# Patient Record
Sex: Male | Born: 1950 | Race: White | Hispanic: No | Marital: Married | Smoking: Former smoker
Health system: Southern US, Community
[De-identification: ages and names within clinical notes are randomized; demographics above are authoritative.]

## PROBLEM LIST (undated history)

## (undated) DIAGNOSIS — I10 Essential (primary) hypertension: Secondary | ICD-10-CM

## (undated) DIAGNOSIS — I4891 Unspecified atrial fibrillation: Secondary | ICD-10-CM

## (undated) DIAGNOSIS — I495 Sick sinus syndrome: Secondary | ICD-10-CM

## (undated) DIAGNOSIS — Z8709 Personal history of other diseases of the respiratory system: Secondary | ICD-10-CM

## (undated) DIAGNOSIS — N189 Chronic kidney disease, unspecified: Secondary | ICD-10-CM

## (undated) DIAGNOSIS — C4490 Unspecified malignant neoplasm of skin, unspecified: Secondary | ICD-10-CM

## (undated) DIAGNOSIS — Z9289 Personal history of other medical treatment: Secondary | ICD-10-CM

## (undated) DIAGNOSIS — N281 Cyst of kidney, acquired: Secondary | ICD-10-CM

## (undated) DIAGNOSIS — J45909 Unspecified asthma, uncomplicated: Secondary | ICD-10-CM

## (undated) DIAGNOSIS — H3530 Unspecified macular degeneration: Secondary | ICD-10-CM

## (undated) DIAGNOSIS — M5136 Other intervertebral disc degeneration, lumbar region: Secondary | ICD-10-CM

## (undated) DIAGNOSIS — E119 Type 2 diabetes mellitus without complications: Secondary | ICD-10-CM

## (undated) DIAGNOSIS — M109 Gout, unspecified: Secondary | ICD-10-CM

## (undated) DIAGNOSIS — Z950 Presence of cardiac pacemaker: Secondary | ICD-10-CM

## (undated) DIAGNOSIS — K219 Gastro-esophageal reflux disease without esophagitis: Secondary | ICD-10-CM

## (undated) DIAGNOSIS — I499 Cardiac arrhythmia, unspecified: Secondary | ICD-10-CM

## (undated) DIAGNOSIS — E7800 Pure hypercholesterolemia, unspecified: Secondary | ICD-10-CM

## (undated) DIAGNOSIS — M51369 Other intervertebral disc degeneration, lumbar region without mention of lumbar back pain or lower extremity pain: Secondary | ICD-10-CM

## (undated) DIAGNOSIS — R911 Solitary pulmonary nodule: Secondary | ICD-10-CM

## (undated) DIAGNOSIS — M5126 Other intervertebral disc displacement, lumbar region: Secondary | ICD-10-CM

## (undated) DIAGNOSIS — Z8719 Personal history of other diseases of the digestive system: Secondary | ICD-10-CM

## (undated) DIAGNOSIS — I739 Peripheral vascular disease, unspecified: Secondary | ICD-10-CM

## (undated) HISTORY — PX: CATARACT EXTRACTION, BILATERAL: SHX1313

## (undated) HISTORY — DX: Unspecified atrial fibrillation: I48.91

## (undated) HISTORY — PX: WRIST SURGERY: SHX841

## (undated) HISTORY — DX: Essential (primary) hypertension: I10

## (undated) HISTORY — PX: MELANOMA EXCISION: SHX5266

## (undated) HISTORY — DX: Solitary pulmonary nodule: R91.1

## (undated) HISTORY — DX: Type 2 diabetes mellitus without complications: E11.9

## (undated) HISTORY — PX: LACERATION REPAIR: SHX5168

## (undated) HISTORY — DX: Sick sinus syndrome: I49.5

---

## 2005-05-05 ENCOUNTER — Ambulatory Visit: Payer: Self-pay | Admitting: Cardiology

## 2005-06-03 ENCOUNTER — Ambulatory Visit: Payer: Self-pay | Admitting: Cardiology

## 2005-11-11 ENCOUNTER — Ambulatory Visit: Payer: Self-pay | Admitting: Cardiology

## 2012-01-11 HISTORY — PX: ESOPHAGEAL DILATION: SHX303

## 2012-02-02 ENCOUNTER — Other Ambulatory Visit: Payer: Self-pay | Admitting: Physician Assistant

## 2012-02-03 ENCOUNTER — Encounter: Payer: Self-pay | Admitting: Cardiology

## 2012-02-03 DIAGNOSIS — R55 Syncope and collapse: Secondary | ICD-10-CM

## 2012-02-07 ENCOUNTER — Other Ambulatory Visit: Payer: Self-pay | Admitting: *Deleted

## 2012-02-07 ENCOUNTER — Telehealth: Payer: Self-pay | Admitting: *Deleted

## 2012-02-07 DIAGNOSIS — R55 Syncope and collapse: Secondary | ICD-10-CM

## 2012-02-07 NOTE — Telephone Encounter (Signed)
Address,phone numbers and insurance information verified with patient.

## 2012-02-07 NOTE — Telephone Encounter (Signed)
Left message for patient to call office to verify insurance/address information for 21 day monitor.

## 2012-02-09 DIAGNOSIS — R55 Syncope and collapse: Secondary | ICD-10-CM

## 2012-02-29 ENCOUNTER — Encounter: Payer: Self-pay | Admitting: Cardiology

## 2012-03-02 ENCOUNTER — Telehealth: Payer: Self-pay | Admitting: *Deleted

## 2012-03-02 ENCOUNTER — Ambulatory Visit (INDEPENDENT_AMBULATORY_CARE_PROVIDER_SITE_OTHER): Payer: BC Managed Care – PPO | Admitting: Physician Assistant

## 2012-03-02 ENCOUNTER — Encounter: Payer: Self-pay | Admitting: Physician Assistant

## 2012-03-02 VITALS — BP 144/82 | HR 86 | Ht 73.0 in | Wt 204.0 lb

## 2012-03-02 DIAGNOSIS — I4891 Unspecified atrial fibrillation: Secondary | ICD-10-CM

## 2012-03-02 DIAGNOSIS — R911 Solitary pulmonary nodule: Secondary | ICD-10-CM

## 2012-03-02 DIAGNOSIS — I10 Essential (primary) hypertension: Secondary | ICD-10-CM

## 2012-03-02 DIAGNOSIS — E119 Type 2 diabetes mellitus without complications: Secondary | ICD-10-CM

## 2012-03-02 NOTE — Progress Notes (Signed)
HPI: Patient presents for post hospital followup from [facility], following recent evaluation by myself/Dr. Andee Lineman for symptomatic bradycardia and near syncope. He presented with AF CVR, with prior history of PAF, and no known CAD. He a negative GXT echo 2003, and had never undergone cardiac catheterization. He was on chronic Coumadin, followed by Dr. Olena Leatherwood. He presented with reported heart rate of 42 BPM, in Dr. Bartholomew Crews office, but with heart rate 67 on admission. We recommended discontinuation of Lopressor and diltiazem, with outpatient 21 day event monitor and. A 2-D echo was done prior to discharge, revealing normal LVF (EF 55-60%), with no focal WMAs, mild/moderate LVD, and mild MR.  Current review of continuous CardioNet monitor rhythm strips reveals persistent AF with rates as high as 170 bpm, but no bradycardia or significant pauses.  Clinically, he reports less frequent episodes of near-syncope, but still has frequent palpitations. However, he has not had to use diltiazem 30 mg as needed, per Dr. Margarita Mail recent instructions. He continues to experience easy fatigability and diminished energy, but with no associated CP. As noted, he has no known CAD. He denies any frank syncopal episodes, but still has occasional lightheadedness.  EKG today indicates atrial fibrillation at approximately 90 bpm.  Allergies  Allergen Reactions  . Pseudoephedrine   . Shellfish Allergy     Current Outpatient Prescriptions  Medication Sig Dispense Refill  . allopurinol (ZYLOPRIM) 300 MG tablet Take 300 mg by mouth daily.      Marland Kitchen amLODipine-olmesartan (AZOR) 5-20 MG per tablet Take 1 tablet by mouth daily.      Marland Kitchen atorvastatin (LIPITOR) 10 MG tablet Take 10 mg by mouth daily.      . benazepril-hydrochlorthiazide (LOTENSIN HCT) 20-12.5 MG per tablet Take 1 tablet by mouth daily.      . Cinnamon 500 MG TABS Take 2 tablets by mouth daily.      Marland Kitchen diltiazem (CARDIZEM) 30 MG tablet Take 1 tablet by mouth as needed.        Marland Kitchen glipiZIDE (GLUCOTROL XL) 2.5 MG 24 hr tablet Take 2.5 mg by mouth daily.      . metFORMIN (GLUCOPHAGE) 500 MG tablet Take 500 mg by mouth daily with breakfast.      . Omega-3 Fatty Acids (FISH OIL) 1000 MG CAPS Take 2 capsules by mouth daily.      . pantoprazole (PROTONIX) 20 MG tablet Take 20 mg by mouth daily.      Marland Kitchen warfarin (COUMADIN) 5 MG tablet Take 5 mg by mouth daily. As Directed.        Past Medical History  Diagnosis Date  . Bradycardia   . Atrial fibrillation   . Type 2 diabetes mellitus   . HTN (hypertension)   . Gout   . Pulmonary nodule     Past Surgical History  Procedure Date  . Left arm surgery     History   Social History  . Marital Status: Married    Spouse Name: N/A    Number of Children: N/A  . Years of Education: N/A   Occupational History  . Not on file.   Social History Main Topics  . Smoking status: Former Smoker -- 1.0 packs/day for 3 years    Types: Cigarettes    Quit date: 09/13/1991  . Smokeless tobacco: Former Neurosurgeon    Types: Chew    Quit date: 09/12/1996  . Alcohol Use: No  . Drug Use: Not on file  . Sexually Active: Not on file   Other  Topics Concern  . Not on file   Social History Narrative  . No narrative on file   Social History Narrative  . No narrative on file    Problem Relation Age of Onset  . Lung cancer Father   . Ulcers Mother     ROS: no nausea, vomiting; no fever, chills; no melena, hematochezia; no claudication  PHYSICAL EXAM: BP 144/82  Pulse 86  Ht 6\' 1"  (1.854 m)  Wt 204 lb (92.534 kg)  BMI 26.91 kg/m2 GENERAL: 61 year-old male, sitting upright; NAD HEENT: NCAT, PERRLA, EOMI; sclera clear; no xanthelasma NECK: palpable bilateral carotid pulses, no bruits; no JVD; no TM LUNGS: CTA bilaterally CARDIAC: Irregularly irregular (S1, S2); no significant murmurs; no rubs or gallops ABDOMEN: soft, non-tender; intact BS EXTREMETIES: intact distal pulses; no significant peripheral edema SKIN: warm/dry; no  obvious rash/lesions MUSCULOSKELETAL: no joint deformity NEURO: no focal deficit; NL affect   EKG: reviewed and available in Electronic Records   ASSESSMENT & PLAN:  Atrial fibrillation 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 Of note, however, we do not know the duration of his atrial fibrillation, but that he did present with history of PAF. Clearly, he has remained in A. fib since his recent hospitalization on May 23. Since we discontinued both beta blocker and calcium channel blocker, he has demonstrated persistently elevated heart rates up to 170 bpm. He currently has normal LVF, but is at risk for tachycardia mediated cardiomyopathy. Therefore, it would be preferable to cardiovert him back in to NSR. Following consultation with Dr. Andee Lineman, and with the patient, the plan is to refer him to our EP team for early evaluation, regarding feasibility and timing possible cardioversion as well as permanent pacemaker implantation, followed by rate controlling agents. As noted, patient is on chronic Coumadin, followed by Dr. Olena Leatherwood. In the interim, patient is to continue with our plan to use diltiazem 30 mg only on a prn basis, for symptomatic tachycardia palpitations.   HTN (hypertension) Followed by primary M.D.  Type 2 diabetes mellitus Followed by primary M.D.  Pulmonary nodule Patient has been advised to followup with Dr. Olena Leatherwood regarding further workup.    Gene Kyana Aicher, PAC

## 2012-03-02 NOTE — Assessment & Plan Note (Signed)
Followed by primary M.D. 

## 2012-03-02 NOTE — Patient Instructions (Signed)
   Referral to EP for evaluation for pacemaker Follow up in  1 month

## 2012-03-02 NOTE — Assessment & Plan Note (Signed)
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 Of note, however, we do not know the duration of his atrial fibrillation, but that he did present with history of PAF. Clearly, he has remained in A. fib since his recent hospitalization on May 23. Since we discontinued both beta blocker and calcium channel blocker, he has demonstrated persistently elevated heart rates up to 170 bpm. He currently has normal LVF, but is at risk for tachycardia mediated cardiomyopathy. Therefore, it would be preferable to cardiovert him back in to NSR. Following consultation with Dr. Andee Lineman, and with the patient, the plan is to refer him to our EP team for early evaluation, regarding feasibility and timing possible cardioversion as well as permanent pacemaker implantation, followed by rate controlling agents. As noted, patient is on chronic Coumadin, followed by Dr. Olena Leatherwood. In the interim, patient is to continue with our plan to use diltiazem 30 mg only on a prn basis, for symptomatic tachycardia palpitations.

## 2012-03-02 NOTE — Assessment & Plan Note (Signed)
Patient has been advised to followup with Dr. Olena Leatherwood regarding further workup.

## 2012-03-02 NOTE — Telephone Encounter (Signed)
Referral to EP for evaluation for pacemaker Per check out from [place] office today. Mr. Duggar saw Gene Serpe PA-C. Gene and Dr. Andee Lineman are requesting That Mr. Cundari be seen next week.  I spoke with Marlowe Kays and she advised check with you.

## 2012-03-07 NOTE — Telephone Encounter (Signed)
Per Dr Paul Dunn add on to his schedule in [place] on 03/29/12

## 2012-03-14 NOTE — Telephone Encounter (Signed)
[facility]ach[facility] T More Detail >>      Megan Salon        Sent: Wed March 14, 2012 12:23 PM    To: Lesle Chris, LPN        SYED ZUKAS    MRN: [id number] DOB: 06-Apr-1951     Pt Work: [phone] Pt Home: [phone]           Message     Yes patient has been notified of his upcoming appointment    With Dr. Johney Frame.

## 2012-03-14 NOTE — Telephone Encounter (Signed)
Has this patient been notified. °

## 2012-03-29 ENCOUNTER — Encounter: Payer: Self-pay | Admitting: *Deleted

## 2012-03-29 ENCOUNTER — Other Ambulatory Visit: Payer: Self-pay | Admitting: *Deleted

## 2012-03-29 ENCOUNTER — Encounter: Payer: Self-pay | Admitting: Internal Medicine

## 2012-03-29 ENCOUNTER — Encounter (HOSPITAL_COMMUNITY): Payer: Self-pay | Admitting: Pharmacy Technician

## 2012-03-29 ENCOUNTER — Ambulatory Visit (INDEPENDENT_AMBULATORY_CARE_PROVIDER_SITE_OTHER): Payer: BC Managed Care – PPO | Admitting: Internal Medicine

## 2012-03-29 VITALS — BP 128/83 | HR 78 | Ht 73.0 in | Wt 201.1 lb

## 2012-03-29 DIAGNOSIS — I10 Essential (primary) hypertension: Secondary | ICD-10-CM

## 2012-03-29 DIAGNOSIS — I495 Sick sinus syndrome: Secondary | ICD-10-CM

## 2012-03-29 DIAGNOSIS — I4891 Unspecified atrial fibrillation: Secondary | ICD-10-CM

## 2012-03-29 NOTE — Patient Instructions (Addendum)
Your physician has recommended that you have a pacemaker inserted. A pacemaker is a small device that is placed under the skin of your chest or abdomen to help control abnormal heart rhythms. This device uses electrical pulses to prompt the heart to beat at a normal rate. Pacemakers are used to treat heart rhythms that are too slow. Wire (leads) are attached to the pacemaker that goes into the chambers of you heart. This is done in the hospital and usually requires and overnight stay. Please see the instruction sheet given to you today for more information. You will receive a call from [place] to inform you about the specifics for this procedure. Your physician recommends that you continue on your current medications as directed. Please refer to the Current Medication list given to you today.

## 2012-04-02 ENCOUNTER — Encounter: Payer: Self-pay | Admitting: Internal Medicine

## 2012-04-02 DIAGNOSIS — I495 Sick sinus syndrome: Secondary | ICD-10-CM | POA: Insufficient documentation

## 2012-04-02 NOTE — Progress Notes (Signed)
Primary Care Physician: Toma Deiters, MD Referring Physician:  Dr Valeria Batman is a 61 y.o. male with a h/o persistent atrial fibrillation who presents today for EP consultation.  He has a longstanding history of atrial fibrillation for which he has been chronically anticoagulation with coumadin.  He reports initially presenting for routine evaluation and found to have afib.  Because he was asymptomatic, he was placed on metoprolol and diltiazem for rate control.  He did well with rate control for a long time, without any concerns.  He reports that he has been able to remain very active.  Over the past few months, he has developed symptoms of fatigue and dizziness.  He reports having presyncope.  He checked his heart rate and found it to be 40s.  He therefore presented to Dr Bartholomew Crews office and was confirmed to have afib with heart rates of 42 bpm.  He was therefore admitted to [facility] for further management.  His metoprolol and diltiazem were discontinued by Dr Andee Lineman.  He was discharged with an outpatient 21 day event monitor. He had a 2-D echo was done prior to discharge, revealing normal LVF (EF 55-60%), with no focal WMAs, mild/moderate LVD, and mild MR.  Since discharge, he reports frequent tachypalpitations and worsening fatigue.  His event monitor reveals afib with ventricular rates as high as 170 bpm, but no bradycardia or significant pauses off of AV nodal agents.  His dizziness and presyncope have also resolved off of AV nodal agents.  He has been instructed to use diltiazem 30 mg as needed and is referred to me for further assessment of tachycardia/bradycardia syndrome.   Today, he denies symptoms of chest pain, shortness of breath, orthopnea, PND, lower extremity edema, further dizziness, presyncope, syncope, or neurologic sequela. The patient is tolerating medications without difficulties and is otherwise without complaint today.   Past Medical History    Diagnosis Date  . Tachycardia-bradycardia syndrome   . Atrial fibrillation     long standing persistent  . Type 2 diabetes mellitus   . HTN (hypertension)   . Gout   . Pulmonary nodule    Past Surgical History  Procedure Date  . Left arm surgery     Current Outpatient Prescriptions  Medication Sig Dispense Refill  . allopurinol (ZYLOPRIM) 300 MG tablet Take 300 mg by mouth daily.      Marland Kitchen amLODipine-olmesartan (AZOR) 5-20 MG per tablet Take 1 tablet by mouth daily.      Marland Kitchen atorvastatin (LIPITOR) 10 MG tablet Take 10 mg by mouth daily.      . benazepril-hydrochlorthiazide (LOTENSIN HCT) 20-12.5 MG per tablet Take 1 tablet by mouth daily.      . Cinnamon 500 MG TABS Take 2 tablets by mouth daily.      Marland Kitchen diltiazem (CARDIZEM) 30 MG tablet Take 1 tablet by mouth daily.       Marland Kitchen glipiZIDE (GLUCOTROL XL) 2.5 MG 24 hr tablet Take 2.5 mg by mouth daily.      . metFORMIN (GLUCOPHAGE) 500 MG tablet Take 500 mg by mouth daily with breakfast.      . Omega-3 Fatty Acids (FISH OIL) 1000 MG CAPS Take 2 capsules by mouth daily.      . pantoprazole (PROTONIX) 20 MG tablet Take 20 mg by mouth daily.      Marland Kitchen warfarin (COUMADIN) 5 MG tablet Take 5 mg by mouth daily. As Directed.        Allergies  Allergen Reactions  .  Pseudoephedrine Other (See Comments)    Unknown    . Shellfish Allergy Nausea And Vomiting    History   Social History  . Marital Status: Married    Spouse Name: N/A    Number of Children: N/A  . Years of Education: N/A   Occupational History  . Not on file.   Social History Main Topics  . Smoking status: Former Smoker -- 1.0 packs/day for 3 years    Types: Cigarettes    Quit date: 09/13/1991  . Smokeless tobacco: Former Neurosurgeon    Types: Chew    Quit date: 09/12/1996  . Alcohol Use: No  . Drug Use: No  . Sexually Active: Not on file   Other Topics Concern  . Not on file   Social History Narrative   Lives with wife in [place].  He is the [employer] of [employer]    Family History  Problem Relation Age of Onset  . Lung cancer Father   . Ulcers Mother     ROS- All systems are reviewed and negative except as per the HPI above  Physical Exam: Filed Vitals:   03/29/12 1117  BP: 128/83  Pulse: 78  Height: 6\' 1"  (1.854 m)  Weight: 201 lb 1.9 oz (91.227 kg)  SpO2: 99%    GEN- The patient is well appearing, alert and oriented x 3 today.   Head- normocephalic, atraumatic Eyes-  Sclera clear, conjunctiva pink Ears- hearing intact Oropharynx- clear Neck- supple, Lungs- Clear to ausculation bilaterally, normal work of breathing Heart- irregular rate and rhythm, no murmurs, rubs or gallops, PMI not laterally displaced GI- soft, NT, ND, + BS Extremities- no clubbing, cyanosis, or edema MS- no significant deformity or atrophy Skin- no rash or lesion Psych- euthymic mood, full affect Neuro- strength and sensation are intact  EKG today reveals afib, V rate 78 bpm, QRS 90 mse, Qtc 401 Event monitor is reviewed  Assessment and Plan:

## 2012-04-02 NOTE — Assessment & Plan Note (Signed)
As above.

## 2012-04-02 NOTE — Assessment & Plan Note (Signed)
The patient has persistent atrial fibrillation.  He has been asymptomatic with afib previously, but has recently begun having difficulty with tachycardia/ bradycardia syndrome.  He is appropriately anticoagulated with coumadin (CHADS2 score is at least 2).  Unfortunately with recent symptomatic bradycardia his AV nodal agents have had to be discontinued.  Now off of these medicines, he has high heart rates documented with palpitations, fatigue,and decreased exercise tolerance. Therapeutic strategies for afib including rate and rhythm control were discussed in detail with the patient today.  As he has done well for so long with rate control, I suspect that this may be a reasonable strategy long term.  We must however achieve appropriate rate control.  Currently, our ability to rate control this patient is limited by symptomatic bradycardia.  I would therefore recommend pacemaker implantation at this time.    Risks, benefits, alternatives to pacemaker implantation were discussed in detail with the patient today. The patient understands that the risks include but are not limited to bleeding, infection, pneumothorax, perforation, tamponade, vascular damage, renal failure, MI, stroke, death,  and lead dislodgement and wishes to proceed. We will therefore schedule the procedure at the next available time.   I will plan cardioversion at the time of pacemaker implantation.  We will see how long he maintains sinus rhythm and also assess his symptoms in sinus.  If he feels much better with sinus, then we may be aggressive in our pursuit of sinus rhythm long term.  If he returns to afib but with less symptoms with adequate rate control, then we may opt to use his prior AV nodal regimen long term.

## 2012-04-02 NOTE — Assessment & Plan Note (Signed)
Stable No change required today  

## 2012-04-03 ENCOUNTER — Telehealth: Payer: Self-pay

## 2012-04-03 NOTE — Telephone Encounter (Signed)
Checking percert for Cath set for 04-12-2012

## 2012-04-03 NOTE — Telephone Encounter (Signed)
No precert required for [employer] 

## 2012-04-05 ENCOUNTER — Other Ambulatory Visit: Payer: Self-pay | Admitting: *Deleted

## 2012-04-05 DIAGNOSIS — R0602 Shortness of breath: Secondary | ICD-10-CM

## 2012-04-05 DIAGNOSIS — Z01810 Encounter for preprocedural cardiovascular examination: Secondary | ICD-10-CM

## 2012-04-05 DIAGNOSIS — I10 Essential (primary) hypertension: Secondary | ICD-10-CM

## 2012-04-05 DIAGNOSIS — I4891 Unspecified atrial fibrillation: Secondary | ICD-10-CM

## 2012-04-05 LAB — PROTIME-INR

## 2012-04-11 MED ORDER — SODIUM CHLORIDE 0.9 % IJ SOLN: 3.0000 mL | Freq: Two times a day (BID) | INTRAMUSCULAR | Status: DC

## 2012-04-11 MED ORDER — CEFAZOLIN SODIUM-DEXTROSE 2-3 GM-% IV SOLR
2.0000 g | INTRAVENOUS | Status: DC
  Filled 2012-04-11 (×2): qty 50

## 2012-04-11 MED ORDER — SODIUM CHLORIDE 0.9 % IR SOLN
80.0000 mg | Status: DC
  Filled 2012-04-11 (×2): qty 2

## 2012-04-11 MED ORDER — CHLORHEXIDINE GLUCONATE 4 % EX LIQD
60.0000 mL | Freq: Once | CUTANEOUS | Status: DC
  Filled 2012-04-11: qty 60

## 2012-04-11 MED ORDER — SODIUM CHLORIDE 0.9 % IV SOLN: 250.0000 mL | INTRAVENOUS | Status: DC

## 2012-04-11 MED ORDER — SODIUM CHLORIDE 0.45 % IV SOLN
INTRAVENOUS | Status: DC
  Administered 2012-04-12: 50 mL/h via INTRAVENOUS

## 2012-04-11 MED ORDER — SODIUM CHLORIDE 0.9 % IJ SOLN: 3.0000 mL | INTRAMUSCULAR | Status: DC | PRN

## 2012-04-12 ENCOUNTER — Encounter (HOSPITAL_COMMUNITY): Payer: Self-pay | Admitting: General Practice

## 2012-04-12 ENCOUNTER — Encounter (HOSPITAL_COMMUNITY)
Admission: RE | Disposition: A | Discharge status: Home / Self Care | Payer: Self-pay | Source: Ambulatory Visit | Attending: Internal Medicine

## 2012-04-12 ENCOUNTER — Ambulatory Visit (HOSPITAL_COMMUNITY)
Admission: RE | Admit: 2012-04-12 | Discharge: 2012-04-13 | Disposition: A | Discharge status: Home / Self Care | Payer: BC Managed Care – PPO | Source: Ambulatory Visit | Attending: Internal Medicine | Admitting: Internal Medicine

## 2012-04-12 ENCOUNTER — Ambulatory Visit: Payer: BC Managed Care – PPO | Admitting: Cardiology

## 2012-04-12 DIAGNOSIS — I495 Sick sinus syndrome: Secondary | ICD-10-CM | POA: Insufficient documentation

## 2012-04-12 DIAGNOSIS — I4891 Unspecified atrial fibrillation: Secondary | ICD-10-CM | POA: Insufficient documentation

## 2012-04-12 DIAGNOSIS — M109 Gout, unspecified: Secondary | ICD-10-CM | POA: Insufficient documentation

## 2012-04-12 DIAGNOSIS — I10 Essential (primary) hypertension: Secondary | ICD-10-CM | POA: Insufficient documentation

## 2012-04-12 DIAGNOSIS — Z79899 Other long term (current) drug therapy: Secondary | ICD-10-CM | POA: Insufficient documentation

## 2012-04-12 DIAGNOSIS — E119 Type 2 diabetes mellitus without complications: Secondary | ICD-10-CM | POA: Insufficient documentation

## 2012-04-12 DIAGNOSIS — Z7901 Long term (current) use of anticoagulants: Secondary | ICD-10-CM | POA: Insufficient documentation

## 2012-04-12 DIAGNOSIS — Z7902 Long term (current) use of antithrombotics/antiplatelets: Secondary | ICD-10-CM | POA: Insufficient documentation

## 2012-04-12 HISTORY — DX: Pure hypercholesterolemia, unspecified: E78.00

## 2012-04-12 HISTORY — PX: PACEMAKER INSERTION: SHX728

## 2012-04-12 HISTORY — DX: Unspecified asthma, uncomplicated: J45.909

## 2012-04-12 HISTORY — DX: Gastro-esophageal reflux disease without esophagitis: K21.9

## 2012-04-12 HISTORY — DX: Cyst of kidney, acquired: N28.1

## 2012-04-12 HISTORY — DX: Personal history of other medical treatment: Z92.89

## 2012-04-12 HISTORY — PX: PERMANENT PACEMAKER INSERTION: SHX5480

## 2012-04-12 HISTORY — DX: Personal history of other diseases of the respiratory system: Z87.09

## 2012-04-12 HISTORY — DX: Peripheral vascular disease, unspecified: I73.9

## 2012-04-12 HISTORY — DX: Gout, unspecified: M10.9

## 2012-04-12 HISTORY — DX: Personal history of other diseases of the digestive system: Z87.19

## 2012-04-12 LAB — GLUCOSE, CAPILLARY: Glucose-Capillary: 117 mg/dL — ABNORMAL HIGH (ref 70–99)

## 2012-04-12 SURGERY — PERMANENT PACEMAKER INSERTION
Anesthesia: LOCAL

## 2012-04-12 MED ORDER — FENTANYL CITRATE 0.05 MG/ML IJ SOLN
INTRAMUSCULAR | Status: AC
  Filled 2012-04-12: qty 2

## 2012-04-12 MED ORDER — AMLODIPINE BESYLATE 5 MG PO TABS
5.0000 mg | ORAL_TABLET | Freq: Every day | ORAL | Status: DC
  Administered 2012-04-13: 09:00:00 5 mg via ORAL
  Filled 2012-04-12: qty 1

## 2012-04-12 MED ORDER — GLIPIZIDE ER 2.5 MG PO TB24
2.5000 mg | ORAL_TABLET | Freq: Every day | ORAL | Status: DC
  Administered 2012-04-13: 2.5 mg via ORAL
  Filled 2012-04-12 (×2): qty 1

## 2012-04-12 MED ORDER — CEFAZOLIN SODIUM 1-5 GM-% IV SOLN
1.0000 g | Freq: Four times a day (QID) | INTRAVENOUS | Status: AC
  Administered 2012-04-12 – 2012-04-13 (×3): 1 g via INTRAVENOUS
  Filled 2012-04-12 (×3): qty 50

## 2012-04-12 MED ORDER — AMLODIPINE-OLMESARTAN 5-20 MG PO TABS: 1.0000 | ORAL_TABLET | Freq: Every day | ORAL | Status: DC

## 2012-04-12 MED ORDER — ATORVASTATIN CALCIUM 10 MG PO TABS
10.0000 mg | ORAL_TABLET | Freq: Every day | ORAL | Status: DC
  Filled 2012-04-12 (×2): qty 1

## 2012-04-12 MED ORDER — SODIUM CHLORIDE 0.9 % IV SOLN: 250.0000 mL | INTRAVENOUS | Status: DC | PRN

## 2012-04-12 MED ORDER — SODIUM CHLORIDE 0.9 % IJ SOLN: 3.0000 mL | Freq: Two times a day (BID) | INTRAMUSCULAR | Status: DC

## 2012-04-12 MED ORDER — PANTOPRAZOLE SODIUM 20 MG PO TBEC
20.0000 mg | DELAYED_RELEASE_TABLET | Freq: Every day | ORAL | Status: DC
  Administered 2012-04-13: 09:00:00 20 mg via ORAL
  Filled 2012-04-12: qty 1

## 2012-04-12 MED ORDER — ONDANSETRON HCL 4 MG/2ML IJ SOLN: 4.0000 mg | Freq: Four times a day (QID) | INTRAMUSCULAR | Status: DC | PRN

## 2012-04-12 MED ORDER — ACETAMINOPHEN 325 MG PO TABS: 325.0000 mg | ORAL_TABLET | ORAL | Status: DC | PRN

## 2012-04-12 MED ORDER — SODIUM CHLORIDE 0.9 % IJ SOLN: 3.0000 mL | INTRAMUSCULAR | Status: DC | PRN

## 2012-04-12 MED ORDER — LIDOCAINE HCL (PF) 1 % IJ SOLN
INTRAMUSCULAR | Status: AC
  Filled 2012-04-12: qty 60

## 2012-04-12 MED ORDER — MIDAZOLAM HCL 5 MG/5ML IJ SOLN
INTRAMUSCULAR | Status: AC
  Filled 2012-04-12: qty 5

## 2012-04-12 MED ORDER — METFORMIN HCL 500 MG PO TABS
500.0000 mg | ORAL_TABLET | Freq: Every day | ORAL | Status: DC
  Administered 2012-04-13: 09:00:00 500 mg via ORAL
  Filled 2012-04-12 (×2): qty 1

## 2012-04-12 MED ORDER — MUPIROCIN 2 % EX OINT
TOPICAL_OINTMENT | Freq: Once | CUTANEOUS | Status: AC
  Administered 2012-04-12: 1 via NASAL
  Filled 2012-04-12 (×2): qty 22

## 2012-04-12 MED ORDER — IRBESARTAN 150 MG PO TABS
150.0000 mg | ORAL_TABLET | Freq: Every day | ORAL | Status: DC
  Administered 2012-04-13: 150 mg via ORAL
  Filled 2012-04-12: qty 1

## 2012-04-12 MED ORDER — WARFARIN - PHYSICIAN DOSING INPATIENT: Freq: Every day | Status: DC

## 2012-04-12 MED ORDER — PNEUMOCOCCAL VAC POLYVALENT 25 MCG/0.5ML IJ INJ
0.5000 mL | INJECTION | INTRAMUSCULAR | Status: DC
  Filled 2012-04-12: qty 0.5

## 2012-04-12 MED ORDER — ALLOPURINOL 300 MG PO TABS
300.0000 mg | ORAL_TABLET | Freq: Every day | ORAL | Status: DC
  Administered 2012-04-13: 300 mg via ORAL
  Filled 2012-04-12: qty 1

## 2012-04-12 MED ORDER — SODIUM CHLORIDE 0.9 % IR SOLN
Freq: Once | Status: DC
  Filled 2012-04-12: qty 2

## 2012-04-12 MED ORDER — WARFARIN SODIUM 5 MG PO TABS
5.0000 mg | ORAL_TABLET | Freq: Every day | ORAL | Status: DC
  Administered 2012-04-12: 21:00:00 5 mg via ORAL
  Filled 2012-04-12 (×2): qty 1

## 2012-04-12 MED ORDER — HYDROCODONE-ACETAMINOPHEN 5-325 MG PO TABS
1.0000 | ORAL_TABLET | ORAL | Status: DC | PRN
  Administered 2012-04-12 – 2012-04-13 (×4): 2 via ORAL
  Filled 2012-04-12 (×4): qty 2

## 2012-04-12 NOTE — Op Note (Signed)
SURGEON:  Hillis Range, MD     PREPROCEDURE DIAGNOSIS:  Tachycardia bradycardia syndrome,  Persistent atrial fibrillation    POSTPROCEDURE DIAGNOSIS:  Tachycardia bradycardia syndrome,  Persistent atrial fibrillation     PROCEDURES:   1. Left upper extremity venography.   2. Pacemaker implantation.   3. Cardioversion (elective electrical)    INTRODUCTION: Paul Dunn is a 61 y.o. male  with a history of tachycardia bradycardia syndrome and persistent atrial fibrillationwho presents today for pacemaker implantation.  The patient reports intermittent episodes of dizziness and fatigue over the past few weeks.  Once his AV nodal agents were held, his heart rates with afib significantly increased and he developed tachypalpitations with ongoing fatigue.    The patient therefore presents today for pacemaker implantation.     DESCRIPTION OF PROCEDURE:  Informed written consent was obtained, and   the patient was brought to the electrophysiology lab in a fasting state.  The patient received IV versed and fentanyl as sedation for the procedure today.  The patients left chest was prepped and draped in the usual sterile fashion by the EP lab staff. The skin overlying the left deltopectoral region was infiltrated with lidocaine for local analgesia.  A 4-cm incision was made over the left deltopectoral region.  A left subcutaneous pacemaker pocket was fashioned using a combination of sharp and blunt dissection. Electrocautery was required to assure hemostasis.    Left Upper Extremity Venography: A venogram of the left upper extremity was performed, which revealed a small left cephalic vein, which emptied into a large left subclavian vein.  The left axillary vein was large in size.    RA/RV Lead Placement: The left axillary vein was therefore cannulated.  Through the left axillary vein, a [facility] model [phone] (serial number  [id number]) right atrial lead and a [facility] model 1948- [birth year] (serial  number  [id number]) right ventricular lead were advanced with fluoroscopic visualization into the right atrial appendage and right ventricular apex positions respectively.  The patient presented to the electrophysiology lab in atrial fibrillation.  He was cardioverted to sinus rhythm with cardioversion electrodes in the anterior/posterior thoracic configuration.  A single synchronized 360J shock successfully cardioverted the patient. Once in sinus rhythm, atrial lead P- waves measured 2 mV with impedance of 534 ohms and a threshold of 1.7 V at 0.4 msec.  Right ventricular lead R-waves measured 17 mV with an impedance of 806 ohms and a threshold of 0.4 V at 0.5 msec.  Both leads were secured to the pectoralis fascia using #2-0 silk over the suture sleeves.   Device Placement:  The leads were then connected to a [employer] DR RF model [id number] (serial number  [id number] ) pacemaker.  The pocket was irrigated with copious gentamicin solution.  The pacemaker was then placed into the pocket.  The pocket was then closed in 2 layers with 2.0 Vicryl suture for the subcutaneous and subcuticular layers.  Steri- Strips and a sterile dressing were then applied.  There were no early apparent complications.     CONCLUSIONS:   1. Successful implantation of a Industrial/product designer DR RF dual-chamber pacemaker for tachycardia bradycardia syndrome and persistent atrial fibrillation  2. Successful cardioversion to sinus rhythm  3. No early apparent complications.           Hillis Range, MD 04/12/2012 1:49 PM

## 2012-04-12 NOTE — Interval H&P Note (Signed)
History and Physical Interval Note:  04/12/2012 10:49 AM  Paul Dunn  has presented today for surgery, with the diagnosis of a-fib  The various methods of treatment have been discussed with the patient and family. After consideration of risks, benefits and other options for treatment, the patient has consented to  Procedure(s) (LRB): PERMANENT PACEMAKER INSERTION (N/A) as a surgical intervention .  The patient's history has been reviewed, patient examined, no change in status, stable for surgery.  I have reviewed the patient's chart and labs.  Questions were answered to the patient's satisfaction.     Hillis Range

## 2012-04-12 NOTE — H&P (View-Only) (Signed)
 Primary Care Physician: HASANAJ,XAJE A, MD Referring Physician:  Dr DeGent   Paul Dunn is a 61 y.o. male with a h/o persistent atrial fibrillation who presents today for EP consultation.  He has a longstanding history of atrial fibrillation for which he has been chronically anticoagulation with coumadin.  He reports initially presenting for routine evaluation and found to have afib.  Because he was asymptomatic, he was placed on metoprolol and diltiazem for rate control.  He did well with rate control for a long time, without any concerns.  He reports that he has been able to remain very active.  Over the past few months, he has developed symptoms of fatigue and dizziness.  He reports having presyncope.  He checked his heart rate and found it to be 40s.  He therefore presented to Dr Hasanaj's office and was confirmed to have afib with heart rates of 42 bpm.  He was therefore admitted to [facility] for further management.  His metoprolol and diltiazem were discontinued by Dr DeGent.  He was discharged with an outpatient 21 day event monitor. He had a 2-D echo was done prior to discharge, revealing normal LVF (EF 55-60%), with no focal WMAs, mild/moderate LVD, and mild MR.  Since discharge, he reports frequent tachypalpitations and worsening fatigue.  His event monitor reveals afib with ventricular rates as high as 170 bpm, but no bradycardia or significant pauses off of AV nodal agents.  His dizziness and presyncope have also resolved off of AV nodal agents.  He has been instructed to use diltiazem 30 mg as needed and is referred to me for further assessment of tachycardia/bradycardia syndrome.   Today, he denies symptoms of chest pain, shortness of breath, orthopnea, PND, lower extremity edema, further dizziness, presyncope, syncope, or neurologic sequela. The patient is tolerating medications without difficulties and is otherwise without complaint today.   Past Medical History    Diagnosis Date  . Tachycardia-bradycardia syndrome   . Atrial fibrillation     long standing persistent  . Type 2 diabetes mellitus   . HTN (hypertension)   . Gout   . Pulmonary nodule    Past Surgical History  Procedure Date  . Left arm surgery     Current Outpatient Prescriptions  Medication Sig Dispense Refill  . allopurinol (ZYLOPRIM) 300 MG tablet Take 300 mg by mouth daily.      . amLODipine-olmesartan (AZOR) 5-20 MG per tablet Take 1 tablet by mouth daily.      . atorvastatin (LIPITOR) 10 MG tablet Take 10 mg by mouth daily.      . benazepril-hydrochlorthiazide (LOTENSIN HCT) 20-12.5 MG per tablet Take 1 tablet by mouth daily.      . Cinnamon 500 MG TABS Take 2 tablets by mouth daily.      . diltiazem (CARDIZEM) 30 MG tablet Take 1 tablet by mouth daily.       . glipiZIDE (GLUCOTROL XL) 2.5 MG 24 hr tablet Take 2.5 mg by mouth daily.      . metFORMIN (GLUCOPHAGE) 500 MG tablet Take 500 mg by mouth daily with breakfast.      . Omega-3 Fatty Acids (FISH OIL) 1000 MG CAPS Take 2 capsules by mouth daily.      . pantoprazole (PROTONIX) 20 MG tablet Take 20 mg by mouth daily.      . warfarin (COUMADIN) 5 MG tablet Take 5 mg by mouth daily. As Directed.        Allergies  Allergen Reactions  .   Pseudoephedrine Other (See Comments)    Unknown    . Shellfish Allergy Nausea And Vomiting    History   Social History  . Marital Status: Married    Spouse Name: N/A    Number of Children: N/A  . Years of Education: N/A   Occupational History  . Not on file.   Social History Main Topics  . Smoking status: Former Smoker -- 1.0 packs/day for 3 years    Types: Cigarettes    Quit date: 09/13/1991  . Smokeless tobacco: Former User    Types: Chew    Quit date: 09/12/1996  . Alcohol Use: No  . Drug Use: No  . Sexually Active: Not on file   Other Topics Concern  . Not on file   Social History Narrative   Lives with wife in [place].  He is the Pastor of [employer]    Family History  Problem Relation Age of Onset  . Lung cancer Father   . Ulcers Mother     ROS- All systems are reviewed and negative except as per the HPI above  Physical Exam: Filed Vitals:   03/29/12 1117  BP: 128/83  Pulse: 78  Height: 6' 1" (1.854 m)  Weight: 201 lb 1.9 oz (91.227 kg)  SpO2: 99%    GEN- The patient is well appearing, alert and oriented x 3 today.   Head- normocephalic, atraumatic Eyes-  Sclera clear, conjunctiva pink Ears- hearing intact Oropharynx- clear Neck- supple, Lungs- Clear to ausculation bilaterally, normal work of breathing Heart- irregular rate and rhythm, no murmurs, rubs or gallops, PMI not laterally displaced GI- soft, NT, ND, + BS Extremities- no clubbing, cyanosis, or edema MS- no significant deformity or atrophy Skin- no rash or lesion Psych- euthymic mood, full affect Neuro- strength and sensation are intact  EKG today reveals afib, V rate 78 bpm, QRS 90 mse, Qtc 401 Event monitor is reviewed  Assessment and Plan:  

## 2012-04-13 ENCOUNTER — Ambulatory Visit (HOSPITAL_COMMUNITY): Payer: BC Managed Care – PPO

## 2012-04-13 ENCOUNTER — Encounter: Payer: Self-pay | Admitting: *Deleted

## 2012-04-13 DIAGNOSIS — Z950 Presence of cardiac pacemaker: Secondary | ICD-10-CM | POA: Insufficient documentation

## 2012-04-13 LAB — GLUCOSE, CAPILLARY: Glucose-Capillary: 108 mg/dL — ABNORMAL HIGH (ref 70–99)

## 2012-04-13 MED ORDER — HYDROCODONE-ACETAMINOPHEN 5-325 MG PO TABS: 1.0000 | ORAL_TABLET | Freq: Four times a day (QID) | ORAL | Status: AC | PRN

## 2012-04-13 MED ORDER — YOU HAVE A PACEMAKER BOOK
Freq: Once | Status: DC
  Filled 2012-04-13: qty 1

## 2012-04-13 NOTE — Plan of Care (Signed)
Problem: Consults Goal: Permanent Pacemaker Patient Education (See Patient Education module for education specifics.) Outcome: Progressing PT and wife watched Life with a pacemaker

## 2012-04-13 NOTE — Progress Notes (Signed)
   ELECTROPHYSIOLOGY ROUNDING NOTE    Patient Name: Paul Dunn Date of Encounter: 04-13-2012    SUBJECTIVE:Patient feels well.  No chest pain or shortness of breath.  Status post DCCV and dual chamber pacemaker implant 04-12-2012  TELEMETRY: Reviewed telemetry pt in atrial pacing with intrinsic ventricular conduction Filed Vitals:   04/13/12 0025 04/13/12 0415 04/13/12 0500 04/13/12 0600  BP:    119/72  Pulse:  62 60 78  Temp:    97.3 F (36.3 C)  TempSrc:    Oral  Resp:  11 9   Height:      Weight: 199 lb 1.2 oz (90.3 kg)     SpO2:  96% 97%     Intake/Output Summary (Last 24 hours) at 04/13/12 4098 Last data filed at 04/12/12 2057  Gross per 24 hour  Intake    410 ml  Output      0 ml  Net    410 ml    LABS: INR: 2.09  Radiology/Studies:  Final result pending, leads in stable position.  PHYSICAL EXAM Left chest with hematoma or ecchymosis  DEVICE INTERROGATION: Device interrogation pending.  Wound care, arm mobility, restrictions reviewed with patient.  Routine follow up scheduled.  Will schedule wound check with Rick Duff, PA to check on blood pressure.  Coumadin follow up in [place] office per patient request following DCCV.  Assessment/Plan per Dr Ladona Ridgel  EP Attending  Patient seen and examined. Agree with above. Plan for discharge with usual followup.   Lewayne Bunting, M.D.

## 2012-04-13 NOTE — Discharge Summary (Signed)
ELECTROPHYSIOLOGY PROCEDURE DISCHARGE SUMMARY    Patient ID: Paul Dunn,  MRN: [id number], DOB/AGE: 1951-01-01 61 y.o.  Admit date: 04/12/2012 Discharge date: 04/13/2012  Primary Care Physician: [facility]) Primary Cardiologist: Lewayne Bunting, MD Electrophysiologist: Hillis Range, MD  Primary Discharge Diagnosis:  Tachy brady syndrome status post cardioversion and pacemaker placement this admssion  Secondary Discharge Diagnosis:  1.  Diabetes 2.  Hypertension 3.  Gout 4.  Chronic anticoagulation with Warfarin  Procedures This Admission:  1.  Implantation of a dual chamber pacemaker on 04-12-2012 by Dr Johney Frame.  The patient received a STJ model number 2210 pacemaker with model number 2088 right atrial lead and model number 1948 right ventricular lead.  There were no early apparent complications. 2.  Direct current cardioversion on 04-12-2012 by Dr Johney Frame terminating atrial fibrillation resulting in atrial pacing. 3.  Chest x-ray on 04-13-2012 demonstrated no pneumothorax status post device implantation.   Brief HPI: Paul Dunn is a 61 y.o. male with a h/o persistent atrial fibrillation who presents today for EP consultation. He has a longstanding history of atrial fibrillation for which he has been chronically anticoagulation with coumadin. He reports initially presenting for routine evaluation and found to have afib. Because he was asymptomatic, he was placed on metoprolol and diltiazem for rate control. He did well with rate control for a long time, without any concerns. He reports that he has been able to remain very active. Over the past few months, he has developed symptoms of fatigue and dizziness. He reports having presyncope. He checked his heart rate and found it to be 40s. He therefore presented to Dr Bartholomew Crews office and was confirmed to have afib with heart rates of 42 bpm. He was therefore admitted to [facility] for further management. His metoprolol and diltiazem  were discontinued by Dr Andee Lineman. He was discharged with an outpatient 21 day event monitor. He had a 2-D echo was done prior to discharge, revealing normal LVF (EF 55-60%), with no focal WMAs, mild/moderate LVD, and mild MR. Since discharge, he reports frequent tachypalpitations and worsening fatigue. His event monitor reveals afib with ventricular rates as high as 170 bpm, but no bradycardia or significant pauses off of AV nodal agents. His dizziness and presyncope have also resolved off of AV nodal agents. He has been instructed to use diltiazem 30 mg as needed and is referred to me for further assessment of tachycardia/bradycardia syndrome. Because of symptomatic tachy brady syndrome, pacemaker implantation was recommended.  Risks, benefits, and alternatives were reviewed with the patient who wished to proceed.   Hospital Course:  The patient was admitted and underwent implantation of a dual chamber pacemaker and cardioversion with details as outlined above.   He was monitored on telemetry overnight which demonstrated atrial pacing with intrinsic ventricular conduction.  Left chest was without hematoma or ecchymosis.  The device was interrogated and found to be functioning normally.  CXR was obtained and demonstrated no pneumothorax status post device implantation.  Wound care, arm mobility, and restrictions were reviewed with the patient.  Dr Ladona Ridgel examined the patient and considered them stable for discharge to home. Because of moderate incisional pain, the patient was discharged with a prescription for Norco.   Discharge Vitals: Blood pressure 124/91, pulse 66, temperature 97 F (36.1 C), temperature source Oral, resp. rate 12, height 6\' 1"  (1.854 m), weight 199 lb 1.2 oz (90.3 kg), SpO2 97.00%.   Discharge Medications:  Medication List  As of 04/13/2012  9:55 AM  TAKE these medications         allopurinol 300 MG tablet   Commonly known as: ZYLOPRIM   Take 300 mg by mouth daily.       atorvastatin 10 MG tablet   Commonly known as: LIPITOR   Take 10 mg by mouth daily.      AZOR 5-20 MG per tablet   Generic drug: amLODipine-olmesartan   Take 1 tablet by mouth daily.      benazepril-hydrochlorthiazide 20-12.5 MG per tablet   Commonly known as: LOTENSIN HCT   Take 1 tablet by mouth daily.      Cinnamon 500 MG Tabs   Take 2 tablets by mouth daily.      diltiazem 30 MG tablet   Commonly known as: CARDIZEM   Take 1 tablet by mouth daily.      Fish Oil 1000 MG Caps   Take 2 capsules by mouth daily.      glipiZIDE 2.5 MG 24 hr tablet   Commonly known as: GLUCOTROL XL   Take 2.5 mg by mouth daily.      HYDROcodone-acetaminophen 5-325 MG per tablet   Commonly known as: NORCO/VICODIN   Take 1 tablet by mouth every 6 (six) hours as needed.      metFORMIN 500 MG tablet   Commonly known as: GLUCOPHAGE   Take 500 mg by mouth daily with breakfast.      pantoprazole 20 MG tablet   Commonly known as: PROTONIX   Take 20 mg by mouth daily.      warfarin 5 MG tablet   Commonly known as: COUMADIN   Take 5 mg by mouth daily. As Directed.            Disposition:  Discharge Orders    Future Appointments: Provider: Department: Dept Phone: Center:   04/20/2012 10:00 AM [facility] [phone] [facility]   04/23/2012 2:45 PM Minda Meo, PA-C [facility] [place] [phone] [facility]   07/16/2012 1:45 PM Hillis Range, MD [facility] [phone] [facility]     Future Orders Please Complete By Expires   Diet - low sodium heart healthy      Increase activity slowly      Discharge instructions      Comments:   Please see pacemaker discharge instructions     Follow-up Information    Follow up with [facility] on 04/20/2012. (At 10:00 AM for Coumadin follow-up)    Contact information:   [facility] office [address]  [address] [place] [address] [phone]       Follow up with  Rick Duff, PA-C on 04/23/2012. (At 2:45 PM for wound check)    Contact information:   [facility] [address] [place] [address] [phone]       Follow up with Hillis Range, MD on 07/16/2012. (At 1:45 PM)    Contact information:    [facility] [address] [place] [address] [phone]          Duration of Discharge Encounter: Greater than 30 minutes including physician time.  Signed, Gypsy Balsam, RN, BSN 04/13/2012, 9:55 AM   I have seen, examined the patient, and reviewed the above assessment and plan.  Changes to above are made where necessary.    Co Sign: Hillis Range, MD 04/18/2012 1:38 PM

## 2012-04-13 NOTE — Plan of Care (Signed)
Problem: Discharge Progression Outcomes Goal: No evidence of pacemaker malfunction Outcome: Completed/Met Date Met:  04/13/12 Interrogated by [facility] with no noted malfunction, a pacing during night Goal: Site without bleeding, hematoma, S/S infection Outcome: Completed/Met Date Met:  04/13/12 Old drainage marked unchanged from yesterday, dressing removed by PA and steri strips with no noted bleeding or drainage Goal: Discharge plan in place and appropriate Outcome: Completed/Met Date Met:  04/13/12 Discharge today as planned Goal: Tolerating diet Outcome: Completed/Met Date Met:  04/13/12 Tolerating diet with no nausea Goal: Activity appropriate for discharge plan Outcome: Completed/Met Date Met:  04/13/12 Ambulating in hall with no s/s intolerance

## 2012-04-13 NOTE — Progress Notes (Signed)
Utilization Review Completed.  

## 2012-04-13 NOTE — Plan of Care (Signed)
Problem: Consults Goal: Permanent Pacemaker Patient Education (See Patient Education module for education specifics.)  Outcome: Completed/Met Date Met:  04/13/12 Video viewed by patient and wife, questions answered, good verbalized understanding. [employer] and [facility] hospital booklets on pacemakers sent home with patient after viewed.   Problem: Phase II Progression Outcomes Goal: No evidence of pacemaker malfunction Outcome: Completed/Met Date Met:  04/13/12 Interrogated this morning and chest xray done, no pacemaker malfunction noted and leads in place.

## 2012-04-20 ENCOUNTER — Ambulatory Visit (INDEPENDENT_AMBULATORY_CARE_PROVIDER_SITE_OTHER): Payer: BC Managed Care – PPO | Admitting: *Deleted

## 2012-04-20 DIAGNOSIS — Z7901 Long term (current) use of anticoagulants: Secondary | ICD-10-CM

## 2012-04-20 DIAGNOSIS — I4891 Unspecified atrial fibrillation: Secondary | ICD-10-CM

## 2012-04-23 ENCOUNTER — Ambulatory Visit: Payer: BC Managed Care – PPO | Admitting: Internal Medicine

## 2012-04-23 ENCOUNTER — Encounter: Payer: Self-pay | Admitting: Internal Medicine

## 2012-04-23 ENCOUNTER — Encounter: Payer: Self-pay | Admitting: Cardiology

## 2012-04-23 ENCOUNTER — Ambulatory Visit (INDEPENDENT_AMBULATORY_CARE_PROVIDER_SITE_OTHER): Payer: BC Managed Care – PPO | Admitting: Cardiology

## 2012-04-23 ENCOUNTER — Telehealth: Payer: Self-pay | Admitting: Internal Medicine

## 2012-04-23 VITALS — BP 107/71 | HR 81 | Ht 73.0 in | Wt 200.0 lb

## 2012-04-23 DIAGNOSIS — I495 Sick sinus syndrome: Secondary | ICD-10-CM

## 2012-04-23 DIAGNOSIS — Z950 Presence of cardiac pacemaker: Secondary | ICD-10-CM

## 2012-04-23 LAB — PACEMAKER DEVICE OBSERVATION
AL IMPEDENCE PM: 550 Ohm
ATRIAL PACING PM: 6.5
RV LEAD IMPEDENCE PM: 750 Ohm
RV LEAD THRESHOLD: 0.5 V
VENTRICULAR PACING PM: 18

## 2012-04-23 NOTE — Telephone Encounter (Signed)
lmom for patient that his device has only paced about 6% of the time and that we have sent Dr Johney Frame a message if regards as to what to do with his medications with his afib  I let him know Dr Johney Frame not back until back until next week

## 2012-04-23 NOTE — Telephone Encounter (Signed)
New Problem:    Patient called in wanting to know if his pacemaker has gone off since it was implanted, and how many times.  Please call back.

## 2012-04-23 NOTE — Progress Notes (Signed)
Device check only. Wound check. See PaceArt report.

## 2012-04-27 ENCOUNTER — Telehealth: Payer: Self-pay | Admitting: *Deleted

## 2012-04-27 ENCOUNTER — Ambulatory Visit (INDEPENDENT_AMBULATORY_CARE_PROVIDER_SITE_OTHER): Payer: BC Managed Care – PPO | Admitting: *Deleted

## 2012-04-27 ENCOUNTER — Encounter: Payer: Self-pay | Admitting: *Deleted

## 2012-04-27 VITALS — BP 116/76 | HR 71 | Ht 73.0 in | Wt 201.4 lb

## 2012-04-27 DIAGNOSIS — I4891 Unspecified atrial fibrillation: Secondary | ICD-10-CM

## 2012-04-27 DIAGNOSIS — I495 Sick sinus syndrome: Secondary | ICD-10-CM

## 2012-04-27 DIAGNOSIS — Z7901 Long term (current) use of anticoagulants: Secondary | ICD-10-CM

## 2012-04-27 LAB — POCT INR: INR: 2.4

## 2012-04-27 MED ORDER — DILTIAZEM HCL ER COATED BEADS 120 MG PO CP24: 120.0000 mg | ORAL_CAPSULE | Freq: Every day | ORAL | Status: DC

## 2012-04-27 NOTE — Telephone Encounter (Signed)
Nona Dell, MD 04/27/2012 11:45 AM Pended  Patient seen for nurse visit today. I reviewed the recent records and telephone communications. He is a patient of Dr. Andee Lineman and Dr. Johney Frame with a history of atrial fibrillation and tachycardia-bradycardia syndrome status post recent [employer] Jude pacemaker placement earlier in August. Recent symptoms reviewed. It would appear that he has had recurrent atrial fibrillation with RVR based on reported elevated heart rates. He should no longer be having issues with bradycardia status post pacemaker placement, and it would seem that starting him back on a long-acting calcium channel blocker may be a reasonable next step. Would suggest initiating Cardizem CD 120 mg daily, and keep the scheduled visit with Dr. Johney Frame this coming Wednesday for further device interrogation. If he continues to have progressive symptoms in the interim, or dizziness with frank syncope, he should be seen more urgently

## 2012-04-27 NOTE — Progress Notes (Signed)
Patient presents to office today for INR check and informed nurse that on yesterday while doing yard work, he c/o elevated HR ranging 140,123 and 165. Patient said he felt tired and denies having CP or SOB. Patient stated that on Sunday he had an episode where he couldn't see in which he sat down for an hour and resolved on its own. Patient has take all medications as prescribed and not having any side effects.

## 2012-04-27 NOTE — Progress Notes (Signed)
Patient will be coming to see Allred on Wednesday @11 :00am.

## 2012-04-27 NOTE — Telephone Encounter (Signed)
Patient informed. 

## 2012-04-27 NOTE — Telephone Encounter (Signed)
Spoke with [place] [facility]/ Vicky, BP 116/76 p 71, denies cp/SOB. EKG reviewed by Dr Nahser/ DOD. Discussed episode from last week- problem of pushing mower and" losing site" / resolved after hour of rest and no further episodes. Request Dr Diona Browner see or PCP.

## 2012-04-27 NOTE — Progress Notes (Signed)
Left message for patient to call office. Patient will be notified of the below information in new phone note. See note for details.

## 2012-04-27 NOTE — Telephone Encounter (Signed)
DOD phone call, pt in clinic for INR/ c/o afib/ tachy symptoms. Asking if pt can be seen today, asked them to get bp/p/ekg/ and symptoms, will call back with results for further instruction. Agreed to plan.

## 2012-04-27 NOTE — Progress Notes (Signed)
Patient seen for nurse visit today. I reviewed the recent records and telephone communications. He is a patient of Dr. Andee Lineman and Dr. Johney Frame with a history of atrial fibrillation and tachycardia-bradycardia syndrome status post recent [employer]. Jude pacemaker placement earlier in August. Recent symptoms reviewed. It would appear that he has had recurrent atrial fibrillation with RVR based on reported elevated heart rates. He should no longer be having issues with bradycardia status post pacemaker placement, and it would seem that starting him back on a long-acting calcium channel blocker may be a reasonable next step. Would suggest initiating Cardizem CD 120 mg daily, and keep the scheduled visit with Dr. Johney Frame this coming Wednesday for further device interrogation. If he continues to have progressive symptoms in the interim, or dizziness with frank syncope, he should be seen more urgently.

## 2012-04-27 NOTE — Telephone Encounter (Signed)
Left message for patient to call office.  

## 2012-05-02 ENCOUNTER — Ambulatory Visit (INDEPENDENT_AMBULATORY_CARE_PROVIDER_SITE_OTHER): Payer: BC Managed Care – PPO | Admitting: Internal Medicine

## 2012-05-02 ENCOUNTER — Encounter: Payer: Self-pay | Admitting: Internal Medicine

## 2012-05-02 VITALS — BP 113/73 | HR 80 | Ht 73.0 in | Wt 201.0 lb

## 2012-05-02 DIAGNOSIS — I10 Essential (primary) hypertension: Secondary | ICD-10-CM

## 2012-05-02 DIAGNOSIS — I495 Sick sinus syndrome: Secondary | ICD-10-CM

## 2012-05-02 DIAGNOSIS — Z950 Presence of cardiac pacemaker: Secondary | ICD-10-CM

## 2012-05-02 DIAGNOSIS — I4891 Unspecified atrial fibrillation: Secondary | ICD-10-CM

## 2012-05-02 LAB — PACEMAKER DEVICE OBSERVATION
AL AMPLITUDE: 2.2 mv
BAMS-0001: 150 {beats}/min
BRDY-0002RV: 60 {beats}/min
DEVICE MODEL PM: 7377947
RV LEAD AMPLITUDE: 12 mv

## 2012-05-02 MED ORDER — DILTIAZEM HCL ER COATED BEADS 240 MG PO CP24: 240.0000 mg | ORAL_CAPSULE | Freq: Every day | ORAL | Status: DC

## 2012-05-02 NOTE — Patient Instructions (Addendum)
Your physician recommends that you schedule a follow-up appointment in: November 2013 with Allred. Your physician has recommended you make the following change in your medication: Increase diltiazem to 240 mg daily. You may take (2) of your 120 mg daily. Your new prescription has been sent to your pharmacy. All other medications will remain the same.

## 2012-05-05 ENCOUNTER — Encounter: Payer: Self-pay | Admitting: Internal Medicine

## 2012-05-05 DIAGNOSIS — I4891 Unspecified atrial fibrillation: Secondary | ICD-10-CM | POA: Insufficient documentation

## 2012-05-05 NOTE — Progress Notes (Signed)
PCP:  Toma Deiters, MD Primary Cardiologist:  Dr Andee Lineman  The patient presents today for routine electrophysiology followup.  Since his recent pacemaker implant, the patient reports doing very well.  He was cardioverted at time of implant but maintained sinus rhythm for only several days.  He reports that he felt "great" in sinus rhythm.  He developed palpitations upon returning to afib.  He restarted diltiazem 120mg  daily with some improvement in symptoms.  He has mild fatigue.  He also has soreness over the pacemaker pocket which is improving. Today, he denies symptoms of shortness of breath, orthopnea, PND, lower extremity edema, dizziness, presyncope, syncope, or neurologic sequela.  The patient feels that he is tolerating medications without difficulties and is otherwise without complaint today.   Past Medical History  Diagnosis Date  . Tachycardia-bradycardia syndrome     s/p PPM implant by Dr Johney Frame 04/12/12  . Atrial fibrillation     long standing persistent  . HTN (hypertension)   . Pulmonary nodule   . High cholesterol   . Peripheral vascular disease   . Asthma     "when I was a child"  . History of bronchitis     "used to get it twice/year; last time was ~ 2 yr ago" (04/12/2012)  . Type 2 diabetes mellitus   . History of blood transfusion ~ 1984  . H/O hiatal hernia   . GERD (gastroesophageal reflux disease)   . Kidney cysts     "never treated it"  . Gouty arthritis    Past Surgical History  Procedure Date  . Pacemaker insertion 04/12/2012    SJM Accent DR RF pacemaker implanted by Dr Johney Frame for tachy/brady syndrome  . Laceration repair ~ 1984    left forearm; "cut it w/a power saw"  . Melanoma excision ~ 2011    left posterior shoulder  . Esophageal dilation 01/2012    Current Outpatient Prescriptions  Medication Sig Dispense Refill  . allopurinol (ZYLOPRIM) 300 MG tablet Take 300 mg by mouth daily.      Marland Kitchen amLODipine-olmesartan (AZOR) 5-20 MG per tablet Take 1  tablet by mouth daily.      Marland Kitchen atorvastatin (LIPITOR) 10 MG tablet Take 10 mg by mouth daily.      . benazepril-hydrochlorthiazide (LOTENSIN HCT) 20-12.5 MG per tablet Take 1 tablet by mouth daily.      . Cinnamon 500 MG TABS Take 2 tablets by mouth daily.      Marland Kitchen diltiazem (CARDIZEM CD) 240 MG 24 hr capsule Take 1 capsule (240 mg total) by mouth daily.  30 capsule  6  . glipiZIDE (GLUCOTROL XL) 2.5 MG 24 hr tablet Take 2.5 mg by mouth daily.      . metFORMIN (GLUCOPHAGE) 500 MG tablet Take 500 mg by mouth daily with breakfast.      . Omega-3 Fatty Acids (FISH OIL) 1000 MG CAPS Take 2 capsules by mouth daily.      . pantoprazole (PROTONIX) 20 MG tablet Take 20 mg by mouth daily.      Marland Kitchen warfarin (COUMADIN) 5 MG tablet Take 5 mg by mouth daily. As Directed.        Allergies  Allergen Reactions  . Shellfish Allergy Nausea And Vomiting  . Pseudoephedrine Other (See Comments)    Unknown      History   Social History  . Marital Status: Married    Spouse Name: N/A    Number of Children: N/A  . Years of Education:  N/A   Occupational History  . Not on file.   Social History Main Topics  . Smoking status: Former Smoker -- 1.0 packs/day for 2 years    Types: Cigarettes    Quit date: 09/13/1991  . Smokeless tobacco: Former Neurosurgeon    Types: Chew    Quit date: 09/12/1996  . Alcohol Use: No  . Drug Use: No  . Sexually Active: Yes   Other Topics Concern  . Not on file   Social History Narrative   Lives with wife in [place].  He is the [place] [employer]     Physical Exam: Ceasar Mons Vitals:   05/02/12 1141  BP: 113/73  Pulse: 80  Height: 6\' 1"  (1.854 m)  Weight: 201 lb (91.173 kg)  SpO2: 98%    GEN- The patient is well appearing, alert and oriented x 3 today.   Head- normocephalic, atraumatic Eyes-  Sclera clear, conjunctiva pink Ears- hearing intact Oropharynx- clear Neck- supple, no JVP Lymph- no cervical lymphadenopathy Lungs- Clear to  ausculation bilaterally, normal work of breathing Chest- pacemaker pocket is well healed Heart- Regular rate and rhythm, no murmurs, rubs or gallops, PMI not laterally displaced GI- soft, NT, ND, + BS Extremities- no clubbing, cyanosis, or edema MS- no significant deformity or atrophy Skin- no rash or lesion Psych- euthymic mood, full affect Neuro- strength and sensation are intact  Pacemaker interrogation- reviewed in detail today,  See PACEART report  Assessment and Plan:

## 2012-05-05 NOTE — Assessment & Plan Note (Signed)
Normal pacemaker function See Pace Art report No changes today  

## 2012-05-05 NOTE — Assessment & Plan Note (Signed)
Stable If he decides to consider tikosyn, we would have to stop his hctz.

## 2012-05-05 NOTE — Assessment & Plan Note (Signed)
The patient has symptomatic longstanding persistent atrial fibrillation.  His symptoms typically are better with adequate rate control.  Recently, AV nodal agents were stopped due to advanced symptomatic bradycardia.  Now that he has a pacemaker, we can more adequately treat his afib. Therapeutic strategies for afib including rate control and rhythm control were discussed in detail with the patient today. At this point, I think that initiation of an antiarrhythmic medicine is very reasonable.  Given his longstanding persistent afib, I think that the most reasonable options would be tikosyn or amiodarone.  I would favor tikosyn.  He is considering but not ready to pursue hospitalization for tikosyn.  In the interim, he will continue coumadin.  I will increase cardizem today to 240mg  daily.  He will contact me if he decides to start tikosyn in the interim.  I will see him back in 1-2 months for further evaluation.

## 2012-07-16 ENCOUNTER — Encounter: Payer: Self-pay | Admitting: Internal Medicine

## 2012-07-16 ENCOUNTER — Ambulatory Visit (INDEPENDENT_AMBULATORY_CARE_PROVIDER_SITE_OTHER): Payer: BC Managed Care – PPO | Admitting: Internal Medicine

## 2012-07-16 ENCOUNTER — Encounter: Payer: BC Managed Care – PPO | Admitting: Internal Medicine

## 2012-07-16 VITALS — BP 129/82 | HR 76 | Ht 73.0 in | Wt 201.4 lb

## 2012-07-16 DIAGNOSIS — I4891 Unspecified atrial fibrillation: Secondary | ICD-10-CM

## 2012-07-16 DIAGNOSIS — I495 Sick sinus syndrome: Secondary | ICD-10-CM

## 2012-07-16 LAB — PACEMAKER DEVICE OBSERVATION
BRDY-0002RV: 60 {beats}/min
BRDY-0004RV: 130 {beats}/min
DEVICE MODEL PM: 7377947
RV LEAD AMPLITUDE: 12 mv
RV LEAD THRESHOLD: 0.375 V

## 2012-07-16 NOTE — Progress Notes (Signed)
PCP: Toma Deiters, MD  Paul Dunn is a 61 y.o. male who presents today for routine electrophysiology followup.  Since last being seen in our clinic, the patient reports doing very well.  He is very pleased with the results of his PPM implantation.  His exercise tolerance and fatigue are much improved.  He has recently been very active hunting without any symptoms.  Today, he denies symptoms of chest pain, shortness of breath,  lower extremity edema, dizziness, presyncope, or syncope.  The patient is otherwise without complaint today.   Past Medical History  Diagnosis Date  . Tachycardia-bradycardia syndrome     s/p PPM implant by Dr Johney Frame 04/12/12  . Atrial fibrillation     long standing persistent  . HTN (hypertension)   . Pulmonary nodule   . High cholesterol   . Peripheral vascular disease   . Asthma     "when I was a child"  . History of bronchitis     "used to get it twice/year; last time was ~ 2 yr ago" (04/12/2012)  . Type 2 diabetes mellitus   . History of blood transfusion ~ 1984  . H/O hiatal hernia   . GERD (gastroesophageal reflux disease)   . Kidney cysts     "never treated it"  . Gouty arthritis    Past Surgical History  Procedure Date  . Pacemaker insertion 04/12/2012    SJM Accent DR RF pacemaker implanted by Dr Johney Frame for tachy/brady syndrome  . Laceration repair ~ 1984    left forearm; "cut it w/a power saw"  . Melanoma excision ~ 2011    left posterior shoulder  . Esophageal dilation 01/2012    Current Outpatient Prescriptions  Medication Sig Dispense Refill  . allopurinol (ZYLOPRIM) 300 MG tablet Take 300 mg by mouth daily.      Marland Kitchen atorvastatin (LIPITOR) 10 MG tablet Take 10 mg by mouth daily.      . benazepril-hydrochlorthiazide (LOTENSIN HCT) 20-12.5 MG per tablet Take 1 tablet by mouth daily.      . Cinnamon 500 MG TABS Take 2 tablets by mouth daily.      Marland Kitchen diltiazem (CARDIZEM CD) 240 MG 24 hr capsule Take 1 capsule (240 mg total) by mouth daily.  30  capsule  6  . glipiZIDE (GLUCOTROL XL) 2.5 MG 24 hr tablet Take 2.5 mg by mouth daily.      . metFORMIN (GLUCOPHAGE) 500 MG tablet Take 500 mg by mouth daily with breakfast.      . Omega-3 Fatty Acids (FISH OIL) 1000 MG CAPS Take 2 capsules by mouth daily.      . pantoprazole (PROTONIX) 20 MG tablet Take 20 mg by mouth daily.      Marland Kitchen warfarin (COUMADIN) 5 MG tablet Take 5 mg by mouth daily. As Directed.        Physical Exam: Filed Vitals:   07/16/12 1346  BP: 129/82  Pulse: 76  Height: 6\' 1"  (1.854 m)  Weight: 201 lb 6.4 oz (91.354 kg)    GEN- The patient is well appearing, alert and oriented x 3 today.   Head- normocephalic, atraumatic Eyes-  Sclera clear, conjunctiva pink Ears- hearing intact Oropharynx- clear Lungs- Clear to ausculation bilaterally, normal work of breathing Chest- pacemaker pocket is well healed Heart- Regular rate and rhythm (paced) GI- soft, NT, ND, + BS Extremities- no clubbing, cyanosis, or edema  Pacemaker interrogation- reviewed in detail today,  See PACEART report  Assessment and Plan:  1. Bradycardia  Normal pacemaker function Programmed VVIR today to promote battery longevity See Arita Miss Art report  2. afib He presently is not interested in initiation of tikosyn or any other AAD.  He wishes to continue rate control Continue coumadin for anticoagulation

## 2012-07-16 NOTE — Patient Instructions (Signed)
Continue all current medications. Dr. Johney Frame - August 2014

## 2012-08-14 ENCOUNTER — Encounter: Payer: Self-pay | Admitting: Internal Medicine

## 2012-10-15 ENCOUNTER — Telehealth: Payer: Self-pay | Admitting: Internal Medicine

## 2012-10-15 NOTE — Telephone Encounter (Signed)
Follow Up     Returning a phone call regarding your pacemaker. Would like to speak to [place].

## 2012-10-16 MED ORDER — DILTIAZEM HCL ER COATED BEADS 360 MG PO CP24: 360.0000 mg | ORAL_CAPSULE | Freq: Every day | ORAL | Status: DC

## 2012-10-16 NOTE — Telephone Encounter (Signed)
Increase diltiazem to 360mg  one po daily.  Patient aware and RX to be faxed to [facility] in [place].

## 2013-04-16 ENCOUNTER — Other Ambulatory Visit: Payer: Self-pay | Admitting: *Deleted

## 2013-04-16 MED ORDER — DILTIAZEM HCL ER COATED BEADS 360 MG PO CP24: 360.0000 mg | ORAL_CAPSULE | Freq: Every day | ORAL | Status: DC

## 2013-04-25 ENCOUNTER — Encounter: Payer: Self-pay | Admitting: Internal Medicine

## 2013-04-25 ENCOUNTER — Ambulatory Visit (INDEPENDENT_AMBULATORY_CARE_PROVIDER_SITE_OTHER): Payer: BC Managed Care – PPO | Admitting: Internal Medicine

## 2013-04-25 VITALS — BP 125/77 | HR 61 | Ht 73.0 in | Wt 204.0 lb

## 2013-04-25 DIAGNOSIS — Z950 Presence of cardiac pacemaker: Secondary | ICD-10-CM

## 2013-04-25 DIAGNOSIS — I4891 Unspecified atrial fibrillation: Secondary | ICD-10-CM

## 2013-04-25 DIAGNOSIS — I495 Sick sinus syndrome: Secondary | ICD-10-CM

## 2013-04-25 LAB — PACEMAKER DEVICE OBSERVATION
BATTERY VOLTAGE: 2.9629 V
DEVICE MODEL PM: 7377947
RV LEAD AMPLITUDE: 12 mv
RV LEAD THRESHOLD: 0.5 V
VENTRICULAR PACING PM: 58

## 2013-04-25 NOTE — Patient Instructions (Addendum)
Continue all current medications. Allred - 1 year  

## 2013-04-25 NOTE — Progress Notes (Signed)
PCP: Toma Deiters, MD  Paul Dunn is a 62 y.o. male who presents today for routine electrophysiology followup.  Since last being seen in our clinic, the patient reports doing very well.   His exercise tolerance is stable.  Today, he denies symptoms of chest pain, shortness of breath,  lower extremity edema, dizziness, presyncope, or syncope.  The patient is otherwise without complaint today.   Past Medical History  Diagnosis Date  . Tachycardia-bradycardia syndrome     s/p PPM implant by Dr Johney Frame 04/12/12  . Atrial fibrillation     long standing persistent  . HTN (hypertension)   . Pulmonary nodule   . High cholesterol   . Peripheral vascular disease   . Asthma     "when I was a child"  . History of bronchitis     "used to get it twice/year; last time was ~ 2 yr ago" (04/12/2012)  . Type 2 diabetes mellitus   . History of blood transfusion ~ 1984  . H/O hiatal hernia   . GERD (gastroesophageal reflux disease)   . Kidney cysts     "never treated it"  . Gouty arthritis    Past Surgical History  Procedure Laterality Date  . Pacemaker insertion  04/12/2012    SJM Accent DR RF pacemaker implanted by Dr Johney Frame for tachy/brady syndrome  . Laceration repair  ~ 1984    left forearm; "cut it w/a power saw"  . Melanoma excision  ~ 2011    left posterior shoulder  . Esophageal dilation  01/2012    Current Outpatient Prescriptions  Medication Sig Dispense Refill  . allopurinol (ZYLOPRIM) 300 MG tablet Take 300 mg by mouth daily.      Marland Kitchen atorvastatin (LIPITOR) 10 MG tablet Take 10 mg by mouth daily.      . benazepril-hydrochlorthiazide (LOTENSIN HCT) 20-12.5 MG per tablet Take 1 tablet by mouth daily.      . Cinnamon 500 MG TABS Take 2 tablets by mouth daily.      Marland Kitchen diltiazem (CARDIZEM CD) 360 MG 24 hr capsule Take 1 capsule (360 mg total) by mouth daily.  30 capsule  0  . glipiZIDE (GLUCOTROL XL) 2.5 MG 24 hr tablet Take 2.5 mg by mouth daily.      . metFORMIN (GLUCOPHAGE) 500 MG  tablet Take 500 mg by mouth daily with breakfast.      . Omega-3 Fatty Acids (FISH OIL) 1000 MG CAPS Take 2 capsules by mouth daily.      . pantoprazole (PROTONIX) 20 MG tablet Take 20 mg by mouth daily.      Marland Kitchen warfarin (COUMADIN) 5 MG tablet Take 5 mg by mouth daily. As Directed.       No current facility-administered medications for this visit.    Physical Exam: Filed Vitals:   04/25/13 1444  BP: 125/77  Pulse: 61  Height: 6\' 1"  (1.854 m)  Weight: 204 lb (92.534 kg)    GEN- The patient is well appearing, alert and oriented x 3 today.   Head- normocephalic, atraumatic Eyes-  Sclera clear, conjunctiva pink Ears- hearing intact Oropharynx- clear Lungs- Clear to ausculation bilaterally, normal work of breathing Chest- pacemaker pocket is well healed Heart- Regular rate and rhythm (paced) GI- soft, NT, ND, + BS Extremities- no clubbing, cyanosis, or edema  Pacemaker interrogation- reviewed in detail today,  See PACEART report  Assessment and Plan:  1. Bradycardia Normal pacemaker function Programmed VVIR today to promote battery longevity See Arita Miss Art  report  2. afib He presently is not interested in initiation of tikosyn or any other AAD.  He wishes to continue rate control Continue coumadin for anticoagulation  Return in 1 year [employer]

## 2013-05-16 ENCOUNTER — Other Ambulatory Visit: Payer: Self-pay

## 2013-05-16 MED ORDER — DILTIAZEM HCL ER COATED BEADS 360 MG PO CP24: 360.0000 mg | ORAL_CAPSULE | Freq: Every day | ORAL | Status: DC

## 2013-06-11 ENCOUNTER — Other Ambulatory Visit: Payer: Self-pay

## 2013-06-11 MED ORDER — DILTIAZEM HCL ER COATED BEADS 360 MG PO CP24: 360.0000 mg | ORAL_CAPSULE | Freq: Every day | ORAL | Status: DC

## 2013-07-29 ENCOUNTER — Telehealth: Payer: Self-pay | Admitting: Internal Medicine

## 2013-07-29 ENCOUNTER — Ambulatory Visit (INDEPENDENT_AMBULATORY_CARE_PROVIDER_SITE_OTHER): Payer: BC Managed Care – PPO | Admitting: *Deleted

## 2013-07-29 DIAGNOSIS — I495 Sick sinus syndrome: Secondary | ICD-10-CM

## 2013-07-29 DIAGNOSIS — Z950 Presence of cardiac pacemaker: Secondary | ICD-10-CM

## 2013-07-29 LAB — MDC_IDC_ENUM_SESS_TYPE_REMOTE
Battery Voltage: 2.96 V
Date Time Interrogation Session: 20141117075029
Lead Channel Impedance Value: 700 Ohm
Lead Channel Pacing Threshold Amplitude: 0.5 V
Lead Channel Pacing Threshold Pulse Width: 0.4 ms
Lead Channel Sensing Intrinsic Amplitude: 12 mV
Lead Channel Setting Sensing Sensitivity: 2 mV

## 2013-07-29 NOTE — Telephone Encounter (Signed)
Patient does not know how to do the merlin check at home and had a call to do so

## 2013-07-30 NOTE — Telephone Encounter (Signed)
LMOVM for return call/kwm  

## 2013-07-30 NOTE — Telephone Encounter (Signed)
Transmission received. Spoke with pt in regards to this.

## 2013-08-05 ENCOUNTER — Encounter: Payer: Self-pay | Admitting: *Deleted

## 2013-08-06 ENCOUNTER — Encounter: Payer: Self-pay | Admitting: Internal Medicine

## 2013-10-30 ENCOUNTER — Encounter: Payer: Self-pay | Admitting: Internal Medicine

## 2013-10-30 ENCOUNTER — Ambulatory Visit (INDEPENDENT_AMBULATORY_CARE_PROVIDER_SITE_OTHER): Payer: BC Managed Care – PPO | Admitting: *Deleted

## 2013-10-30 DIAGNOSIS — I495 Sick sinus syndrome: Secondary | ICD-10-CM

## 2013-10-30 DIAGNOSIS — I4891 Unspecified atrial fibrillation: Secondary | ICD-10-CM

## 2013-10-30 LAB — MDC_IDC_ENUM_SESS_TYPE_REMOTE
Brady Statistic RV Percent Paced: 63 %
Implantable Pulse Generator Model: 2210
Implantable Pulse Generator Serial Number: 7377947
Lead Channel Setting Sensing Sensitivity: 2 mV
MDC IDC MSMT BATTERY REMAINING LONGEVITY: 103 mo
MDC IDC MSMT BATTERY VOLTAGE: 2.96 V
MDC IDC MSMT LEADCHNL RV IMPEDANCE VALUE: 710 Ohm
MDC IDC MSMT LEADCHNL RV SENSING INTR AMPL: 12 mV
MDC IDC SET LEADCHNL RV PACING AMPLITUDE: 2.5 V
MDC IDC SET LEADCHNL RV PACING PULSEWIDTH: 0.4 ms

## 2013-11-11 ENCOUNTER — Encounter: Payer: Self-pay | Admitting: *Deleted

## 2014-01-06 ENCOUNTER — Other Ambulatory Visit: Payer: Self-pay | Admitting: Internal Medicine

## 2014-02-04 ENCOUNTER — Ambulatory Visit (INDEPENDENT_AMBULATORY_CARE_PROVIDER_SITE_OTHER): Payer: BC Managed Care – PPO | Admitting: *Deleted

## 2014-02-04 ENCOUNTER — Encounter: Payer: Self-pay | Admitting: Internal Medicine

## 2014-02-04 DIAGNOSIS — I495 Sick sinus syndrome: Secondary | ICD-10-CM

## 2014-02-04 DIAGNOSIS — I4891 Unspecified atrial fibrillation: Secondary | ICD-10-CM

## 2014-02-04 NOTE — Progress Notes (Signed)
Remote pacemaker transmission.   

## 2014-02-12 LAB — MDC_IDC_ENUM_SESS_TYPE_REMOTE
Battery Remaining Longevity: 112 mo
Date Time Interrogation Session: 20150526121341
Lead Channel Impedance Value: 710 Ohm
Lead Channel Setting Pacing Amplitude: 2.5 V
MDC IDC MSMT BATTERY REMAINING PERCENTAGE: 82 %
MDC IDC MSMT BATTERY VOLTAGE: 2.96 V
MDC IDC MSMT LEADCHNL RV SENSING INTR AMPL: 11.8 mV
MDC IDC PG SERIAL: 7377947
MDC IDC SET LEADCHNL RV PACING PULSEWIDTH: 0.4 ms
MDC IDC SET LEADCHNL RV SENSING SENSITIVITY: 2 mV
MDC IDC STAT BRADY RV PERCENT PACED: 63 %

## 2014-02-21 ENCOUNTER — Encounter: Payer: Self-pay | Admitting: Cardiology

## 2014-05-30 ENCOUNTER — Encounter: Payer: Self-pay | Admitting: Internal Medicine

## 2014-05-30 ENCOUNTER — Ambulatory Visit (INDEPENDENT_AMBULATORY_CARE_PROVIDER_SITE_OTHER): Payer: BC Managed Care – PPO | Admitting: Internal Medicine

## 2014-05-30 VITALS — BP 139/83 | HR 64 | Ht 73.0 in | Wt 198.0 lb

## 2014-05-30 DIAGNOSIS — I119 Hypertensive heart disease without heart failure: Secondary | ICD-10-CM

## 2014-05-30 DIAGNOSIS — R0602 Shortness of breath: Secondary | ICD-10-CM

## 2014-05-30 DIAGNOSIS — I495 Sick sinus syndrome: Secondary | ICD-10-CM

## 2014-05-30 DIAGNOSIS — I4891 Unspecified atrial fibrillation: Secondary | ICD-10-CM

## 2014-05-30 DIAGNOSIS — Z950 Presence of cardiac pacemaker: Secondary | ICD-10-CM

## 2014-05-30 DIAGNOSIS — I480 Paroxysmal atrial fibrillation: Secondary | ICD-10-CM

## 2014-05-30 LAB — MDC_IDC_ENUM_SESS_TYPE_INCLINIC
Battery Remaining Longevity: 128.4 mo
Brady Statistic RV Percent Paced: 65 %
Date Time Interrogation Session: 20150918083743
Lead Channel Impedance Value: 725 Ohm
Lead Channel Pacing Threshold Amplitude: 0.5 V
Lead Channel Pacing Threshold Pulse Width: 0.4 ms
Lead Channel Sensing Intrinsic Amplitude: 12 mV
MDC IDC MSMT BATTERY VOLTAGE: 2.96 V
MDC IDC PG SERIAL: 7377947
MDC IDC SET LEADCHNL RV PACING AMPLITUDE: 2.5 V
MDC IDC SET LEADCHNL RV PACING PULSEWIDTH: 0.4 ms
MDC IDC SET LEADCHNL RV SENSING SENSITIVITY: 2 mV
MDC IDC STAT BRADY RA PERCENT PACED: 0 %

## 2014-05-30 NOTE — Progress Notes (Signed)
PCP: Neale Burly, MD  Paul Dunn is a 63 y.o. male who presents today for routine electrophysiology followup.  Since last being seen in our clinic, the patient reports doing very well.   He has some SOB with moderate activity but over all feels that his exercise tolerance is stable.  He is hunting without difficulty. Today, he denies symptoms of chest pain, shortness of breath,  lower extremity edema, dizziness, presyncope, or syncope.  The patient is otherwise without complaint today.   Past Medical History  Diagnosis Date  . Tachycardia-bradycardia syndrome     s/p PPM implant by Dr [name] 04/12/12  . Atrial fibrillation     long standing persistent  . HTN (hypertension)   . Pulmonary nodule   . High cholesterol   . Peripheral vascular disease   . Asthma     "when I was a child"  . History of bronchitis     "used to get it twice/year; last time was ~ 2 yr ago" (04/12/2012)  . Type 2 diabetes mellitus   . History of blood transfusion ~ 1984  . H/O hiatal hernia   . GERD (gastroesophageal reflux disease)   . Kidney cysts     "never treated it"  . Gouty arthritis    Past Surgical History  Procedure Laterality Date  . Pacemaker insertion  04/12/2012    SJM Accent DR RF pacemaker implanted by Dr [name] for tachy/brady syndrome  . Laceration repair  ~ 1984    left forearm; "cut it w/a power saw"  . Melanoma excision  ~ 2011    left posterior shoulder  . Esophageal dilation  01/2012    Current Outpatient Prescriptions  Medication Sig Dispense Refill  . allopurinol (ZYLOPRIM) 300 MG tablet Take 300 mg by mouth daily.      . benazepril-hydrochlorthiazide (LOTENSIN HCT) 20-12.5 MG per tablet Take 1 tablet by mouth daily.      . Cinnamon 500 MG TABS Take 2 tablets by mouth daily.      . colesevelam (WELCHOL) 625 MG tablet Take 1,875 mg by mouth 2 (two) times daily with a meal.      . glipiZIDE (GLUCOTROL XL) 2.5 MG 24 hr tablet Take 2.5 mg by mouth daily.      . metFORMIN  (GLUCOPHAGE) 500 MG tablet Take 500 mg by mouth daily with breakfast.      . Omega-3 Fatty Acids (FISH OIL) 1000 MG CAPS Take 1 capsule by mouth daily.       . pantoprazole (PROTONIX) 40 MG tablet Take 40 mg by mouth daily.      Marland Kitchen TAZTIA XT 360 MG 24 hr capsule TAKE ONE (1) CAPSULE EACH DAY  30 capsule  6  . warfarin (COUMADIN) 5 MG tablet Take 5 mg by mouth daily. As Directed.      . Zinc 50 MG TABS Take 1 tablet by mouth daily.       No current facility-administered medications for this visit.    Physical Exam: Filed Vitals:   05/30/14 0818  BP: 139/83  Pulse: 64  Height: 6\' 1"  (1.854 m)  Weight: 198 lb (89.812 kg)    GEN- The patient is well appearing, alert and oriented x 3 today.   Head- normocephalic, atraumatic Eyes-  Sclera clear, conjunctiva pink Ears- hearing intact Oropharynx- clear Lungs- Clear to ausculation bilaterally, normal work of breathing Chest- pacemaker pocket is well healed Heart- Regular rate and rhythm (paced) GI- soft, NT, ND, + BS Extremities-  no clubbing, cyanosis, or edema  Pacemaker interrogation- reviewed in detail today,  See PACEART report  Assessment and Plan:  1. Symptomatic Bradycardia Normal pacemaker function See Pace Art report  2. Longstanding persistent afib He presently is not interested in initiation of tikosyn or any other AAD.  He wishes to continue rate control Continue coumadin for anticoagulation Obtain an echo to evaluate for structural changes related to his afib  3. Hypertensive cardiovascular disease Stable No change required today   Return in 1 year [employer]

## 2014-05-30 NOTE — Patient Instructions (Signed)
Your physician recommends that you schedule a follow-up appointment in: 1 year with Dr. [name]. You will receive a reminder letter in the mail in about 10 months reminding you to call and schedule your appointment. If you don't receive this letter, please contact our office. Merlin/device check 09/01/14. Your physician recommends that you continue on your current medications as directed. Please refer to the Current Medication list given to you today. Your physician has requested that you have an echocardiogram. Echocardiography is a painless test that uses sound waves to create images of your heart. It provides your doctor with information about the size and shape of your heart and how well your heart's chambers and valves are working. This procedure takes approximately one hour. There are no restrictions for this procedure.

## 2014-06-12 ENCOUNTER — Other Ambulatory Visit: Payer: BC Managed Care – PPO

## 2014-06-18 ENCOUNTER — Other Ambulatory Visit: Payer: BC Managed Care – PPO

## 2014-06-26 ENCOUNTER — Other Ambulatory Visit (INDEPENDENT_AMBULATORY_CARE_PROVIDER_SITE_OTHER): Payer: BC Managed Care – PPO

## 2014-06-26 ENCOUNTER — Other Ambulatory Visit: Payer: Self-pay

## 2014-06-26 DIAGNOSIS — R0602 Shortness of breath: Secondary | ICD-10-CM

## 2014-06-26 DIAGNOSIS — I480 Paroxysmal atrial fibrillation: Secondary | ICD-10-CM

## 2014-06-26 DIAGNOSIS — I495 Sick sinus syndrome: Secondary | ICD-10-CM

## 2014-07-09 ENCOUNTER — Telehealth: Payer: Self-pay | Admitting: *Deleted

## 2014-07-09 NOTE — Telephone Encounter (Signed)
Patient informed. 

## 2014-08-21 ENCOUNTER — Encounter (HOSPITAL_COMMUNITY): Payer: Self-pay | Admitting: Internal Medicine

## 2014-09-01 ENCOUNTER — Encounter: Payer: Self-pay | Admitting: Internal Medicine

## 2014-09-01 ENCOUNTER — Ambulatory Visit (INDEPENDENT_AMBULATORY_CARE_PROVIDER_SITE_OTHER): Payer: BC Managed Care – PPO | Admitting: *Deleted

## 2014-09-01 DIAGNOSIS — I495 Sick sinus syndrome: Secondary | ICD-10-CM

## 2014-09-01 LAB — MDC_IDC_ENUM_SESS_TYPE_REMOTE
Battery Remaining Percentage: 82 %
Battery Voltage: 2.96 V
Date Time Interrogation Session: 20151221081740
Implantable Pulse Generator Model: 2210
Lead Channel Impedance Value: 730 Ohm
Lead Channel Pacing Threshold Amplitude: 0.5 V
Lead Channel Pacing Threshold Pulse Width: 0.4 ms
Lead Channel Sensing Intrinsic Amplitude: 12 mV
Lead Channel Setting Sensing Sensitivity: 2 mV
MDC IDC MSMT BATTERY REMAINING LONGEVITY: 112 mo
MDC IDC PG SERIAL: 7377947
MDC IDC SET LEADCHNL RV PACING AMPLITUDE: 2.5 V
MDC IDC SET LEADCHNL RV PACING PULSEWIDTH: 0.4 ms
MDC IDC STAT BRADY RV PERCENT PACED: 72 %

## 2014-09-01 NOTE — Progress Notes (Signed)
Remote pacemaker transmission.   

## 2014-09-09 ENCOUNTER — Encounter: Payer: Self-pay | Admitting: Cardiology

## 2014-10-06 ENCOUNTER — Other Ambulatory Visit: Payer: Self-pay | Admitting: Internal Medicine

## 2014-10-08 ENCOUNTER — Telehealth: Payer: Self-pay | Admitting: *Deleted

## 2014-10-08 MED ORDER — DILTIAZEM HCL ER BEADS 180 MG PO CP24: 360.0000 mg | ORAL_CAPSULE | Freq: Every day | ORAL | Status: DC

## 2014-10-08 NOTE — Telephone Encounter (Signed)
Patient is requesting 180 mg capsules instead of 360 mg capsules because its cheaper by $20. New prescription sent to pharmacy for diltiazem 180 mg taking 2 daily.

## 2014-10-14 ENCOUNTER — Telehealth: Payer: Self-pay | Admitting: *Deleted

## 2014-10-14 NOTE — Telephone Encounter (Signed)
Patient's insurance has cardizem cd listed as a tier 3 drug and is wanting to switch patient to dilacor instead. Please advise if this is okay and if so, please specify dose and directions for dilacor.

## 2014-10-24 MED ORDER — DILTIAZEM HCL ER 180 MG PO CP24: 360.0000 mg | ORAL_CAPSULE | Freq: Every day | ORAL | Status: DC

## 2014-10-24 NOTE — Telephone Encounter (Signed)
Spoke with Dr. Harl Bowie and he confirmed that it would be okay to change from cardizem cd 360 mg to dilacor 360 mg. Patient informed via message machine that dilacor sent to pharmacy.

## 2014-11-27 ENCOUNTER — Telehealth: Payer: Self-pay | Admitting: Internal Medicine

## 2014-11-27 NOTE — Telephone Encounter (Signed)
Coming by Friday March 18 or Monday March 21st to bring new insurance

## 2014-12-01 ENCOUNTER — Telehealth: Payer: Self-pay | Admitting: Internal Medicine

## 2014-12-01 ENCOUNTER — Telehealth: Payer: Self-pay | Admitting: Cardiology

## 2014-12-01 ENCOUNTER — Ambulatory Visit (INDEPENDENT_AMBULATORY_CARE_PROVIDER_SITE_OTHER): Payer: 59 | Admitting: *Deleted

## 2014-12-01 DIAGNOSIS — I495 Sick sinus syndrome: Secondary | ICD-10-CM

## 2014-12-01 LAB — MDC_IDC_ENUM_SESS_TYPE_REMOTE
Battery Remaining Longevity: 113 mo
Battery Remaining Percentage: 82 %
Brady Statistic RV Percent Paced: 69 %
Date Time Interrogation Session: 20160321064744
Lead Channel Impedance Value: 800 Ohm
Lead Channel Pacing Threshold Pulse Width: 0.4 ms
MDC IDC MSMT BATTERY VOLTAGE: 2.96 V
MDC IDC MSMT LEADCHNL RV PACING THRESHOLD AMPLITUDE: 0.5 V
MDC IDC MSMT LEADCHNL RV SENSING INTR AMPL: 12 mV
MDC IDC PG SERIAL: 7377947
MDC IDC SET LEADCHNL RV PACING AMPLITUDE: 2.5 V
MDC IDC SET LEADCHNL RV PACING PULSEWIDTH: 0.4 ms
MDC IDC SET LEADCHNL RV SENSING SENSITIVITY: 2 mV

## 2014-12-01 NOTE — Telephone Encounter (Signed)
Attempted to call and confirm remote transmission. 1st number no answer and unable to leave a message. 2nd number wrong number.

## 2014-12-01 NOTE — Progress Notes (Signed)
Remote pacemaker transmission.   

## 2014-12-01 NOTE — Telephone Encounter (Signed)
transmission received

## 2014-12-01 NOTE — Telephone Encounter (Signed)
Follow Up        Pt returning Barbara's phone call. Please call back and advise.

## 2014-12-09 ENCOUNTER — Ambulatory Visit: Payer: 59 | Admitting: Cardiovascular Disease

## 2014-12-18 ENCOUNTER — Encounter: Payer: Self-pay | Admitting: Cardiology

## 2014-12-22 ENCOUNTER — Encounter: Payer: Self-pay | Admitting: Internal Medicine

## 2015-03-04 ENCOUNTER — Telehealth: Payer: Self-pay | Admitting: Cardiology

## 2015-03-04 ENCOUNTER — Ambulatory Visit (INDEPENDENT_AMBULATORY_CARE_PROVIDER_SITE_OTHER): Payer: 59 | Admitting: *Deleted

## 2015-03-04 ENCOUNTER — Telehealth: Payer: Self-pay | Admitting: Internal Medicine

## 2015-03-04 DIAGNOSIS — I495 Sick sinus syndrome: Secondary | ICD-10-CM

## 2015-03-04 NOTE — Telephone Encounter (Signed)
°

## 2015-03-04 NOTE — Progress Notes (Signed)
Remote pacemaker transmission.   

## 2015-03-04 NOTE — Telephone Encounter (Signed)
Informed pt that transmission was received.  

## 2015-03-04 NOTE — Telephone Encounter (Signed)
LMOVM reminding pt to send remote transmission.   

## 2015-03-08 LAB — CUP PACEART REMOTE DEVICE CHECK
Battery Remaining Longevity: 133 mo
Battery Remaining Percentage: 95.5 %
Battery Voltage: 2.96 V
Date Time Interrogation Session: 20160622071825
Lead Channel Impedance Value: 690 Ohm
Lead Channel Pacing Threshold Amplitude: 0.5 V
Lead Channel Setting Pacing Amplitude: 2.5 V
MDC IDC MSMT LEADCHNL RV PACING THRESHOLD PULSEWIDTH: 0.4 ms
MDC IDC MSMT LEADCHNL RV SENSING INTR AMPL: 12 mV
MDC IDC PG SERIAL: 7377947
MDC IDC SET LEADCHNL RV PACING PULSEWIDTH: 0.4 ms
MDC IDC SET LEADCHNL RV SENSING SENSITIVITY: 2 mV
MDC IDC STAT BRADY RV PERCENT PACED: 70 %

## 2015-03-11 ENCOUNTER — Encounter: Payer: Self-pay | Admitting: Cardiology

## 2015-03-23 ENCOUNTER — Encounter: Payer: Self-pay | Admitting: Internal Medicine

## 2015-05-15 ENCOUNTER — Encounter: Payer: Self-pay | Admitting: Internal Medicine

## 2015-05-15 ENCOUNTER — Ambulatory Visit (INDEPENDENT_AMBULATORY_CARE_PROVIDER_SITE_OTHER): Payer: 59 | Admitting: Internal Medicine

## 2015-05-15 VITALS — BP 138/80 | HR 71 | Ht 73.0 in | Wt 203.8 lb

## 2015-05-15 DIAGNOSIS — I495 Sick sinus syndrome: Secondary | ICD-10-CM | POA: Diagnosis not present

## 2015-05-15 DIAGNOSIS — I119 Hypertensive heart disease without heart failure: Secondary | ICD-10-CM

## 2015-05-15 DIAGNOSIS — I482 Chronic atrial fibrillation, unspecified: Secondary | ICD-10-CM

## 2015-05-15 DIAGNOSIS — Z950 Presence of cardiac pacemaker: Secondary | ICD-10-CM

## 2015-05-15 NOTE — Progress Notes (Signed)
PCP: Neale Burly, MD  Paul Dunn is a 64 y.o. male who presents today for routine electrophysiology followup.  Since last being seen in our clinic, the patient reports doing very well.   He has some SOB with moderate activity but over all feels that his exercise tolerance is stable.  He is still hunting without difficulty. Today, he denies symptoms of chest pain, shortness of breath,  lower extremity edema, dizziness, presyncope, or syncope.  The patient is otherwise without complaint today.   Past Medical History  Diagnosis Date  . Tachycardia-bradycardia syndrome     s/p PPM implant by Dr [name] 04/12/12  . Atrial fibrillation     long standing persistent  . HTN (hypertension)   . Pulmonary nodule   . High cholesterol   . Peripheral vascular disease   . Asthma     "when I was a child"  . History of bronchitis     "used to get it twice/year; last time was ~ 2 yr ago" (04/12/2012)  . Type 2 diabetes mellitus   . History of blood transfusion ~ 1984  . H/O hiatal hernia   . GERD (gastroesophageal reflux disease)   . Kidney cysts     "never treated it"  . Gouty arthritis    Past Surgical History  Procedure Laterality Date  . Pacemaker insertion  04/12/2012    SJM Accent DR RF pacemaker implanted by Dr [name] for tachy/brady syndrome  . Laceration repair  ~ 1984    left forearm; "cut it w/a power saw"  . Melanoma excision  ~ 2011    left posterior shoulder  . Esophageal dilation  01/2012  . Permanent pacemaker insertion N/A 04/12/2012    Procedure: PERMANENT PACEMAKER INSERTION;  Surgeon: Thompson Grayer, MD;  Location: [facility] CATH LAB;  Service: Cardiovascular;  Laterality: N/A;    Current Outpatient Prescriptions  Medication Sig Dispense Refill  . allopurinol (ZYLOPRIM) 300 MG tablet Take 300 mg by mouth daily.    . benazepril-hydrochlorthiazide (LOTENSIN HCT) 20-12.5 MG per tablet Take 1 tablet by mouth daily.    . Cinnamon 500 MG TABS Take 2 tablets by mouth daily.    .  colesevelam (WELCHOL) 625 MG tablet Take 1,875 mg by mouth 2 (two) times daily with a meal.    . diltiazem (DILACOR XR) 180 MG 24 hr capsule Take 2 capsules (360 mg total) by mouth daily. 180 capsule 3  . glipiZIDE (GLUCOTROL XL) 2.5 MG 24 hr tablet Take 2.5 mg by mouth daily.    . metFORMIN (GLUCOPHAGE) 500 MG tablet Take 500 mg by mouth daily with breakfast.    . Omega-3 Fatty Acids (FISH OIL) 1000 MG CAPS Take 1 capsule by mouth daily. 1200 mg    . pantoprazole (PROTONIX) 40 MG tablet Take 40 mg by mouth daily.    Marland Kitchen warfarin (COUMADIN) 5 MG tablet Take 5 mg by mouth daily. As Directed.    . Zinc 50 MG TABS Take 1 tablet by mouth daily.     No current facility-administered medications for this visit.    Physical Exam: Filed Vitals:   05/15/15 1144  BP: 138/80  Pulse: 71  Height: 6\' 1"  (1.854 m)  Weight: 92.443 kg (203 lb 12.8 oz)  SpO2: 98%    GEN- The patient is well appearing, alert and oriented x 3 today.   Head- normocephalic, atraumatic Eyes-  Sclera clear, conjunctiva pink Ears- hearing intact Oropharynx- clear Lungs- Clear to ausculation bilaterally, normal work of  breathing Chest- pacemaker pocket is well healed Heart- Regular rate and rhythm (paced) GI- soft, NT, ND, + BS Extremities- no clubbing, cyanosis, or edema  Pacemaker interrogation- reviewed in detail today,  See PACEART report  Assessment and Plan:  1. Symptomatic Bradycardia Normal pacemaker function See Pace Art report  2. Longstanding persistent afib He presently is not interested in initiation of tikosyn or any other AAD.  He wishes to continue rate control Continue coumadin for anticoagulation  Consider repeat echo upon return  3. Hypertensive cardiovascular disease Stable No change required today   Return in 1 year [employer]

## 2015-05-15 NOTE — Patient Instructions (Signed)
Your physician recommends that you continue on your current medications as directed. Please refer to the Current Medication list given to you today. Device check 08/17/15. Your physician recommends that you schedule a follow-up appointment in:1 year with Dr. Allred. You will receive a reminder letter in the mail in about 10 months reminding you to call and schedule your appointment. If you don't receive this letter, please contact our office. 

## 2015-05-19 LAB — CUP PACEART INCLINIC DEVICE CHECK
Battery Remaining Longevity: 127.2 mo
Brady Statistic RA Percent Paced: 0 %
Brady Statistic RV Percent Paced: 71 %
Date Time Interrogation Session: 20160902160227
Lead Channel Impedance Value: 687.5 Ohm
Lead Channel Pacing Threshold Amplitude: 0.5 V
Lead Channel Pacing Threshold Amplitude: 0.5 V
Lead Channel Pacing Threshold Pulse Width: 0.4 ms
Lead Channel Sensing Intrinsic Amplitude: 12 mV
Lead Channel Sensing Intrinsic Amplitude: 3.6 mV
Lead Channel Setting Pacing Pulse Width: 0.4 ms
Lead Channel Setting Sensing Sensitivity: 2 mV
MDC IDC MSMT BATTERY VOLTAGE: 2.96 V
MDC IDC MSMT LEADCHNL RV PACING THRESHOLD PULSEWIDTH: 0.4 ms
MDC IDC SET LEADCHNL RV PACING AMPLITUDE: 2.5 V
Pulse Gen Serial Number: 7377947

## 2015-06-25 ENCOUNTER — Ambulatory Visit (INDEPENDENT_AMBULATORY_CARE_PROVIDER_SITE_OTHER): Payer: 59 | Admitting: *Deleted

## 2015-06-25 DIAGNOSIS — Z23 Encounter for immunization: Secondary | ICD-10-CM

## 2015-08-17 ENCOUNTER — Ambulatory Visit (INDEPENDENT_AMBULATORY_CARE_PROVIDER_SITE_OTHER): Payer: 59 | Admitting: *Deleted

## 2015-08-17 DIAGNOSIS — I495 Sick sinus syndrome: Secondary | ICD-10-CM | POA: Diagnosis not present

## 2015-08-17 NOTE — Progress Notes (Signed)
Remote pacemaker transmission.   

## 2015-08-26 LAB — CUP PACEART REMOTE DEVICE CHECK
Battery Remaining Longevity: 121 mo
Battery Remaining Percentage: 91 %
Battery Voltage: 2.95 V
Date Time Interrogation Session: 20161205081722
Implantable Lead Implant Date: 20130801
Implantable Lead Implant Date: 20130801
Implantable Lead Location: 753859
Implantable Lead Location: 753860
Implantable Lead Model: 1948
Lead Channel Impedance Value: 640 Ohm
Lead Channel Pacing Threshold Amplitude: 0.5 V
Lead Channel Pacing Threshold Pulse Width: 0.4 ms
Lead Channel Setting Pacing Pulse Width: 0.4 ms
MDC IDC MSMT LEADCHNL RV SENSING INTR AMPL: 12 mV
MDC IDC SET LEADCHNL RV PACING AMPLITUDE: 2.5 V
MDC IDC SET LEADCHNL RV SENSING SENSITIVITY: 2 mV
MDC IDC STAT BRADY RV PERCENT PACED: 78 %
Pulse Gen Model: 2210
Pulse Gen Serial Number: 7377947

## 2015-08-28 ENCOUNTER — Encounter: Payer: Self-pay | Admitting: Cardiology

## 2015-09-28 ENCOUNTER — Other Ambulatory Visit: Payer: Self-pay | Admitting: Cardiology

## 2015-10-26 ENCOUNTER — Other Ambulatory Visit: Payer: Self-pay | Admitting: Cardiology

## 2015-11-16 ENCOUNTER — Ambulatory Visit (INDEPENDENT_AMBULATORY_CARE_PROVIDER_SITE_OTHER): Payer: BLUE CROSS/BLUE SHIELD | Admitting: *Deleted

## 2015-11-16 DIAGNOSIS — I495 Sick sinus syndrome: Secondary | ICD-10-CM

## 2015-11-19 NOTE — Progress Notes (Signed)
Remote pacemaker transmission.   

## 2015-11-21 LAB — CUP PACEART REMOTE DEVICE CHECK
Battery Remaining Longevity: 121 mo
Battery Remaining Percentage: 91 %
Battery Voltage: 2.95 V
Date Time Interrogation Session: 20170306085409
Implantable Lead Implant Date: 20130801
Implantable Lead Location: 753859
Implantable Lead Location: 753860
Implantable Lead Model: 1948
Lead Channel Pacing Threshold Amplitude: 0.5 V
Lead Channel Setting Sensing Sensitivity: 2 mV
MDC IDC LEAD IMPLANT DT: 20130801
MDC IDC MSMT LEADCHNL RV IMPEDANCE VALUE: 640 Ohm
MDC IDC MSMT LEADCHNL RV PACING THRESHOLD PULSEWIDTH: 0.4 ms
MDC IDC MSMT LEADCHNL RV SENSING INTR AMPL: 12 mV
MDC IDC SET LEADCHNL RV PACING AMPLITUDE: 2.5 V
MDC IDC SET LEADCHNL RV PACING PULSEWIDTH: 0.4 ms
MDC IDC STAT BRADY RV PERCENT PACED: 80 %
Pulse Gen Model: 2210
Pulse Gen Serial Number: 7377947

## 2015-11-21 NOTE — Progress Notes (Signed)
Normal remote reviewed.  Next Merlin 02/15/16 

## 2015-11-25 ENCOUNTER — Encounter: Payer: Self-pay | Admitting: Cardiology

## 2016-02-15 ENCOUNTER — Ambulatory Visit (INDEPENDENT_AMBULATORY_CARE_PROVIDER_SITE_OTHER): Payer: BLUE CROSS/BLUE SHIELD | Admitting: *Deleted

## 2016-02-15 DIAGNOSIS — I495 Sick sinus syndrome: Secondary | ICD-10-CM | POA: Diagnosis not present

## 2016-02-15 NOTE — Progress Notes (Signed)
Remote pacemaker transmission.   

## 2016-02-26 LAB — CUP PACEART REMOTE DEVICE CHECK
Battery Remaining Percentage: 91 %
Battery Voltage: 2.95 V
Brady Statistic RV Percent Paced: 81 %
Implantable Lead Implant Date: 20130801
Implantable Lead Implant Date: 20130801
Implantable Lead Location: 753860
Lead Channel Impedance Value: 690 Ohm
Lead Channel Pacing Threshold Pulse Width: 0.4 ms
Lead Channel Setting Pacing Amplitude: 2.5 V
Lead Channel Setting Pacing Pulse Width: 0.4 ms
MDC IDC LEAD LOCATION: 753859
MDC IDC LEAD MODEL: 1948
MDC IDC MSMT BATTERY REMAINING LONGEVITY: 122 mo
MDC IDC MSMT LEADCHNL RV PACING THRESHOLD AMPLITUDE: 0.5 V
MDC IDC MSMT LEADCHNL RV SENSING INTR AMPL: 12 mV
MDC IDC SESS DTM: 20170605072659
MDC IDC SET LEADCHNL RV SENSING SENSITIVITY: 2 mV
Pulse Gen Model: 2210
Pulse Gen Serial Number: 7377947

## 2016-03-02 ENCOUNTER — Encounter: Payer: Self-pay | Admitting: Cardiology

## 2016-05-11 ENCOUNTER — Encounter: Payer: Self-pay | Admitting: Internal Medicine

## 2016-05-11 ENCOUNTER — Ambulatory Visit (INDEPENDENT_AMBULATORY_CARE_PROVIDER_SITE_OTHER): Payer: BLUE CROSS/BLUE SHIELD | Admitting: Internal Medicine

## 2016-05-11 ENCOUNTER — Encounter: Payer: Self-pay | Admitting: *Deleted

## 2016-05-11 VITALS — BP 118/84 | HR 80 | Ht 73.0 in | Wt 203.0 lb

## 2016-05-11 DIAGNOSIS — R0602 Shortness of breath: Secondary | ICD-10-CM

## 2016-05-11 DIAGNOSIS — R0789 Other chest pain: Secondary | ICD-10-CM

## 2016-05-11 DIAGNOSIS — I119 Hypertensive heart disease without heart failure: Secondary | ICD-10-CM

## 2016-05-11 DIAGNOSIS — Z950 Presence of cardiac pacemaker: Secondary | ICD-10-CM | POA: Diagnosis not present

## 2016-05-11 DIAGNOSIS — I482 Chronic atrial fibrillation, unspecified: Secondary | ICD-10-CM

## 2016-05-11 DIAGNOSIS — I495 Sick sinus syndrome: Secondary | ICD-10-CM

## 2016-05-11 DIAGNOSIS — R079 Chest pain, unspecified: Secondary | ICD-10-CM

## 2016-05-11 LAB — CUP PACEART INCLINIC DEVICE CHECK
Battery Voltage: 2.95 V
Brady Statistic RA Percent Paced: 0 %
Implantable Lead Implant Date: 20130801
Implantable Lead Location: 753860
Implantable Lead Model: 1948
Lead Channel Pacing Threshold Pulse Width: 0.4 ms
Lead Channel Sensing Intrinsic Amplitude: 12 mV
Lead Channel Setting Pacing Amplitude: 2.5 V
Lead Channel Setting Pacing Pulse Width: 0.4 ms
MDC IDC LEAD IMPLANT DT: 20130801
MDC IDC LEAD LOCATION: 753859
MDC IDC MSMT BATTERY REMAINING LONGEVITY: 116.4
MDC IDC MSMT LEADCHNL RV IMPEDANCE VALUE: 662.5 Ohm
MDC IDC MSMT LEADCHNL RV PACING THRESHOLD AMPLITUDE: 0.5 V
MDC IDC MSMT LEADCHNL RV PACING THRESHOLD AMPLITUDE: 0.5 V
MDC IDC MSMT LEADCHNL RV PACING THRESHOLD PULSEWIDTH: 0.4 ms
MDC IDC PG SERIAL: 7377947
MDC IDC SESS DTM: 20170830084740
MDC IDC SET LEADCHNL RV SENSING SENSITIVITY: 2 mV
MDC IDC STAT BRADY RV PERCENT PACED: 84 %

## 2016-05-11 NOTE — Patient Instructions (Signed)
Medication Instructions:   Continue all current medications.  Labwork:  none  Testing/Procedures:  Your physician has requested that you have an echocardiogram. Echocardiography is a painless test that uses sound waves to create images of your heart. It provides your doctor with information about the size and shape of your heart and how well your heart's chambers and valves are working. This procedure takes approximately one hour. There are no restrictions for this procedure.  Your physician has requested that you have a lexiscan myoview. For further information please visit [email or web address]. Please follow instruction sheet, as given.  Office will contact with results via phone or letter.    Follow-Up:  Your physician wants you to follow up in:  1 year.  You will receive a reminder letter in the mail one-two months in advance.  If you don't receive a letter, please call our office to schedule the follow up appointment   Any Other Special Instructions Will Be Listed Below (If Applicable).  Remote monitoring is used to monitor your Pacemaker of ICD from home. This monitoring reduces the number of office visits required to check your device to one time per year. It allows [place] to keep an eye on the functioning of your device to ensure it is working properly. You are scheduled for a device check from home on 08/10/2016. You may send your transmission at any time that day. If you have a wireless device, the transmission will be sent automatically. After your physician reviews your transmission, you will receive a postcard with your next transmission date.    If you need a refill on your cardiac medications before your next appointment, please call your pharmacy.

## 2016-05-11 NOTE — Progress Notes (Signed)
PCP: Neale Burly, MD  Paul Dunn is a 65 y.o. male who presents today for routine electrophysiology followup.  Since last being seen in our clinic, the patient reports doing very well.   He has some SOB with moderate activity but over all feels that his exercise tolerance is stable.  He is hunting less.  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  He describes this as cramping across his chest.  He also has occasional edema, worse with increased dietary sodium.  Today, he denies symptoms of  dizziness, presyncope, or syncope.  The patient is otherwise without complaint today.   Past Medical History:  Diagnosis Date  . Asthma    "when I was a child"  . Atrial fibrillation ([place])    long standing persistent  . GERD (gastroesophageal reflux disease)   . Gouty arthritis   . H/O hiatal hernia   . High cholesterol   . History of blood transfusion ~ 1984  . History of bronchitis    "used to get it twice/year; last time was ~ 2 yr ago" (04/12/2012)  . HTN (hypertension)   . Kidney cysts    "never treated it"  . Peripheral vascular disease ([place])   . Pulmonary nodule   . 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 Type 2 diabetes mellitus ([place])    Past Surgical History:  Procedure Laterality Date  . ESOPHAGEAL DILATION  01/2012  . LACERATION REPAIR  ~ 1984   left forearm; "cut it w/a power saw"  . MELANOMA EXCISION  ~ 2011   left posterior shoulder  . PACEMAKER INSERTION  04/12/2012   SJM Accent DR RF pacemaker implanted by Dr [name] for tachy/brady syndrome  . PERMANENT PACEMAKER INSERTION N/A 04/12/2012   Procedure: PERMANENT PACEMAKER INSERTION;  Surgeon: Thompson Grayer, MD;  Location: [facility] CATH LAB;  Service: Cardiovascular;  Laterality: N/A;    Current Outpatient Prescriptions  Medication Sig Dispense Refill  . allopurinol (ZYLOPRIM) 300 MG tablet Take 300 mg by mouth daily.    . Cinnamon 500  MG TABS Take 2 tablets by mouth daily.    . colesevelam (WELCHOL) 625 MG tablet Take 1,875 mg by mouth 2 (two) times daily with a meal.    . diltiazem (DILACOR XR) 180 MG 24 hr capsule TAKE TWO CAPSULES EACH DAY 180 capsule 1  . glipiZIDE (GLUCOTROL XL) 2.5 MG 24 hr tablet Take 2.5 mg by mouth daily.    Marland Kitchen lisinopril-hydrochlorothiazide (PRINZIDE,ZESTORETIC) 20-12.5 MG tablet Take 1 tablet by mouth daily.    . metFORMIN (GLUCOPHAGE) 500 MG tablet Take 500 mg by mouth daily with breakfast.    . Omega-3 Fatty Acids (FISH OIL) 1000 MG CAPS Take 1 capsule by mouth daily. 1200 mg    . pantoprazole (PROTONIX) 40 MG tablet Take 40 mg by mouth daily.    Marland Kitchen warfarin (COUMADIN) 5 MG tablet Take 5 mg by mouth daily. As Directed.    . Zinc 50 MG TABS Take 1 tablet by mouth daily.     No current facility-administered medications for this visit.     Physical Exam: Vitals:   05/11/16 0814  BP: 118/84  Pulse: 80  SpO2: 96%  Weight: 203 lb (92.1 kg)  Height: 6\' 1"  (1.854 m)    GEN- The patient is well appearing, alert and oriented x 3 today.   Head- normocephalic, atraumatic Eyes-  Sclera  clear, conjunctiva pink Ears- hearing intact Oropharynx- clear Lungs- Clear to ausculation bilaterally, normal work of breathing Chest- pacemaker pocket is well healed Heart- Regular rate and rhythm (paced) GI- soft, NT, ND, + BS Extremities- no clubbing, cyanosis, or edema  Pacemaker interrogation- reviewed in detail today,  See PACEART report  Assessment and Plan:  1. Symptomatic Bradycardia Normal pacemaker function See Pace Art report  2. Longstanding persistent afib He presently is not interested in initiation of tikosyn or any other AAD.  He wishes to continue rate control Continue coumadin for anticoagulation  Echo to evaluate for structural changes  3. Hypertensive cardiovascular disease Stable No change required today 2 gram sodium restriction  4. Chest pain Both typical and atypical  features lexiscan myoview is ordered  Return in 1 year unless further problems arise Merlin  At the end of our visit today, Rev Champeau prayed with/ for me.  This is appreciated.  Thompson Grayer MD, [facility] 05/11/2016 9:22 AM

## 2016-05-12 ENCOUNTER — Telehealth: Payer: Self-pay | Admitting: Internal Medicine

## 2016-05-12 NOTE — Telephone Encounter (Signed)
Pt wanted to update medication list - no longer taking Welchol - says Dr. Sherrie Sport started Cialis 5 mg daily for prostate and thinks it making eyes/feet "puffy". Suggested pt call Dr. Sherrie Sport since he started Cialis for further advise. Pt voiced understanding

## 2016-05-12 NOTE — Telephone Encounter (Signed)
Paul Dunn called stating that he needs to go over his medication list.

## 2016-05-13 ENCOUNTER — Encounter: Payer: Self-pay | Admitting: Internal Medicine

## 2016-06-01 ENCOUNTER — Other Ambulatory Visit: Payer: Self-pay

## 2016-06-01 ENCOUNTER — Ambulatory Visit (INDEPENDENT_AMBULATORY_CARE_PROVIDER_SITE_OTHER): Payer: BLUE CROSS/BLUE SHIELD

## 2016-06-01 ENCOUNTER — Telehealth: Payer: Self-pay | Admitting: Internal Medicine

## 2016-06-01 DIAGNOSIS — R079 Chest pain, unspecified: Secondary | ICD-10-CM | POA: Diagnosis not present

## 2016-06-01 DIAGNOSIS — I482 Chronic atrial fibrillation, unspecified: Secondary | ICD-10-CM

## 2016-06-01 NOTE — Telephone Encounter (Signed)
Pt says he stopped taking Tadalafil 5 mg and swelling stopped - echo completed today - Denies chest pain and wanted to verify with Dr. [name] if he still needed to have stress test done - scheduled for 06/03/16 - will forward to Dr. [name]

## 2016-06-01 NOTE — Telephone Encounter (Signed)
Paul Dunn came into office today for his echo.  States that he is no longer having swelling.  Wants to know if Dr.Allred wants him to continue with having stress test.

## 2016-06-02 NOTE — Telephone Encounter (Signed)
PT aware and will proceed with stress test tomorrow as planned. Appreciative of call

## 2016-06-02 NOTE — Telephone Encounter (Signed)
Yes.   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

## 2016-06-03 ENCOUNTER — Inpatient Hospital Stay (HOSPITAL_COMMUNITY): Admission: RE | Admit: 2016-06-03 | Payer: BLUE CROSS/BLUE SHIELD | Source: Ambulatory Visit

## 2016-06-03 ENCOUNTER — Encounter (HOSPITAL_COMMUNITY): Payer: BLUE CROSS/BLUE SHIELD

## 2016-06-07 ENCOUNTER — Telehealth: Payer: Self-pay | Admitting: *Deleted

## 2016-06-07 NOTE — Telephone Encounter (Signed)
Notes Recorded by Laurine Blazer, LPN on [id number] at [id number] PM EDT Patient notified. Copy to pmd.

## 2016-06-07 NOTE — Telephone Encounter (Signed)
Notes Recorded by Laurine Blazer, LPN on [id number] at 075-GRM AM EDT Left message to return call.  ------  Notes Recorded by Thompson Grayer, MD on 06/02/2016 at 11:40 AM EDT Results reviewed. Claiborne Ide, please inform pt of result. I will route to primary care also.

## 2016-06-08 ENCOUNTER — Inpatient Hospital Stay (HOSPITAL_COMMUNITY): Admission: RE | Admit: 2016-06-08 | Payer: BLUE CROSS/BLUE SHIELD | Source: Ambulatory Visit

## 2016-06-08 ENCOUNTER — Encounter (HOSPITAL_COMMUNITY): Payer: Self-pay

## 2016-06-08 ENCOUNTER — Encounter (HOSPITAL_COMMUNITY)
Admission: RE | Admit: 2016-06-08 | Discharge: 2016-06-08 | Disposition: A | Discharge status: Home / Self Care | Payer: BLUE CROSS/BLUE SHIELD | Source: Ambulatory Visit | Attending: Internal Medicine | Admitting: Internal Medicine

## 2016-06-08 DIAGNOSIS — I510 Cardiac septal defect, acquired: Secondary | ICD-10-CM | POA: Insufficient documentation

## 2016-06-08 DIAGNOSIS — R079 Chest pain, unspecified: Secondary | ICD-10-CM | POA: Diagnosis not present

## 2016-06-08 DIAGNOSIS — I482 Chronic atrial fibrillation, unspecified: Secondary | ICD-10-CM

## 2016-06-08 LAB — NM MYOCAR MULTI W/SPECT W/WALL MOTION / EF
CHL CUP NUCLEAR SDS: 5
CHL CUP NUCLEAR SRS: 11
CHL CUP RESTING HR STRESS: 60 {beats}/min
LHR: 0.26
LV dias vol: 119 mL (ref 62–150)
LV sys vol: 57 mL
NUC STRESS TID: 1.07
Peak HR: 70 {beats}/min
SSS: 16

## 2016-06-08 MED ORDER — SODIUM CHLORIDE 0.9% FLUSH
INTRAVENOUS | Status: AC
  Administered 2016-06-08: 10 mL via INTRAVENOUS
  Filled 2016-06-08: qty 10

## 2016-06-08 MED ORDER — REGADENOSON 0.4 MG/5ML IV SOLN
INTRAVENOUS | Status: AC
  Administered 2016-06-08: 0.4 mg via INTRAVENOUS
  Filled 2016-06-08: qty 5

## 2016-06-08 MED ORDER — TECHNETIUM TC 99M TETROFOSMIN IV KIT
30.0000 | PACK | Freq: Once | INTRAVENOUS | Status: AC | PRN
  Administered 2016-06-08: 30 via INTRAVENOUS

## 2016-06-08 MED ORDER — TECHNETIUM TC 99M TETROFOSMIN IV KIT
10.0000 | PACK | Freq: Once | INTRAVENOUS | Status: AC | PRN
  Administered 2016-06-08: 10 via INTRAVENOUS

## 2016-06-10 ENCOUNTER — Telehealth: Payer: Self-pay | Admitting: *Deleted

## 2016-06-10 NOTE — Telephone Encounter (Signed)
Notes Recorded by Laurine Blazer, LPN on [id number] at 075-GRM PM EDT Patient notified and verbalized understanding. Copy to pmd. Appointment scheduled with Dr. [name] for Friday, 06/17/2016 at 9:30 in [place] office.   ------  Notes Recorded by Dionicio Stall, RN on 06/09/2016 at 8:56 AM EDT Patient has follow up scheduled for 05/19/16 ------  Notes Recorded by Thompson Grayer, MD on 06/09/2016 at 7:57 AM EDT Results reviewed. Claiborne Mcquown, please inform pt of result. I worry about the large area of scar. I think we should set him up for cath. Please bring him in for discussion. I will route to primary care also.

## 2016-06-16 ENCOUNTER — Encounter: Payer: Self-pay | Admitting: *Deleted

## 2016-06-17 ENCOUNTER — Encounter: Payer: Self-pay | Admitting: Internal Medicine

## 2016-06-17 ENCOUNTER — Encounter: Payer: Self-pay | Admitting: *Deleted

## 2016-06-17 ENCOUNTER — Ambulatory Visit (INDEPENDENT_AMBULATORY_CARE_PROVIDER_SITE_OTHER): Payer: BLUE CROSS/BLUE SHIELD | Admitting: Internal Medicine

## 2016-06-17 VITALS — BP 112/84 | HR 71 | Ht 73.0 in | Wt 201.0 lb

## 2016-06-17 DIAGNOSIS — Z950 Presence of cardiac pacemaker: Secondary | ICD-10-CM | POA: Diagnosis not present

## 2016-06-17 DIAGNOSIS — R0789 Other chest pain: Secondary | ICD-10-CM

## 2016-06-17 DIAGNOSIS — I10 Essential (primary) hypertension: Secondary | ICD-10-CM | POA: Diagnosis not present

## 2016-06-17 DIAGNOSIS — R0602 Shortness of breath: Secondary | ICD-10-CM

## 2016-06-17 DIAGNOSIS — I482 Chronic atrial fibrillation, unspecified: Secondary | ICD-10-CM

## 2016-06-17 DIAGNOSIS — I495 Sick sinus syndrome: Secondary | ICD-10-CM | POA: Diagnosis not present

## 2016-06-17 DIAGNOSIS — I119 Hypertensive heart disease without heart failure: Secondary | ICD-10-CM

## 2016-06-17 LAB — CUP PACEART INCLINIC DEVICE CHECK
Battery Voltage: 2.95 V
Brady Statistic RA Percent Paced: 0 %
Date Time Interrogation Session: 20171006132512
Implantable Lead Implant Date: 20130801
Implantable Lead Location: 753859
Implantable Lead Location: 753860
Lead Channel Impedance Value: 650 Ohm
Lead Channel Pacing Threshold Amplitude: 0.5 V
Lead Channel Pacing Threshold Pulse Width: 0.4 ms
Lead Channel Sensing Intrinsic Amplitude: 12 mV
Lead Channel Sensing Intrinsic Amplitude: 3.6 mV
Lead Channel Setting Pacing Amplitude: 2.5 V
MDC IDC LEAD IMPLANT DT: 20130801
MDC IDC LEAD MODEL: 1948
MDC IDC MSMT BATTERY REMAINING LONGEVITY: 115.2
MDC IDC MSMT LEADCHNL RV PACING THRESHOLD AMPLITUDE: 0.5 V
MDC IDC MSMT LEADCHNL RV PACING THRESHOLD PULSEWIDTH: 0.4 ms
MDC IDC SET LEADCHNL RV PACING PULSEWIDTH: 0.4 ms
MDC IDC SET LEADCHNL RV SENSING SENSITIVITY: 2 mV
MDC IDC STAT BRADY RV PERCENT PACED: 89 %
Pulse Gen Model: 2210
Pulse Gen Serial Number: 7377947

## 2016-06-17 NOTE — Patient Instructions (Signed)
Medication Instructions:  Continue all current medications.  Labwork: Will do same day as cath.   Testing/Procedures: Your physician has requested that you have a cardiac catheterization. Cardiac catheterization is used to diagnose and/or treat various heart conditions. Doctors may recommend this procedure for a number of different reasons. The most common reason is to evaluate chest pain. Chest pain can be a symptom of coronary artery disease (CAD), and cardiac catheterization can show whether plaque is narrowing or blocking your heart's arteries. This procedure is also used to evaluate the valves, as well as measure the blood flow and oxygen levels in different parts of your heart. For further information please visit [email or web address]. Please follow instruction sheet, as given.  Follow-Up: To be given at time of discharge, typically 2-3 weeks.    Any Other Special Instructions Will Be Listed Below (If Applicable).  If you need a refill on your cardiac medications before your next appointment, please call your pharmacy.

## 2016-06-17 NOTE — Progress Notes (Signed)
PCP: Neale Burly, MD  Paul Dunn is a 65 y.o. male who presents today for routine electrophysiology followup.  He continues to have SOB. 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  He describes this as cramping across his chest.  He underwent myoview recently which revealed large inferior defect.  He presents today to discuss this further.  Today, he denies symptoms of  dizziness, presyncope, or syncope.  The patient is otherwise without complaint today.   Past Medical History:  Diagnosis Date  . Asthma    "when I was a child"  . Atrial fibrillation ([place])    long standing persistent  . GERD (gastroesophageal reflux disease)   . Gouty arthritis   . H/O hiatal hernia   . High cholesterol   . History of blood transfusion ~ 1984  . History of bronchitis    "used to get it twice/year; last time was ~ 2 yr ago" (04/12/2012)  . HTN (hypertension)   . Kidney cysts    "never treated it"  . Peripheral vascular disease ([place])   . Pulmonary nodule   . 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 Type 2 diabetes mellitus ([place])    Past Surgical History:  Procedure Laterality Date  . ESOPHAGEAL DILATION  01/2012  . LACERATION REPAIR  ~ 1984   left forearm; "cut it w/a power saw"  . MELANOMA EXCISION  ~ 2011   left posterior shoulder  . PACEMAKER INSERTION  04/12/2012   SJM Accent DR RF pacemaker implanted by Dr [name] for tachy/brady syndrome  . PERMANENT PACEMAKER INSERTION N/A 04/12/2012   Procedure: PERMANENT PACEMAKER INSERTION;  Surgeon: Thompson Grayer, MD;  Location: [facility] CATH LAB;  Service: Cardiovascular;  Laterality: N/A;    Current Outpatient Prescriptions  Medication Sig Dispense Refill  . allopurinol (ZYLOPRIM) 300 MG tablet Take 300 mg by mouth daily.    . Cinnamon 500 MG TABS Take 2 tablets by mouth daily.    Marland Kitchen diltiazem (DILACOR XR) 180 MG 24 hr capsule TAKE TWO CAPSULES EACH DAY 180 capsule 1  . glipiZIDE (GLUCOTROL XL)  2.5 MG 24 hr tablet Take 2.5 mg by mouth daily.    Marland Kitchen lisinopril-hydrochlorothiazide (PRINZIDE,ZESTORETIC) 20-12.5 MG tablet Take 1 tablet by mouth daily.    . metFORMIN (GLUCOPHAGE) 500 MG tablet Take 500 mg by mouth daily with breakfast.    . Omega-3 Fatty Acids (FISH OIL) 1000 MG CAPS Take 1 capsule by mouth daily. 1200 mg    . pantoprazole (PROTONIX) 40 MG tablet Take 40 mg by mouth daily.    . tadalafil (CIALIS) 5 MG tablet Take 5 mg by mouth daily as needed.    . warfarin (COUMADIN) 5 MG tablet Take 5 mg by mouth daily. As Directed.    . Zinc 50 MG TABS Take 1 tablet by mouth daily.     No current facility-administered medications for this visit.     Physical Exam: Vitals:   06/17/16 0954  BP: 112/84  Pulse: 71  SpO2: 98%  Weight: 201 lb (91.2 kg)  Height: 6\' 1"  (1.854 m)    GEN- The patient is well appearing, alert and oriented x 3 today.   Head- normocephalic, atraumatic Eyes-  Sclera clear, conjunctiva pink Ears- hearing intact Oropharynx- clear Lungs- Clear to ausculation bilaterally, normal work of breathing Chest- pacemaker pocket is well healed Heart- Regular rate and rhythm (paced) GI- soft, NT, ND, + BS Extremities- no  clubbing, cyanosis, or edema  Pacemaker interrogation- reviewed in detail today,  See PACEART report  Assessment and Plan:  1. Symptomatic Bradycardia Normal pacemaker function See Pace Art report Followed with Merlin  2. Longstanding persistent afib He presently is not interested in initiation of tikosyn or any other AAD.  He wishes to continue rate control Continue coumadin for anticoagulation  Echo reviewed  3. Hypertensive cardiovascular disease Stable No change required today 2 gram sodium restriction  4. Chest pain/ SOB Both typical and atypical features Recent myoview is abnormal.  I would therefore recommend left heart catheterization with possible PCI.  Discussed the cath with the patient. The patient understands that risks  included but are not limited to stroke (1 in 1000), death (1 in 15), kidney failure [usually temporary] (1 in 500), bleeding (1 in 200), allergic reaction [possibly serious] (1 in 200). The patient understands and agrees to proceed.  Will hold coumadin prior to cath.  Hold metformin.    Follow-up with me 3-4 weeks post cath.  Thompson Grayer MD, [facility] 06/17/2016 1:18 PM

## 2016-06-21 ENCOUNTER — Encounter: Payer: Self-pay | Admitting: *Deleted

## 2016-06-21 ENCOUNTER — Other Ambulatory Visit: Payer: Self-pay | Admitting: *Deleted

## 2016-06-21 DIAGNOSIS — Z01812 Encounter for preprocedural laboratory examination: Secondary | ICD-10-CM

## 2016-06-21 DIAGNOSIS — I10 Essential (primary) hypertension: Secondary | ICD-10-CM

## 2016-06-21 LAB — PROTIME-INR

## 2016-06-23 ENCOUNTER — Ambulatory Visit (HOSPITAL_COMMUNITY)
Admission: RE | Admit: 2016-06-23 | Discharge: 2016-06-23 | Disposition: A | Discharge status: Home / Self Care | Payer: BLUE CROSS/BLUE SHIELD | Source: Ambulatory Visit | Attending: Internal Medicine | Admitting: Internal Medicine

## 2016-06-23 ENCOUNTER — Encounter (HOSPITAL_COMMUNITY): Payer: Self-pay | Admitting: *Deleted

## 2016-06-23 ENCOUNTER — Encounter (HOSPITAL_COMMUNITY)
Admission: RE | Disposition: A | Discharge status: Home / Self Care | Payer: Self-pay | Source: Ambulatory Visit | Attending: Internal Medicine

## 2016-06-23 DIAGNOSIS — K449 Diaphragmatic hernia without obstruction or gangrene: Secondary | ICD-10-CM | POA: Insufficient documentation

## 2016-06-23 DIAGNOSIS — Z7984 Long term (current) use of oral hypoglycemic drugs: Secondary | ICD-10-CM | POA: Insufficient documentation

## 2016-06-23 DIAGNOSIS — Z950 Presence of cardiac pacemaker: Secondary | ICD-10-CM | POA: Diagnosis not present

## 2016-06-23 DIAGNOSIS — I481 Persistent atrial fibrillation: Secondary | ICD-10-CM | POA: Diagnosis not present

## 2016-06-23 DIAGNOSIS — E7800 Pure hypercholesterolemia, unspecified: Secondary | ICD-10-CM | POA: Diagnosis not present

## 2016-06-23 DIAGNOSIS — I119 Hypertensive heart disease without heart failure: Secondary | ICD-10-CM | POA: Diagnosis not present

## 2016-06-23 DIAGNOSIS — M109 Gout, unspecified: Secondary | ICD-10-CM | POA: Insufficient documentation

## 2016-06-23 DIAGNOSIS — Z7901 Long term (current) use of anticoagulants: Secondary | ICD-10-CM | POA: Insufficient documentation

## 2016-06-23 DIAGNOSIS — I2510 Atherosclerotic heart disease of native coronary artery without angina pectoris: Secondary | ICD-10-CM | POA: Insufficient documentation

## 2016-06-23 DIAGNOSIS — J45909 Unspecified asthma, uncomplicated: Secondary | ICD-10-CM | POA: Insufficient documentation

## 2016-06-23 DIAGNOSIS — E1151 Type 2 diabetes mellitus with diabetic peripheral angiopathy without gangrene: Secondary | ICD-10-CM | POA: Diagnosis not present

## 2016-06-23 DIAGNOSIS — R9439 Abnormal result of other cardiovascular function study: Secondary | ICD-10-CM | POA: Diagnosis present

## 2016-06-23 DIAGNOSIS — R079 Chest pain, unspecified: Secondary | ICD-10-CM | POA: Diagnosis present

## 2016-06-23 DIAGNOSIS — K219 Gastro-esophageal reflux disease without esophagitis: Secondary | ICD-10-CM | POA: Insufficient documentation

## 2016-06-23 HISTORY — PX: CARDIAC CATHETERIZATION: SHX172

## 2016-06-23 LAB — PROTIME-INR
INR: 1.16
PROTHROMBIN TIME: 14.9 s (ref 11.4–15.2)

## 2016-06-23 LAB — GLUCOSE, CAPILLARY: Glucose-Capillary: 101 mg/dL — ABNORMAL HIGH (ref 65–99)

## 2016-06-23 SURGERY — LEFT HEART CATH AND CORONARY ANGIOGRAPHY
Anesthesia: LOCAL

## 2016-06-23 MED ORDER — FENTANYL CITRATE (PF) 100 MCG/2ML IJ SOLN
INTRAMUSCULAR | Status: DC | PRN
  Administered 2016-06-23: 50 ug via INTRAVENOUS

## 2016-06-23 MED ORDER — MIDAZOLAM HCL 2 MG/2ML IJ SOLN
INTRAMUSCULAR | Status: DC | PRN
  Administered 2016-06-23: 1 mg via INTRAVENOUS

## 2016-06-23 MED ORDER — HEPARIN (PORCINE) IN NACL 2-0.9 UNIT/ML-% IJ SOLN
INTRAMUSCULAR | Status: DC | PRN
  Administered 2016-06-23: 1500 mL

## 2016-06-23 MED ORDER — ROSUVASTATIN CALCIUM 5 MG PO TABS: 5.0000 mg | ORAL_TABLET | ORAL | 5 refills | Status: DC

## 2016-06-23 MED ORDER — SODIUM CHLORIDE 0.9 % WEIGHT BASED INFUSION
3.0000 mL/kg/h | INTRAVENOUS | Status: AC
  Administered 2016-06-23: 3 mL/kg/h via INTRAVENOUS

## 2016-06-23 MED ORDER — FENTANYL CITRATE (PF) 100 MCG/2ML IJ SOLN
INTRAMUSCULAR | Status: AC
  Filled 2016-06-23: qty 2

## 2016-06-23 MED ORDER — LIDOCAINE HCL (PF) 1 % IJ SOLN
INTRAMUSCULAR | Status: AC
  Filled 2016-06-23: qty 30

## 2016-06-23 MED ORDER — SODIUM CHLORIDE 0.9 % IV SOLN: INTRAVENOUS | Status: DC

## 2016-06-23 MED ORDER — ASPIRIN 81 MG PO CHEW
81.0000 mg | CHEWABLE_TABLET | ORAL | Status: AC
  Administered 2016-06-23: 81 mg via ORAL

## 2016-06-23 MED ORDER — VERAPAMIL HCL 2.5 MG/ML IV SOLN
INTRAVENOUS | Status: DC | PRN
  Administered 2016-06-23: 10 mL via INTRA_ARTERIAL

## 2016-06-23 MED ORDER — SODIUM CHLORIDE 0.9% FLUSH: 3.0000 mL | INTRAVENOUS | Status: DC | PRN

## 2016-06-23 MED ORDER — HEPARIN (PORCINE) IN NACL 2-0.9 UNIT/ML-% IJ SOLN
INTRAMUSCULAR | Status: AC
  Filled 2016-06-23: qty 1000

## 2016-06-23 MED ORDER — LIDOCAINE HCL (PF) 1 % IJ SOLN
INTRAMUSCULAR | Status: DC | PRN
  Administered 2016-06-23: 3 mL

## 2016-06-23 MED ORDER — IOPAMIDOL (ISOVUE-370) INJECTION 76%
INTRAVENOUS | Status: AC
  Filled 2016-06-23: qty 100

## 2016-06-23 MED ORDER — ASPIRIN 81 MG PO CHEW
CHEWABLE_TABLET | ORAL | Status: AC
  Administered 2016-06-23: 81 mg via ORAL
  Filled 2016-06-23: qty 1

## 2016-06-23 MED ORDER — SODIUM CHLORIDE 0.9 % IV SOLN: 250.0000 mL | INTRAVENOUS | Status: DC | PRN

## 2016-06-23 MED ORDER — VERAPAMIL HCL 2.5 MG/ML IV SOLN
INTRAVENOUS | Status: AC
  Filled 2016-06-23: qty 2

## 2016-06-23 MED ORDER — SODIUM CHLORIDE 0.9% FLUSH: 3.0000 mL | Freq: Two times a day (BID) | INTRAVENOUS | Status: DC

## 2016-06-23 MED ORDER — IOPAMIDOL (ISOVUE-370) INJECTION 76%
INTRAVENOUS | Status: DC | PRN
  Administered 2016-06-23: 70 mL via INTRAVENOUS

## 2016-06-23 MED ORDER — SODIUM CHLORIDE 0.9 % WEIGHT BASED INFUSION: 1.0000 mL/kg/h | INTRAVENOUS | Status: DC

## 2016-06-23 MED ORDER — HEPARIN SODIUM (PORCINE) 1000 UNIT/ML IJ SOLN
INTRAMUSCULAR | Status: DC | PRN
  Administered 2016-06-23: 4500 [IU] via INTRAVENOUS

## 2016-06-23 MED ORDER — MIDAZOLAM HCL 2 MG/2ML IJ SOLN
INTRAMUSCULAR | Status: AC
  Filled 2016-06-23: qty 2

## 2016-06-23 MED ORDER — ISOSORBIDE MONONITRATE ER 30 MG PO TB24: 15.0000 mg | ORAL_TABLET | Freq: Every day | ORAL | 5 refills | Status: DC

## 2016-06-23 MED ORDER — METFORMIN HCL 500 MG PO TABS
500.0000 mg | ORAL_TABLET | Freq: Every day | ORAL | Status: DC
Start: 2016-06-25 — End: 2021-06-18

## 2016-06-23 SURGICAL SUPPLY — 12 items
CATH INFINITI 5 FR JL3.5 (CATHETERS) ×1 IMPLANT
CATH INFINITI 5FR ANG PIGTAIL (CATHETERS) ×1 IMPLANT
CATH INFINITI JR4 5F (CATHETERS) ×1 IMPLANT
DEVICE RAD COMP TR BAND LRG (VASCULAR PRODUCTS) ×1 IMPLANT
GLIDESHEATH SLEND SS 6F .021 (SHEATH) ×1 IMPLANT
GUIDEWIRE 3MM J .035 260CM (WIRE) ×1 IMPLANT
KIT HEART LEFT (KITS) ×2 IMPLANT
PACK CARDIAC CATHETERIZATION (CUSTOM PROCEDURE TRAY) ×2 IMPLANT
SYR MEDRAD MARK V 150ML (SYRINGE) ×2 IMPLANT
TRANSDUCER W/STOPCOCK (MISCELLANEOUS) ×2 IMPLANT
TUBING CIL FLEX 10 FLL-RA (TUBING) ×2 IMPLANT
WIRE HI TORQ VERSACORE-J 145CM (WIRE) ×1 IMPLANT

## 2016-06-23 NOTE — Discharge Instructions (Signed)
Radial Site Care °Refer to this sheet in the next few weeks. These instructions provide you with information about caring for yourself after your procedure. Your health care provider may also give you more specific instructions. Your treatment has been planned according to current medical practices, but problems sometimes occur. Call your health care provider if you have any problems or questions after your procedure. °WHAT TO EXPECT AFTER THE PROCEDURE °After your procedure, it is typical to have the following: °· Bruising at the radial site that usually fades within 1-2 weeks. °· Blood collecting in the tissue (hematoma) that may be painful to the touch. It should usually decrease in size and tenderness within 1-2 weeks. °HOME CARE INSTRUCTIONS °· Take medicines only as directed by your health care provider. °· You may shower 24-48 hours after the procedure or as directed by your health care provider. Remove the bandage (dressing) and gently wash the site with plain soap and water. Pat the area dry with a clean towel. Do not rub the site, because this may cause bleeding. °· Do not take baths, swim, or use a hot tub until your health care provider approves. °· Check your insertion site every day for redness, swelling, or drainage. °· Do not apply powder or lotion to the site. °· Do not flex or bend the affected arm for 24 hours or as directed by your health care provider. °· Do not push or pull heavy objects with the affected arm for 24 hours or as directed by your health care provider. °· Do not lift over 10 lb (4.5 kg) for 5 days after your procedure or as directed by your health care provider. °· Ask your health care provider when it is okay to: °¨ Return to work or school. °¨ Resume usual physical activities or sports. °¨ Resume sexual activity. °· Do not drive home if you are discharged the same day as the procedure. Have someone else drive you. °· You may drive 24 hours after the procedure unless otherwise  instructed by your health care provider. °· Do not operate machinery or power tools for 24 hours after the procedure. °· If your procedure was done as an outpatient procedure, which means that you went home the same day as your procedure, a responsible adult should be with you for the first 24 hours after you arrive home. °· Keep all follow-up visits as directed by your health care provider. This is important. °SEEK MEDICAL CARE IF: °· You have a fever. °· You have chills. °· You have increased bleeding from the radial site. Hold pressure on the site. °SEEK IMMEDIATE MEDICAL CARE IF: °· You have unusual pain at the radial site. °· You have redness, warmth, or swelling at the radial site. °· You have drainage (other than a small amount of blood on the dressing) from the radial site. °· The radial site is bleeding, and the bleeding does not stop after 30 minutes of holding steady pressure on the site. °· Your arm or hand becomes pale, cool, tingly, or numb. °  °This information is not intended to replace advice given to you by your health care provider. Make sure you discuss any questions you have with your health care provider. °  °Document Released: 10/01/2010 Document Revised: 09/19/2014 Document Reviewed: 03/17/2014 °Elsevier Interactive Patient Education ©2016 Else[employer] Inc[employer] ° °

## 2016-06-23 NOTE — Interval H&P Note (Signed)
History and Physical Interval Note:  06/23/2016 7:27 AM  Paul Dunn  has presented today for cardiac catheterization, with the diagnosis of Abnormal Stress Test/SOB  The various methods of treatment have been discussed with the patient and family. After consideration of risks, benefits and other options for treatment, the patient has consented to  Procedure(s): Left Heart Cath and Coronary Angiography (N/A) as a surgical intervention .  The patient's history has been reviewed, patient examined, no change in status, stable for surgery.  I have reviewed the patient's chart and labs.  Questions were answered to the patient's satisfaction.    Cath Lab Visit (complete for each Cath Lab visit)  Clinical Evaluation Leading to the Procedure:   ACS: No.  Non-ACS:    Anginal Classification: CCS II  Anti-ischemic medical therapy: Minimal Therapy (1 class of medications)  Non-Invasive Test Results: Intermediate-risk stress test findings: cardiac mortality 1-3%/year  Prior CABG: No previous CABG    Paul Dunn

## 2016-06-23 NOTE — H&P (View-Only) (Signed)
PCP: Neale Burly, MD  Paul Dunn is a 65 y.o. male who presents today for routine electrophysiology followup.  He continues to have SOB. 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  He describes this as cramping across his chest.  He underwent myoview recently which revealed large inferior defect.  He presents today to discuss this further.  Today, he denies symptoms of  dizziness, presyncope, or syncope.  The patient is otherwise without complaint today.   Past Medical History:  Diagnosis Date  . Asthma    "when I was a child"  . Atrial fibrillation ([place])    long standing persistent  . GERD (gastroesophageal reflux disease)   . Gouty arthritis   . H/O hiatal hernia   . High cholesterol   . History of blood transfusion ~ 1984  . History of bronchitis    "used to get it twice/year; last time was ~ 2 yr ago" (04/12/2012)  . HTN (hypertension)   . Kidney cysts    "never treated it"  . Peripheral vascular disease ([place])   . Pulmonary nodule   . 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 Type 2 diabetes mellitus ([place])    Past Surgical History:  Procedure Laterality Date  . ESOPHAGEAL DILATION  01/2012  . LACERATION REPAIR  ~ 1984   left forearm; "cut it w/a power saw"  . MELANOMA EXCISION  ~ 2011   left posterior shoulder  . PACEMAKER INSERTION  04/12/2012   SJM Accent DR RF pacemaker implanted by Dr [name] for tachy/brady syndrome  . PERMANENT PACEMAKER INSERTION N/A 04/12/2012   Procedure: PERMANENT PACEMAKER INSERTION;  Surgeon: Thompson Grayer, MD;  Location: [facility] CATH LAB;  Service: Cardiovascular;  Laterality: N/A;    Current Outpatient Prescriptions  Medication Sig Dispense Refill  . allopurinol (ZYLOPRIM) 300 MG tablet Take 300 mg by mouth daily.    . Cinnamon 500 MG TABS Take 2 tablets by mouth daily.    Marland Kitchen diltiazem (DILACOR XR) 180 MG 24 hr capsule TAKE TWO CAPSULES EACH DAY 180 capsule 1  . glipiZIDE (GLUCOTROL XL)  2.5 MG 24 hr tablet Take 2.5 mg by mouth daily.    Marland Kitchen lisinopril-hydrochlorothiazide (PRINZIDE,ZESTORETIC) 20-12.5 MG tablet Take 1 tablet by mouth daily.    . metFORMIN (GLUCOPHAGE) 500 MG tablet Take 500 mg by mouth daily with breakfast.    . Omega-3 Fatty Acids (FISH OIL) 1000 MG CAPS Take 1 capsule by mouth daily. 1200 mg    . pantoprazole (PROTONIX) 40 MG tablet Take 40 mg by mouth daily.    . tadalafil (CIALIS) 5 MG tablet Take 5 mg by mouth daily as needed.    . warfarin (COUMADIN) 5 MG tablet Take 5 mg by mouth daily. As Directed.    . Zinc 50 MG TABS Take 1 tablet by mouth daily.     No current facility-administered medications for this visit.     Physical Exam: Vitals:   06/17/16 0954  BP: 112/84  Pulse: 71  SpO2: 98%  Weight: 201 lb (91.2 kg)  Height: 6\' 1"  (1.854 m)    GEN- The patient is well appearing, alert and oriented x 3 today.   Head- normocephalic, atraumatic Eyes-  Sclera clear, conjunctiva pink Ears- hearing intact Oropharynx- clear Lungs- Clear to ausculation bilaterally, normal work of breathing Chest- pacemaker pocket is well healed Heart- Regular rate and rhythm (paced) GI- soft, NT, ND, + BS Extremities- no  clubbing, cyanosis, or edema  Pacemaker interrogation- reviewed in detail today,  See PACEART report  Assessment and Plan:  1. Symptomatic Bradycardia Normal pacemaker function See Pace Art report Followed with Merlin  2. Longstanding persistent afib He presently is not interested in initiation of tikosyn or any other AAD.  He wishes to continue rate control Continue coumadin for anticoagulation  Echo reviewed  3. Hypertensive cardiovascular disease Stable No change required today 2 gram sodium restriction  4. Chest pain/ SOB Both typical and atypical features Recent myoview is abnormal.  I would therefore recommend left heart catheterization with possible PCI.  Discussed the cath with the patient. The patient understands that risks  included but are not limited to stroke (1 in 1000), death (1 in 51), kidney failure [usually temporary] (1 in 500), bleeding (1 in 200), allergic reaction [possibly serious] (1 in 200). The patient understands and agrees to proceed.  Will hold coumadin prior to cath.  Hold metformin.    Follow-up with me 3-4 weeks post cath.  Thompson Grayer MD, [facility] 06/17/2016 1:18 PM

## 2016-06-24 MED FILL — Lidocaine HCl Local Preservative Free (PF) Inj 1%: INTRAMUSCULAR | Qty: 30 | Status: AC

## 2016-08-12 DIAGNOSIS — Z7901 Long term (current) use of anticoagulants: Secondary | ICD-10-CM | POA: Diagnosis not present

## 2016-08-16 ENCOUNTER — Telehealth: Payer: Self-pay | Admitting: Internal Medicine

## 2016-08-16 NOTE — Telephone Encounter (Signed)
Patient calling to find out if clearance was sent to eye doctor so that he can proceed with surgery

## 2016-08-17 NOTE — Telephone Encounter (Signed)
Patient informed that clearance form had been faxed back to [facility] on 08/02/2016.  Stated that he had also just received a call that everything was good to go .    Also, mentioned to patient that he never had post cath visit (cath 06/23/16).  Patient stated that no one had mentioned this to him upon his hospital discharge.  Dr. Saunders Revel started him on Imdur 30 - 1/2 tab daily & Crestor 5mg  daily at that visit.  Stated that he only took one dose of the Imdur due to giving him a really bad headache.  Explained to him that typically this will subside once he adjusts to this new medication.  Suggested that he at least try to restart again & give it a little bit more time before stopping & to let us know.  He is still taking the Crestor without any problems.  Stated he is doing good otherwise.  No c/o chest pain, dizziness, or SOB.  Also, stated that he has been walking about 2 miles a day.  Message sent to provider to see when you want to follow up with patient and any further suggestions.  Next follow up is scheduled for 05/2017.

## 2016-08-18 DIAGNOSIS — H25812 Combined forms of age-related cataract, left eye: Secondary | ICD-10-CM | POA: Diagnosis not present

## 2016-08-18 DIAGNOSIS — Z961 Presence of intraocular lens: Secondary | ICD-10-CM | POA: Diagnosis not present

## 2016-08-18 DIAGNOSIS — H2512 Age-related nuclear cataract, left eye: Secondary | ICD-10-CM | POA: Diagnosis not present

## 2016-08-19 NOTE — Telephone Encounter (Signed)
Per my precath note, he was to see me 3-4 weeks post cath.  Not sure why this was not scheduled.  Please schedule follow-up with me the next time I am in [place].

## 2016-08-22 NOTE — Telephone Encounter (Signed)
Will forward to schedulers

## 2016-08-23 NOTE — Telephone Encounter (Signed)
Patient notified

## 2016-08-23 NOTE — Telephone Encounter (Signed)
Pt scheduled 09/16/16

## 2016-09-01 DIAGNOSIS — J4530 Mild persistent asthma, uncomplicated: Secondary | ICD-10-CM | POA: Diagnosis not present

## 2016-09-01 DIAGNOSIS — M5431 Sciatica, right side: Secondary | ICD-10-CM | POA: Diagnosis not present

## 2016-09-07 DIAGNOSIS — L570 Actinic keratosis: Secondary | ICD-10-CM | POA: Diagnosis not present

## 2016-09-07 DIAGNOSIS — X32XXXD Exposure to sunlight, subsequent encounter: Secondary | ICD-10-CM | POA: Diagnosis not present

## 2016-09-07 DIAGNOSIS — L700 Acne vulgaris: Secondary | ICD-10-CM | POA: Diagnosis not present

## 2016-09-07 DIAGNOSIS — D2221 Melanocytic nevi of right ear and external auricular canal: Secondary | ICD-10-CM | POA: Diagnosis not present

## 2016-09-15 DIAGNOSIS — H2511 Age-related nuclear cataract, right eye: Secondary | ICD-10-CM | POA: Diagnosis not present

## 2016-09-15 DIAGNOSIS — H25811 Combined forms of age-related cataract, right eye: Secondary | ICD-10-CM | POA: Diagnosis not present

## 2016-09-16 ENCOUNTER — Encounter: Payer: Self-pay | Admitting: Internal Medicine

## 2016-09-16 ENCOUNTER — Ambulatory Visit (INDEPENDENT_AMBULATORY_CARE_PROVIDER_SITE_OTHER): Payer: Medicare Other | Admitting: Internal Medicine

## 2016-09-16 VITALS — BP 151/94 | HR 75

## 2016-09-16 DIAGNOSIS — I482 Chronic atrial fibrillation, unspecified: Secondary | ICD-10-CM

## 2016-09-16 DIAGNOSIS — Z950 Presence of cardiac pacemaker: Secondary | ICD-10-CM

## 2016-09-16 DIAGNOSIS — I495 Sick sinus syndrome: Secondary | ICD-10-CM | POA: Diagnosis not present

## 2016-09-16 NOTE — Progress Notes (Signed)
PCP: Neale Burly, MD  Paul Dunn is a 66 y.o. male who presents today for routine electrophysiology followup. Doing well.  Hunting without SOB or CP.  Did not tolerate imdur due to extreme headache.    Today, he denies symptoms of  dizziness, presyncope, or syncope.  The patient is otherwise without complaint today.   Past Medical History:  Diagnosis Date  . Asthma    "when I was a child"  . Atrial fibrillation ([place])    long standing persistent  . GERD (gastroesophageal reflux disease)   . Gouty arthritis   . H/O hiatal hernia   . High cholesterol   . History of blood transfusion ~ 1984  . History of bronchitis    "used to get it twice/year; last time was ~ 2 yr ago" (04/12/2012)  . HTN (hypertension)   . Kidney cysts    "never treated it"  . Peripheral vascular disease ([place])   . Pulmonary nodule   . 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 Type 2 diabetes mellitus ([place])    Past Surgical History:  Procedure Laterality Date  . CARDIAC CATHETERIZATION N/A 06/23/2016   Procedure: Left Heart Cath and Coronary Angiography;  Surgeon: Nelva Bush, MD;  Location: [place] [facility];  Service: Cardiovascular;  Laterality: N/A;  . ESOPHAGEAL DILATION  01/2012  . LACERATION REPAIR  ~ 1984   left forearm; "cut it w/a power saw"  . MELANOMA EXCISION  ~ 2011   left posterior shoulder  . PACEMAKER INSERTION  04/12/2012   SJM Acc[employer] DR RF pacemaker implanted by Dr [name] for tachy/brady syndrome  . PERMANENT PACEMAKER INSERTION N/A 04/12/2012   Procedure: PERMANENT PACEMAKER INSERTION;  Surgeon: Thompson Grayer, MD;  Location: [facility] CATH LAB;  Service: Cardiovascular;  Laterality: N/A;    Current Outpatient Prescriptions  Medication Sig Dispense Refill  . allopurinol (ZYLOPRIM) 300 MG tablet Take 300 mg by mouth daily.    . cholecalciferol (VITAMIN D) 1000 units tablet Take 1,000 Units by mouth every evening.    . Cinnamon 500 MG TABS Take  1,000 mg by mouth daily.     . clonazePAM (KLONOPIN) 1 MG tablet Take 1 mg by mouth at bedtime.    Marland Kitchen diltiazem (DILACOR XR) 180 MG 24 hr capsule TAKE TWO CAPSULES EACH DAY (Patient taking differently: TAKE TWO CAPSULES (360 MG) DAILY) 180 capsule 1  . glipiZIDE (GLUCOTROL XL) 2.5 MG 24 hr tablet Take 2.5 mg by mouth daily.    Marland Kitchen lisinopril-hydrochlorothiazide (PRINZIDE,ZESTORETIC) 20-12.5 MG tablet Take 1 tablet by mouth daily.    . metFORMIN (GLUCOPHAGE) 500 MG tablet Take 1 tablet (500 mg total) by mouth daily with breakfast.    . Omega-3 Fatty Acids (FISH OIL) 1000 MG CAPS Take 1,000 mg by mouth 2 (two) times daily.     . pantoprazole (PROTONIX) 40 MG tablet Take 40 mg by mouth daily.    . rosuvastatin (CRESTOR) 5 MG tablet Take 1 tablet (5 mg total) by mouth every Monday, Wednesday, and Friday. 30 tablet 5  . warfarin (COUMADIN) 5 MG tablet Take 5 mg by mouth at bedtime. As Directed.    . Zinc 50 MG TABS Take 1 tablet by mouth daily.    Vladimir Faster Glycol-Propyl Glycol (SYSTANE ULTRA) 0.4-0.3 % SOLN Apply 1 drop to eye 4 (four) times daily.     No current facility-administered medications for this visit.     Physical Exam: Vitals:  09/16/16 1135  BP: (!) 151/94  Pulse: 75    GEN- The patient is well appearing, alert and oriented x 3 today.   Head- normocephalic, atraumatic Eyes-  Sclera clear, conjunctiva pink Ears- hearing intact Oropharynx- clear Lungs- Clear to ausculation bilaterally, normal work of breathing Chest- pacemaker pocket is well healed Heart- Regular rate and rhythm (paced) GI- soft, NT, ND, + BS Extremities- no clubbing, cyanosis, or edema  Pacemaker interrogation- reviewed in detail today,  See PACEART report  Assessment and Plan:  1. Symptomatic Bradycardia Normal pacemaker function See Pace Art report Followed with Merlin  2. Longstanding persistent afib He presently is not interested in initiation of tikosyn or any other AAD.  He wishes to continue  rate control Continue coumadin for anticoagulation  Echo reviewed  3. Hypertensive cardiovascular disease Stable No change required today 2 gram sodium restriction  4. CAD Cath reviewed Moderate CAD Medical therapy advised Doing well No changes today Lipids upon return  Return in September as scheduled Remote monitoring with Sherrian Divers MD, [facility] 09/16/2016 12:02 PM

## 2016-09-16 NOTE — Patient Instructions (Signed)
Medication Instructions:  Continue all current medications.  Labwork: none  Testing/Procedures: none  Follow-Up: Keep already as planned.   Any Other Special Instructions Will Be Listed Below (If Applicable).  If you need a refill on your cardiac medications before your next appointment, please call your pharmacy.

## 2016-09-19 ENCOUNTER — Encounter: Payer: Medicare Other | Admitting: *Deleted

## 2016-09-19 LAB — CUP PACEART INCLINIC DEVICE CHECK
Brady Statistic RA Percent Paced: 0 %
Brady Statistic RV Percent Paced: 80 %
Date Time Interrogation Session: 20180105173409
Implantable Lead Implant Date: 20130801
Implantable Lead Location: 753859
Implantable Lead Location: 753860
Implantable Lead Model: 1948
Lead Channel Pacing Threshold Amplitude: 0.5 V
Lead Channel Pacing Threshold Pulse Width: 0.4 ms
Lead Channel Sensing Intrinsic Amplitude: 12 mV
Lead Channel Sensing Intrinsic Amplitude: 3.6 mV
Lead Channel Setting Pacing Amplitude: 2.5 V
MDC IDC LEAD IMPLANT DT: 20130801
MDC IDC MSMT BATTERY VOLTAGE: 2.95 V
MDC IDC MSMT LEADCHNL RV IMPEDANCE VALUE: 675 Ohm
MDC IDC MSMT LEADCHNL RV PACING THRESHOLD AMPLITUDE: 0.5 V
MDC IDC MSMT LEADCHNL RV PACING THRESHOLD PULSEWIDTH: 0.4 ms
MDC IDC PG IMPLANT DT: 20130801
MDC IDC SET LEADCHNL RV PACING PULSEWIDTH: 0.4 ms
MDC IDC SET LEADCHNL RV SENSING SENSITIVITY: 2 mV
Pulse Gen Model: 2210
Pulse Gen Serial Number: 7377947

## 2016-09-22 DIAGNOSIS — H04123 Dry eye syndrome of bilateral lacrimal glands: Secondary | ICD-10-CM | POA: Diagnosis not present

## 2016-09-27 DIAGNOSIS — E1142 Type 2 diabetes mellitus with diabetic polyneuropathy: Secondary | ICD-10-CM | POA: Diagnosis not present

## 2016-09-27 DIAGNOSIS — J4530 Mild persistent asthma, uncomplicated: Secondary | ICD-10-CM | POA: Diagnosis not present

## 2016-09-27 DIAGNOSIS — E784 Other hyperlipidemia: Secondary | ICD-10-CM | POA: Diagnosis not present

## 2016-09-27 DIAGNOSIS — M5431 Sciatica, right side: Secondary | ICD-10-CM | POA: Diagnosis not present

## 2016-09-27 DIAGNOSIS — Z0000 Encounter for general adult medical examination without abnormal findings: Secondary | ICD-10-CM | POA: Diagnosis not present

## 2016-09-27 DIAGNOSIS — Z23 Encounter for immunization: Secondary | ICD-10-CM | POA: Diagnosis not present

## 2016-10-12 DIAGNOSIS — H04123 Dry eye syndrome of bilateral lacrimal glands: Secondary | ICD-10-CM | POA: Diagnosis not present

## 2016-11-07 DIAGNOSIS — M5441 Lumbago with sciatica, right side: Secondary | ICD-10-CM | POA: Diagnosis not present

## 2016-11-07 DIAGNOSIS — M9903 Segmental and somatic dysfunction of lumbar region: Secondary | ICD-10-CM | POA: Diagnosis not present

## 2016-11-07 DIAGNOSIS — M47816 Spondylosis without myelopathy or radiculopathy, lumbar region: Secondary | ICD-10-CM | POA: Diagnosis not present

## 2016-11-08 DIAGNOSIS — M5431 Sciatica, right side: Secondary | ICD-10-CM | POA: Diagnosis not present

## 2016-11-09 DIAGNOSIS — M5126 Other intervertebral disc displacement, lumbar region: Secondary | ICD-10-CM | POA: Diagnosis not present

## 2016-11-09 DIAGNOSIS — M48061 Spinal stenosis, lumbar region without neurogenic claudication: Secondary | ICD-10-CM | POA: Diagnosis not present

## 2016-11-09 DIAGNOSIS — M47816 Spondylosis without myelopathy or radiculopathy, lumbar region: Secondary | ICD-10-CM | POA: Diagnosis not present

## 2016-11-09 DIAGNOSIS — M9983 Other biomechanical lesions of lumbar region: Secondary | ICD-10-CM | POA: Diagnosis not present

## 2016-11-09 DIAGNOSIS — M5431 Sciatica, right side: Secondary | ICD-10-CM | POA: Diagnosis not present

## 2016-11-16 ENCOUNTER — Other Ambulatory Visit: Payer: Self-pay | Admitting: Internal Medicine

## 2016-11-16 DIAGNOSIS — M5416 Radiculopathy, lumbar region: Secondary | ICD-10-CM

## 2016-11-21 ENCOUNTER — Telehealth: Payer: Self-pay | Admitting: Internal Medicine

## 2016-11-21 NOTE — Telephone Encounter (Signed)
Pt having upcoming spinal injection and was questioning being bridged with Lovenox - pt coumadin is managed by Dr Freida Busman who sent rx in for Lovenox pt had questions on what exactly Lovenox does - pt given information - pt also told to consult with pharmacist and Dr Freida Busman with further questions since they manage coumadin - pt appreciative of call and information

## 2016-11-21 NOTE — Telephone Encounter (Signed)
Asking to speak to someone about blood thinner

## 2016-11-24 ENCOUNTER — Ambulatory Visit
Admission: RE | Admit: 2016-11-24 | Discharge: 2016-11-24 | Disposition: A | Discharge status: Home / Self Care | Payer: Medicare Other | Source: Ambulatory Visit | Attending: Internal Medicine | Admitting: Internal Medicine

## 2016-11-24 DIAGNOSIS — Z7901 Long term (current) use of anticoagulants: Secondary | ICD-10-CM | POA: Diagnosis not present

## 2016-11-24 DIAGNOSIS — Z5181 Encounter for therapeutic drug level monitoring: Secondary | ICD-10-CM | POA: Diagnosis not present

## 2016-11-24 DIAGNOSIS — M5416 Radiculopathy, lumbar region: Secondary | ICD-10-CM

## 2016-11-24 DIAGNOSIS — M48061 Spinal stenosis, lumbar region without neurogenic claudication: Secondary | ICD-10-CM | POA: Diagnosis not present

## 2016-11-24 MED ORDER — METHYLPREDNISOLONE ACETATE 40 MG/ML INJ SUSP (RADIOLOG
120.0000 mg | Freq: Once | INTRAMUSCULAR | Status: AC
  Administered 2016-11-24: 120 mg via EPIDURAL

## 2016-11-24 MED ORDER — IOPAMIDOL (ISOVUE-M 200) INJECTION 41%
1.0000 mL | Freq: Once | INTRAMUSCULAR | Status: AC
Start: 2016-11-24 — End: 2016-11-24
  Administered 2016-11-24: 1 mL via EPIDURAL

## 2016-11-24 NOTE — Discharge Instructions (Signed)
Post Procedure Spinal Discharge Instruction Sheet  1. You may resume a regular diet and any medications that you routinely take (including pain medications).  2. No driving day of procedure.  3. Light activity throughout the rest of the day.  Do not do any strenuous work, exercise, bending or lifting.  The day following the procedure, you can resume normal physical activity but you should refrain from exercising or physical therapy for at least three days thereafter.   Common Side Effects:   Headaches- take your usual medications as directed by your physician.  Increase your fluid intake.  Caffeinated beverages may be helpful.  Lie flat in bed until your headache resolves.   Restlessness or inability to sleep- you may have trouble sleeping for the next few days.  Ask your referring physician if you need any medication for sleep.   Facial flushing or redness- should subside within a few days.   Increased pain- a temporary increase in pain a day or two following your procedure is not unusual.  Take your pain medication as prescribed by your referring physician.   Leg cramps  Please contact our office at [phone] for the following symptoms:  Fever greater than 100 degrees.  Headaches unresolved with medication after 2-3 days.  Increased swelling, pain, or redness at injection site.  Thank you for visiting our office.    MAY RESUME COUMADIN AND LOVENOX TODAY.

## 2016-11-30 DIAGNOSIS — Z7901 Long term (current) use of anticoagulants: Secondary | ICD-10-CM | POA: Diagnosis not present

## 2016-12-14 DIAGNOSIS — Z7901 Long term (current) use of anticoagulants: Secondary | ICD-10-CM | POA: Diagnosis not present

## 2016-12-19 ENCOUNTER — Telehealth: Payer: Self-pay | Admitting: Cardiology

## 2016-12-19 ENCOUNTER — Ambulatory Visit (INDEPENDENT_AMBULATORY_CARE_PROVIDER_SITE_OTHER): Payer: Medicare Other | Admitting: *Deleted

## 2016-12-19 DIAGNOSIS — I495 Sick sinus syndrome: Secondary | ICD-10-CM | POA: Diagnosis not present

## 2016-12-19 NOTE — Progress Notes (Signed)
Remote pacemaker transmission.   

## 2016-12-19 NOTE — Telephone Encounter (Signed)
LMOVM reminding pt to send remote transmission.   

## 2016-12-21 ENCOUNTER — Encounter: Payer: Self-pay | Admitting: Cardiology

## 2016-12-23 LAB — CUP PACEART REMOTE DEVICE CHECK
Battery Remaining Longevity: 122 mo
Battery Remaining Percentage: 91 %
Date Time Interrogation Session: 20180409165426
Implantable Lead Implant Date: 20130801
Implantable Lead Location: 753859
Implantable Lead Location: 753860
Implantable Pulse Generator Implant Date: 20130801
Lead Channel Pacing Threshold Pulse Width: 0.4 ms
Lead Channel Sensing Intrinsic Amplitude: 12 mV
Lead Channel Setting Sensing Sensitivity: 2 mV
MDC IDC LEAD IMPLANT DT: 20130801
MDC IDC MSMT BATTERY VOLTAGE: 2.95 V
MDC IDC MSMT LEADCHNL RV IMPEDANCE VALUE: 650 Ohm
MDC IDC MSMT LEADCHNL RV PACING THRESHOLD AMPLITUDE: 0.5 V
MDC IDC PG SERIAL: 7377947
MDC IDC SET LEADCHNL RV PACING AMPLITUDE: 2.5 V
MDC IDC SET LEADCHNL RV PACING PULSEWIDTH: 0.4 ms
MDC IDC STAT BRADY RV PERCENT PACED: 69 %
Pulse Gen Model: 2210

## 2017-01-02 DIAGNOSIS — E119 Type 2 diabetes mellitus without complications: Secondary | ICD-10-CM | POA: Diagnosis not present

## 2017-01-02 DIAGNOSIS — Z79899 Other long term (current) drug therapy: Secondary | ICD-10-CM | POA: Diagnosis not present

## 2017-01-02 DIAGNOSIS — M5431 Sciatica, right side: Secondary | ICD-10-CM | POA: Diagnosis not present

## 2017-01-02 DIAGNOSIS — I10 Essential (primary) hypertension: Secondary | ICD-10-CM | POA: Diagnosis not present

## 2017-01-02 DIAGNOSIS — E1142 Type 2 diabetes mellitus with diabetic polyneuropathy: Secondary | ICD-10-CM | POA: Diagnosis not present

## 2017-01-02 DIAGNOSIS — K210 Gastro-esophageal reflux disease with esophagitis: Secondary | ICD-10-CM | POA: Diagnosis not present

## 2017-01-02 DIAGNOSIS — E784 Other hyperlipidemia: Secondary | ICD-10-CM | POA: Diagnosis not present

## 2017-01-05 DIAGNOSIS — H40033 Anatomical narrow angle, bilateral: Secondary | ICD-10-CM | POA: Diagnosis not present

## 2017-01-05 DIAGNOSIS — H43393 Other vitreous opacities, bilateral: Secondary | ICD-10-CM | POA: Diagnosis not present

## 2017-01-13 ENCOUNTER — Encounter (INDEPENDENT_AMBULATORY_CARE_PROVIDER_SITE_OTHER): Payer: Medicare Other | Admitting: Ophthalmology

## 2017-01-13 DIAGNOSIS — H43813 Vitreous degeneration, bilateral: Secondary | ICD-10-CM

## 2017-01-13 DIAGNOSIS — H353112 Nonexudative age-related macular degeneration, right eye, intermediate dry stage: Secondary | ICD-10-CM | POA: Diagnosis not present

## 2017-01-13 DIAGNOSIS — E113291 Type 2 diabetes mellitus with mild nonproliferative diabetic retinopathy without macular edema, right eye: Secondary | ICD-10-CM | POA: Diagnosis not present

## 2017-01-13 DIAGNOSIS — I10 Essential (primary) hypertension: Secondary | ICD-10-CM

## 2017-01-13 DIAGNOSIS — H35033 Hypertensive retinopathy, bilateral: Secondary | ICD-10-CM

## 2017-01-13 DIAGNOSIS — E11319 Type 2 diabetes mellitus with unspecified diabetic retinopathy without macular edema: Secondary | ICD-10-CM | POA: Diagnosis not present

## 2017-01-18 DIAGNOSIS — M4316 Spondylolisthesis, lumbar region: Secondary | ICD-10-CM | POA: Diagnosis not present

## 2017-01-18 DIAGNOSIS — G8929 Other chronic pain: Secondary | ICD-10-CM | POA: Diagnosis not present

## 2017-01-18 DIAGNOSIS — M5441 Lumbago with sciatica, right side: Secondary | ICD-10-CM | POA: Diagnosis not present

## 2017-01-19 ENCOUNTER — Other Ambulatory Visit: Payer: Self-pay | Admitting: Neurosurgery

## 2017-01-19 DIAGNOSIS — I10 Essential (primary) hypertension: Secondary | ICD-10-CM | POA: Diagnosis not present

## 2017-01-19 DIAGNOSIS — M1990 Unspecified osteoarthritis, unspecified site: Secondary | ICD-10-CM | POA: Diagnosis not present

## 2017-01-19 DIAGNOSIS — M109 Gout, unspecified: Secondary | ICD-10-CM | POA: Diagnosis not present

## 2017-01-19 DIAGNOSIS — M4316 Spondylolisthesis, lumbar region: Secondary | ICD-10-CM

## 2017-01-19 DIAGNOSIS — M545 Low back pain: Secondary | ICD-10-CM | POA: Diagnosis not present

## 2017-01-19 DIAGNOSIS — Z7901 Long term (current) use of anticoagulants: Secondary | ICD-10-CM | POA: Diagnosis not present

## 2017-01-19 DIAGNOSIS — Z79899 Other long term (current) drug therapy: Secondary | ICD-10-CM | POA: Diagnosis not present

## 2017-01-19 DIAGNOSIS — E119 Type 2 diabetes mellitus without complications: Secondary | ICD-10-CM | POA: Diagnosis not present

## 2017-01-19 DIAGNOSIS — M2550 Pain in unspecified joint: Secondary | ICD-10-CM | POA: Diagnosis not present

## 2017-01-19 DIAGNOSIS — Z7984 Long term (current) use of oral hypoglycemic drugs: Secondary | ICD-10-CM | POA: Diagnosis not present

## 2017-01-19 DIAGNOSIS — M810 Age-related osteoporosis without current pathological fracture: Secondary | ICD-10-CM | POA: Diagnosis not present

## 2017-01-19 DIAGNOSIS — E7800 Pure hypercholesterolemia, unspecified: Secondary | ICD-10-CM | POA: Diagnosis not present

## 2017-01-19 DIAGNOSIS — M85852 Other specified disorders of bone density and structure, left thigh: Secondary | ICD-10-CM | POA: Diagnosis not present

## 2017-01-19 DIAGNOSIS — I4891 Unspecified atrial fibrillation: Secondary | ICD-10-CM | POA: Diagnosis not present

## 2017-01-20 DIAGNOSIS — M545 Low back pain: Secondary | ICD-10-CM | POA: Diagnosis not present

## 2017-01-20 DIAGNOSIS — M5441 Lumbago with sciatica, right side: Secondary | ICD-10-CM | POA: Diagnosis not present

## 2017-01-20 DIAGNOSIS — M5417 Radiculopathy, lumbosacral region: Secondary | ICD-10-CM | POA: Diagnosis not present

## 2017-01-20 DIAGNOSIS — M6281 Muscle weakness (generalized): Secondary | ICD-10-CM | POA: Diagnosis not present

## 2017-01-23 DIAGNOSIS — M6281 Muscle weakness (generalized): Secondary | ICD-10-CM | POA: Diagnosis not present

## 2017-01-23 DIAGNOSIS — M5441 Lumbago with sciatica, right side: Secondary | ICD-10-CM | POA: Diagnosis not present

## 2017-01-23 DIAGNOSIS — M545 Low back pain: Secondary | ICD-10-CM | POA: Diagnosis not present

## 2017-01-23 DIAGNOSIS — M5417 Radiculopathy, lumbosacral region: Secondary | ICD-10-CM | POA: Diagnosis not present

## 2017-01-24 DIAGNOSIS — M5417 Radiculopathy, lumbosacral region: Secondary | ICD-10-CM | POA: Diagnosis not present

## 2017-01-24 DIAGNOSIS — M6281 Muscle weakness (generalized): Secondary | ICD-10-CM | POA: Diagnosis not present

## 2017-01-24 DIAGNOSIS — M5441 Lumbago with sciatica, right side: Secondary | ICD-10-CM | POA: Diagnosis not present

## 2017-01-24 DIAGNOSIS — M545 Low back pain: Secondary | ICD-10-CM | POA: Diagnosis not present

## 2017-01-26 DIAGNOSIS — M5441 Lumbago with sciatica, right side: Secondary | ICD-10-CM | POA: Diagnosis not present

## 2017-01-26 DIAGNOSIS — M5417 Radiculopathy, lumbosacral region: Secondary | ICD-10-CM | POA: Diagnosis not present

## 2017-01-26 DIAGNOSIS — M545 Low back pain: Secondary | ICD-10-CM | POA: Diagnosis not present

## 2017-01-26 DIAGNOSIS — M6281 Muscle weakness (generalized): Secondary | ICD-10-CM | POA: Diagnosis not present

## 2017-02-06 DIAGNOSIS — M6281 Muscle weakness (generalized): Secondary | ICD-10-CM | POA: Diagnosis not present

## 2017-02-06 DIAGNOSIS — M5417 Radiculopathy, lumbosacral region: Secondary | ICD-10-CM | POA: Diagnosis not present

## 2017-02-06 DIAGNOSIS — M5441 Lumbago with sciatica, right side: Secondary | ICD-10-CM | POA: Diagnosis not present

## 2017-02-06 DIAGNOSIS — M545 Low back pain: Secondary | ICD-10-CM | POA: Diagnosis not present

## 2017-02-07 DIAGNOSIS — M545 Low back pain: Secondary | ICD-10-CM | POA: Diagnosis not present

## 2017-02-07 DIAGNOSIS — M6281 Muscle weakness (generalized): Secondary | ICD-10-CM | POA: Diagnosis not present

## 2017-02-07 DIAGNOSIS — M5441 Lumbago with sciatica, right side: Secondary | ICD-10-CM | POA: Diagnosis not present

## 2017-02-07 DIAGNOSIS — M5417 Radiculopathy, lumbosacral region: Secondary | ICD-10-CM | POA: Diagnosis not present

## 2017-02-09 DIAGNOSIS — M6281 Muscle weakness (generalized): Secondary | ICD-10-CM | POA: Diagnosis not present

## 2017-02-09 DIAGNOSIS — M5441 Lumbago with sciatica, right side: Secondary | ICD-10-CM | POA: Diagnosis not present

## 2017-02-09 DIAGNOSIS — M545 Low back pain: Secondary | ICD-10-CM | POA: Diagnosis not present

## 2017-02-09 DIAGNOSIS — M5417 Radiculopathy, lumbosacral region: Secondary | ICD-10-CM | POA: Diagnosis not present

## 2017-02-13 DIAGNOSIS — M545 Low back pain: Secondary | ICD-10-CM | POA: Diagnosis not present

## 2017-02-13 DIAGNOSIS — M6281 Muscle weakness (generalized): Secondary | ICD-10-CM | POA: Diagnosis not present

## 2017-02-13 DIAGNOSIS — M5441 Lumbago with sciatica, right side: Secondary | ICD-10-CM | POA: Diagnosis not present

## 2017-02-13 DIAGNOSIS — M5417 Radiculopathy, lumbosacral region: Secondary | ICD-10-CM | POA: Diagnosis not present

## 2017-02-14 DIAGNOSIS — D225 Melanocytic nevi of trunk: Secondary | ICD-10-CM | POA: Diagnosis not present

## 2017-02-14 DIAGNOSIS — C4441 Basal cell carcinoma of skin of scalp and neck: Secondary | ICD-10-CM | POA: Diagnosis not present

## 2017-02-14 DIAGNOSIS — M5417 Radiculopathy, lumbosacral region: Secondary | ICD-10-CM | POA: Diagnosis not present

## 2017-02-14 DIAGNOSIS — D485 Neoplasm of uncertain behavior of skin: Secondary | ICD-10-CM | POA: Diagnosis not present

## 2017-02-14 DIAGNOSIS — X32XXXD Exposure to sunlight, subsequent encounter: Secondary | ICD-10-CM | POA: Diagnosis not present

## 2017-02-14 DIAGNOSIS — Z1283 Encounter for screening for malignant neoplasm of skin: Secondary | ICD-10-CM | POA: Diagnosis not present

## 2017-02-14 DIAGNOSIS — L570 Actinic keratosis: Secondary | ICD-10-CM | POA: Diagnosis not present

## 2017-02-14 DIAGNOSIS — M545 Low back pain: Secondary | ICD-10-CM | POA: Diagnosis not present

## 2017-02-14 DIAGNOSIS — M5441 Lumbago with sciatica, right side: Secondary | ICD-10-CM | POA: Diagnosis not present

## 2017-02-14 DIAGNOSIS — L986 Other infiltrative disorders of the skin and subcutaneous tissue: Secondary | ICD-10-CM | POA: Diagnosis not present

## 2017-02-14 DIAGNOSIS — M6281 Muscle weakness (generalized): Secondary | ICD-10-CM | POA: Diagnosis not present

## 2017-02-16 DIAGNOSIS — M6281 Muscle weakness (generalized): Secondary | ICD-10-CM | POA: Diagnosis not present

## 2017-02-16 DIAGNOSIS — M545 Low back pain: Secondary | ICD-10-CM | POA: Diagnosis not present

## 2017-02-16 DIAGNOSIS — M5441 Lumbago with sciatica, right side: Secondary | ICD-10-CM | POA: Diagnosis not present

## 2017-02-16 DIAGNOSIS — M5417 Radiculopathy, lumbosacral region: Secondary | ICD-10-CM | POA: Diagnosis not present

## 2017-02-20 DIAGNOSIS — M5417 Radiculopathy, lumbosacral region: Secondary | ICD-10-CM | POA: Diagnosis not present

## 2017-02-20 DIAGNOSIS — M5441 Lumbago with sciatica, right side: Secondary | ICD-10-CM | POA: Diagnosis not present

## 2017-02-20 DIAGNOSIS — M6281 Muscle weakness (generalized): Secondary | ICD-10-CM | POA: Diagnosis not present

## 2017-02-20 DIAGNOSIS — M545 Low back pain: Secondary | ICD-10-CM | POA: Diagnosis not present

## 2017-02-22 DIAGNOSIS — M5417 Radiculopathy, lumbosacral region: Secondary | ICD-10-CM | POA: Diagnosis not present

## 2017-02-22 DIAGNOSIS — M6281 Muscle weakness (generalized): Secondary | ICD-10-CM | POA: Diagnosis not present

## 2017-02-22 DIAGNOSIS — M545 Low back pain: Secondary | ICD-10-CM | POA: Diagnosis not present

## 2017-02-22 DIAGNOSIS — M5441 Lumbago with sciatica, right side: Secondary | ICD-10-CM | POA: Diagnosis not present

## 2017-02-23 ENCOUNTER — Other Ambulatory Visit: Payer: Medicare Other

## 2017-02-23 DIAGNOSIS — M5441 Lumbago with sciatica, right side: Secondary | ICD-10-CM | POA: Diagnosis not present

## 2017-02-23 DIAGNOSIS — M545 Low back pain: Secondary | ICD-10-CM | POA: Diagnosis not present

## 2017-02-23 DIAGNOSIS — M5417 Radiculopathy, lumbosacral region: Secondary | ICD-10-CM | POA: Diagnosis not present

## 2017-02-23 DIAGNOSIS — M6281 Muscle weakness (generalized): Secondary | ICD-10-CM | POA: Diagnosis not present

## 2017-02-24 ENCOUNTER — Encounter (INDEPENDENT_AMBULATORY_CARE_PROVIDER_SITE_OTHER): Payer: Medicare Other | Admitting: Ophthalmology

## 2017-02-24 DIAGNOSIS — H43813 Vitreous degeneration, bilateral: Secondary | ICD-10-CM | POA: Diagnosis not present

## 2017-02-24 DIAGNOSIS — H353112 Nonexudative age-related macular degeneration, right eye, intermediate dry stage: Secondary | ICD-10-CM

## 2017-02-24 DIAGNOSIS — H35033 Hypertensive retinopathy, bilateral: Secondary | ICD-10-CM | POA: Diagnosis not present

## 2017-02-24 DIAGNOSIS — H353121 Nonexudative age-related macular degeneration, left eye, early dry stage: Secondary | ICD-10-CM | POA: Diagnosis not present

## 2017-02-24 DIAGNOSIS — I10 Essential (primary) hypertension: Secondary | ICD-10-CM | POA: Diagnosis not present

## 2017-02-27 DIAGNOSIS — M6281 Muscle weakness (generalized): Secondary | ICD-10-CM | POA: Diagnosis not present

## 2017-02-27 DIAGNOSIS — M5417 Radiculopathy, lumbosacral region: Secondary | ICD-10-CM | POA: Diagnosis not present

## 2017-02-27 DIAGNOSIS — M545 Low back pain: Secondary | ICD-10-CM | POA: Diagnosis not present

## 2017-02-27 DIAGNOSIS — M5441 Lumbago with sciatica, right side: Secondary | ICD-10-CM | POA: Diagnosis not present

## 2017-02-28 DIAGNOSIS — D225 Melanocytic nevi of trunk: Secondary | ICD-10-CM | POA: Diagnosis not present

## 2017-02-28 DIAGNOSIS — D485 Neoplasm of uncertain behavior of skin: Secondary | ICD-10-CM | POA: Diagnosis not present

## 2017-02-28 DIAGNOSIS — L988 Other specified disorders of the skin and subcutaneous tissue: Secondary | ICD-10-CM | POA: Diagnosis not present

## 2017-03-01 DIAGNOSIS — M4316 Spondylolisthesis, lumbar region: Secondary | ICD-10-CM | POA: Diagnosis not present

## 2017-03-01 DIAGNOSIS — Z6826 Body mass index (BMI) 26.0-26.9, adult: Secondary | ICD-10-CM | POA: Diagnosis not present

## 2017-03-01 DIAGNOSIS — I10 Essential (primary) hypertension: Secondary | ICD-10-CM | POA: Diagnosis not present

## 2017-03-01 DIAGNOSIS — M5441 Lumbago with sciatica, right side: Secondary | ICD-10-CM | POA: Diagnosis not present

## 2017-03-02 DIAGNOSIS — M6281 Muscle weakness (generalized): Secondary | ICD-10-CM | POA: Diagnosis not present

## 2017-03-02 DIAGNOSIS — M5441 Lumbago with sciatica, right side: Secondary | ICD-10-CM | POA: Diagnosis not present

## 2017-03-02 DIAGNOSIS — M5417 Radiculopathy, lumbosacral region: Secondary | ICD-10-CM | POA: Diagnosis not present

## 2017-03-02 DIAGNOSIS — M545 Low back pain: Secondary | ICD-10-CM | POA: Diagnosis not present

## 2017-03-20 ENCOUNTER — Ambulatory Visit (INDEPENDENT_AMBULATORY_CARE_PROVIDER_SITE_OTHER): Payer: Medicare Other | Admitting: *Deleted

## 2017-03-20 ENCOUNTER — Telehealth: Payer: Self-pay | Admitting: Cardiology

## 2017-03-20 DIAGNOSIS — I495 Sick sinus syndrome: Secondary | ICD-10-CM | POA: Diagnosis not present

## 2017-03-20 NOTE — Telephone Encounter (Signed)
LMOVM reminding pt to send remote transmission.   

## 2017-03-21 NOTE — Progress Notes (Signed)
Remote pacemaker transmission.   

## 2017-03-22 LAB — CUP PACEART REMOTE DEVICE CHECK
Battery Remaining Percentage: 91 %
Brady Statistic RV Percent Paced: 69 %
Implantable Lead Implant Date: 20130801
Implantable Lead Location: 753859
Implantable Lead Location: 753860
Implantable Pulse Generator Implant Date: 20130801
Lead Channel Impedance Value: 650 Ohm
Lead Channel Pacing Threshold Amplitude: 0.5 V
Lead Channel Pacing Threshold Pulse Width: 0.4 ms
Lead Channel Setting Pacing Amplitude: 2.5 V
MDC IDC LEAD IMPLANT DT: 20130801
MDC IDC MSMT BATTERY REMAINING LONGEVITY: 122 mo
MDC IDC MSMT BATTERY VOLTAGE: 2.95 V
MDC IDC MSMT LEADCHNL RV SENSING INTR AMPL: 12 mV
MDC IDC SESS DTM: 20180709185503
MDC IDC SET LEADCHNL RV PACING PULSEWIDTH: 0.4 ms
MDC IDC SET LEADCHNL RV SENSING SENSITIVITY: 2 mV
Pulse Gen Model: 2210
Pulse Gen Serial Number: 7377947

## 2017-03-24 ENCOUNTER — Encounter: Payer: Self-pay | Admitting: Cardiology

## 2017-03-25 ENCOUNTER — Other Ambulatory Visit: Payer: Self-pay | Admitting: Internal Medicine

## 2017-03-27 NOTE — Telephone Encounter (Signed)
Please review for refill, looks like patient is following Dr. [name] for care.

## 2017-04-11 ENCOUNTER — Encounter: Payer: Self-pay | Admitting: Internal Medicine

## 2017-04-11 DIAGNOSIS — M1009 Idiopathic gout, multiple sites: Secondary | ICD-10-CM | POA: Diagnosis not present

## 2017-04-11 DIAGNOSIS — Z7901 Long term (current) use of anticoagulants: Secondary | ICD-10-CM | POA: Diagnosis not present

## 2017-04-11 DIAGNOSIS — I10 Essential (primary) hypertension: Secondary | ICD-10-CM | POA: Diagnosis not present

## 2017-04-11 DIAGNOSIS — E1142 Type 2 diabetes mellitus with diabetic polyneuropathy: Secondary | ICD-10-CM | POA: Diagnosis not present

## 2017-04-11 DIAGNOSIS — E784 Other hyperlipidemia: Secondary | ICD-10-CM | POA: Diagnosis not present

## 2017-04-11 DIAGNOSIS — K210 Gastro-esophageal reflux disease with esophagitis: Secondary | ICD-10-CM | POA: Diagnosis not present

## 2017-04-21 ENCOUNTER — Ambulatory Visit (INDEPENDENT_AMBULATORY_CARE_PROVIDER_SITE_OTHER): Payer: Medicare Other | Admitting: Internal Medicine

## 2017-04-21 ENCOUNTER — Encounter: Payer: Self-pay | Admitting: Internal Medicine

## 2017-04-21 ENCOUNTER — Encounter: Payer: Self-pay | Admitting: *Deleted

## 2017-04-21 VITALS — BP 138/79 | HR 60 | Ht 73.0 in | Wt 199.0 lb

## 2017-04-21 DIAGNOSIS — I119 Hypertensive heart disease without heart failure: Secondary | ICD-10-CM

## 2017-04-21 DIAGNOSIS — I482 Chronic atrial fibrillation, unspecified: Secondary | ICD-10-CM

## 2017-04-21 DIAGNOSIS — I495 Sick sinus syndrome: Secondary | ICD-10-CM

## 2017-04-21 LAB — CUP PACEART INCLINIC DEVICE CHECK
Date Time Interrogation Session: 20180810095321
Implantable Lead Implant Date: 20130801
Implantable Lead Location: 753859
Implantable Pulse Generator Implant Date: 20130801
Lead Channel Pacing Threshold Amplitude: 0.5 V
Lead Channel Pacing Threshold Pulse Width: 0.4 ms
Lead Channel Sensing Intrinsic Amplitude: 12 mV
Lead Channel Setting Pacing Amplitude: 2.5 V
Lead Channel Setting Sensing Sensitivity: 2 mV
MDC IDC LEAD IMPLANT DT: 20130801
MDC IDC LEAD LOCATION: 753860
MDC IDC MSMT BATTERY REMAINING LONGEVITY: 118 mo
MDC IDC MSMT BATTERY VOLTAGE: 2.95 V
MDC IDC MSMT LEADCHNL RA SENSING INTR AMPL: 3.6 mV
MDC IDC MSMT LEADCHNL RV IMPEDANCE VALUE: 687.5 Ohm
MDC IDC MSMT LEADCHNL RV PACING THRESHOLD AMPLITUDE: 0.5 V
MDC IDC MSMT LEADCHNL RV PACING THRESHOLD PULSEWIDTH: 0.4 ms
MDC IDC SET LEADCHNL RV PACING PULSEWIDTH: 0.4 ms
MDC IDC STAT BRADY RA PERCENT PACED: 0 %
MDC IDC STAT BRADY RV PERCENT PACED: 70 %
Pulse Gen Model: 2210
Pulse Gen Serial Number: 7377947

## 2017-04-21 NOTE — Progress Notes (Signed)
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  Since last being seen in our clinic, the patient reports doing very well.  Today, he denies symptoms of palpitations, chest pain, shortness of breath,  lower extremity edema, dizziness, presyncope, or syncope.  The patient is otherwise without complaint today.   Past Medical History:  Diagnosis Date  . Asthma    "when I was a child"  . Atrial fibrillation ([place])    long standing persistent  . GERD (gastroesophageal reflux disease)   . Gouty arthritis   . H/O hiatal hernia   . High cholesterol   . History of blood transfusion ~ 1984  . History of bronchitis    "used to get it twice/year; last time was ~ 2 yr ago" (04/12/2012)  . HTN (hypertension)   . Kidney cysts    "never treated it"  . Peripheral vascular disease ([place])   . Pulmonary nodule   . 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 Type 2 diabetes mellitus ([place])    Past Surgical History:  Procedure Laterality Date  . CARDIAC CATHETERIZATION N/A 06/23/2016   Procedure: Left Heart Cath and Coronary Angiography;  Surgeon: Nelva Bush, MD;  Location: [place] CV LAB;  Service: Cardiovascular;  Laterality: N/A;  . ESOPHAGEAL DILATION  01/2012  . LACERATION REPAIR  ~ 1984   left forearm; "cut it w/a power saw"  . MELANOMA EXCISION  ~ 2011   left posterior shoulder  . PACEMAKER INSERTION  04/12/2012   SJM Accent DR RF pacemaker implanted by Dr [name] for tachy/brady syndrome  . PERMANENT PACEMAKER INSERTION N/A 04/12/2012   Procedure: PERMANENT PACEMAKER INSERTION;  Surgeon: Thompson Grayer, MD;  Location: [facility] CATH LAB;  Service: Cardiovascular;  Laterality: N/A;    ROS- all systems are reviewed and negative except as per HPI above  Current Outpatient Prescriptions  Medication Sig Dispense Refill  . allopurinol (ZYLOPRIM) 300 MG  tablet Take 300 mg by mouth daily.    . celecoxib (CELEBREX) 200 MG capsule     . cholecalciferol (VITAMIN D) 1000 units tablet Take 1,000 Units by mouth every evening.    . Cinnamon 500 MG TABS Take 1,000 mg by mouth daily.     . clonazePAM (KLONOPIN) 1 MG tablet Take 1 mg by mouth at bedtime.    Marland Kitchen diltiazem (DILACOR XR) 180 MG 24 hr capsule TAKE TWO CAPSULES EACH DAY (Patient taking differently: TAKE TWO CAPSULES (360 MG) DAILY) 180 capsule 1  . glipiZIDE (GLUCOTROL XL) 2.5 MG 24 hr tablet Take 2.5 mg by mouth daily.    Marland Kitchen lisinopril-hydrochlorothiazide (PRINZIDE,ZESTORETIC) 20-12.5 MG tablet Take 1 tablet by mouth daily.    . metFORMIN (GLUCOPHAGE) 500 MG tablet Take 1 tablet (500 mg total) by mouth daily with breakfast.    . Omega-3 Fatty Acids (FISH OIL) 1000 MG CAPS Take 1,000 mg by mouth 2 (two) times daily.     . pantoprazole (PROTONIX) 40 MG tablet Take 40 mg by mouth daily.    Vladimir Faster Glycol-Propyl Glycol (SYSTANE ULTRA) 0.4-0.3 % SOLN Apply 1 drop to eye 4 (four) times daily.    . rosuvastatin (CRESTOR) 5 MG tablet Take 1 tablet (5 mg total) by mouth every Monday, Wednesday, and Friday. 30 tablet 5  . warfarin (COUMADIN) 5 MG tablet Take 5 mg by mouth at bedtime.  As Directed.    . Zinc 50 MG TABS Take 1 tablet by mouth daily.     No current facility-administered medications for this visit.     Physical Exam: Vitals:   04/21/17 0848  BP: 138/79  Pulse: 60  SpO2: 98%  Weight: 199 lb (90.3 kg)  Height: 6\' 1"  (1.854 m)    GEN- The patient is well appearing, alert and oriented x 3 today.   Head- normocephalic, atraumatic Eyes-  Sclera clear, conjunctiva pink Ears- hearing intact Oropharynx- clear Lungs- Clear to ausculation bilaterally, normal work of breathing Chest- pacemaker pocket is well healed Heart- Regular rate and rhythm, no murmurs, rubs or gallops, PMI not laterally displaced GI- soft, NT, ND, + BS Extremities- no clubbing, cyanosis, or edema  Pacemaker  interrogation- reviewed in detail today,  See PACEART report   Assessment and Plan:  1. Symptomatic bradycardia  Normal pacemaker function See Pace Art report No changes today  2. Longstanding persistent afib Doing well with rate control On coumadin  3. Hypertensive cardiovascular disease Stable No change required today  4. CAD Moderate CAD Medically managed  Merlin Return in a year  Thompson Grayer MD, [facility] 04/21/2017 9:13 AM

## 2017-04-21 NOTE — Patient Instructions (Signed)
Medication Instructions:  Continue all current medications.  Labwork: none  Testing/Procedures: none  Follow-Up: Your physician wants you to follow up in:  1 year.  You will receive a reminder letter in the mail one-two months in advance.  If you don't receive a letter, please call our office to schedule the follow up appointment   Any Other Special Instructions Will Be Listed Below (If Applicable). Remote monitoring is used to monitor your Pacemaker of ICD from home. This monitoring reduces the number of office visits required to check your device to one time per year. It allows [place] to keep an eye on the functioning of your device to ensure it is working properly. You are scheduled for a device check from home on 06-19-17. You may send your transmission at any time that day. If you have a wireless device, the transmission will be sent automatically. After your physician reviews your transmission, you will receive a postcard with your next transmission date.  If you need a refill on your cardiac medications before your next appointment, please call your pharmacy.

## 2017-05-19 ENCOUNTER — Encounter: Payer: BLUE CROSS/BLUE SHIELD | Admitting: Internal Medicine

## 2017-06-18 ENCOUNTER — Other Ambulatory Visit: Payer: Self-pay | Admitting: Internal Medicine

## 2017-06-19 ENCOUNTER — Ambulatory Visit (INDEPENDENT_AMBULATORY_CARE_PROVIDER_SITE_OTHER): Payer: Medicare Other | Admitting: *Deleted

## 2017-06-19 DIAGNOSIS — I495 Sick sinus syndrome: Secondary | ICD-10-CM

## 2017-06-19 NOTE — Telephone Encounter (Signed)
Please review for refill. Thanks!  

## 2017-06-20 NOTE — Progress Notes (Signed)
Remote pacemaker transmission.   

## 2017-06-22 ENCOUNTER — Encounter: Payer: Self-pay | Admitting: Cardiology

## 2017-07-05 DIAGNOSIS — Z23 Encounter for immunization: Secondary | ICD-10-CM | POA: Diagnosis not present

## 2017-07-10 LAB — CUP PACEART REMOTE DEVICE CHECK
Battery Remaining Longevity: 121 mo
Date Time Interrogation Session: 20181008061933
Implantable Lead Implant Date: 20130801
Implantable Lead Location: 753859
Implantable Lead Location: 753860
Implantable Lead Model: 1948
Lead Channel Pacing Threshold Amplitude: 0.5 V
Lead Channel Pacing Threshold Pulse Width: 0.4 ms
Lead Channel Sensing Intrinsic Amplitude: 10.4 mV
Lead Channel Setting Pacing Amplitude: 2.5 V
Lead Channel Setting Sensing Sensitivity: 2 mV
MDC IDC LEAD IMPLANT DT: 20130801
MDC IDC MSMT BATTERY REMAINING PERCENTAGE: 91 %
MDC IDC MSMT BATTERY VOLTAGE: 2.95 V
MDC IDC MSMT LEADCHNL RV IMPEDANCE VALUE: 660 Ohm
MDC IDC PG IMPLANT DT: 20130801
MDC IDC PG SERIAL: 7377947
MDC IDC SET LEADCHNL RV PACING PULSEWIDTH: 0.4 ms
MDC IDC STAT BRADY RV PERCENT PACED: 84 %

## 2017-07-12 DIAGNOSIS — Z7901 Long term (current) use of anticoagulants: Secondary | ICD-10-CM | POA: Diagnosis not present

## 2017-07-12 DIAGNOSIS — M1009 Idiopathic gout, multiple sites: Secondary | ICD-10-CM | POA: Diagnosis not present

## 2017-07-12 DIAGNOSIS — I10 Essential (primary) hypertension: Secondary | ICD-10-CM | POA: Diagnosis not present

## 2017-07-12 DIAGNOSIS — K210 Gastro-esophageal reflux disease with esophagitis: Secondary | ICD-10-CM | POA: Diagnosis not present

## 2017-07-12 DIAGNOSIS — E7849 Other hyperlipidemia: Secondary | ICD-10-CM | POA: Diagnosis not present

## 2017-07-12 DIAGNOSIS — E1142 Type 2 diabetes mellitus with diabetic polyneuropathy: Secondary | ICD-10-CM | POA: Diagnosis not present

## 2017-08-14 DIAGNOSIS — Z7901 Long term (current) use of anticoagulants: Secondary | ICD-10-CM | POA: Diagnosis not present

## 2017-09-15 DIAGNOSIS — Z7901 Long term (current) use of anticoagulants: Secondary | ICD-10-CM | POA: Diagnosis not present

## 2017-09-18 ENCOUNTER — Ambulatory Visit (INDEPENDENT_AMBULATORY_CARE_PROVIDER_SITE_OTHER): Payer: Medicare Other | Admitting: *Deleted

## 2017-09-18 DIAGNOSIS — I495 Sick sinus syndrome: Secondary | ICD-10-CM

## 2017-09-18 NOTE — Progress Notes (Signed)
Remote pacemaker transmission.   

## 2017-09-19 ENCOUNTER — Encounter: Payer: Self-pay | Admitting: Cardiology

## 2017-09-23 LAB — CUP PACEART REMOTE DEVICE CHECK
Battery Remaining Percentage: 91 %
Brady Statistic RV Percent Paced: 84 %
Date Time Interrogation Session: 20190107104911
Implantable Lead Implant Date: 20130801
Implantable Lead Location: 753860
Implantable Pulse Generator Implant Date: 20130801
Lead Channel Impedance Value: 700 Ohm
Lead Channel Pacing Threshold Amplitude: 0.5 V
Lead Channel Sensing Intrinsic Amplitude: 12 mV
Lead Channel Setting Pacing Amplitude: 2.5 V
MDC IDC LEAD IMPLANT DT: 20130801
MDC IDC LEAD LOCATION: 753859
MDC IDC MSMT BATTERY REMAINING LONGEVITY: 122 mo
MDC IDC MSMT BATTERY VOLTAGE: 2.95 V
MDC IDC MSMT LEADCHNL RV PACING THRESHOLD PULSEWIDTH: 0.4 ms
MDC IDC PG SERIAL: 7377947
MDC IDC SET LEADCHNL RV PACING PULSEWIDTH: 0.4 ms
MDC IDC SET LEADCHNL RV SENSING SENSITIVITY: 2 mV

## 2017-10-19 DIAGNOSIS — I10 Essential (primary) hypertension: Secondary | ICD-10-CM | POA: Diagnosis not present

## 2017-10-19 DIAGNOSIS — E1142 Type 2 diabetes mellitus with diabetic polyneuropathy: Secondary | ICD-10-CM | POA: Diagnosis not present

## 2017-10-19 DIAGNOSIS — K210 Gastro-esophageal reflux disease with esophagitis: Secondary | ICD-10-CM | POA: Diagnosis not present

## 2017-10-19 DIAGNOSIS — M1009 Idiopathic gout, multiple sites: Secondary | ICD-10-CM | POA: Diagnosis not present

## 2017-10-19 DIAGNOSIS — E7849 Other hyperlipidemia: Secondary | ICD-10-CM | POA: Diagnosis not present

## 2017-10-19 DIAGNOSIS — Z7901 Long term (current) use of anticoagulants: Secondary | ICD-10-CM | POA: Diagnosis not present

## 2017-11-13 DIAGNOSIS — Z7901 Long term (current) use of anticoagulants: Secondary | ICD-10-CM | POA: Diagnosis not present

## 2017-12-18 ENCOUNTER — Ambulatory Visit (INDEPENDENT_AMBULATORY_CARE_PROVIDER_SITE_OTHER): Payer: Medicare Other | Admitting: *Deleted

## 2017-12-18 DIAGNOSIS — I495 Sick sinus syndrome: Secondary | ICD-10-CM

## 2017-12-18 NOTE — Progress Notes (Signed)
Remote pacemaker transmission.   

## 2017-12-20 ENCOUNTER — Encounter: Payer: Self-pay | Admitting: Cardiology

## 2018-01-08 DIAGNOSIS — Z85828 Personal history of other malignant neoplasm of skin: Secondary | ICD-10-CM | POA: Diagnosis not present

## 2018-01-08 DIAGNOSIS — X32XXXD Exposure to sunlight, subsequent encounter: Secondary | ICD-10-CM | POA: Diagnosis not present

## 2018-01-08 DIAGNOSIS — L570 Actinic keratosis: Secondary | ICD-10-CM | POA: Diagnosis not present

## 2018-01-08 DIAGNOSIS — Z08 Encounter for follow-up examination after completed treatment for malignant neoplasm: Secondary | ICD-10-CM | POA: Diagnosis not present

## 2018-01-08 DIAGNOSIS — D225 Melanocytic nevi of trunk: Secondary | ICD-10-CM | POA: Diagnosis not present

## 2018-01-08 DIAGNOSIS — Z1283 Encounter for screening for malignant neoplasm of skin: Secondary | ICD-10-CM | POA: Diagnosis not present

## 2018-01-10 LAB — CUP PACEART REMOTE DEVICE CHECK
Battery Voltage: 2.95 V
Implantable Lead Implant Date: 20130801
Implantable Lead Implant Date: 20130801
Implantable Lead Location: 753859
Implantable Pulse Generator Implant Date: 20130801
Lead Channel Impedance Value: 640 Ohm
Lead Channel Pacing Threshold Pulse Width: 0.4 ms
Lead Channel Sensing Intrinsic Amplitude: 12 mV
Lead Channel Setting Sensing Sensitivity: 2 mV
MDC IDC LEAD LOCATION: 753860
MDC IDC MSMT BATTERY REMAINING LONGEVITY: 121 mo
MDC IDC MSMT BATTERY REMAINING PERCENTAGE: 91 %
MDC IDC MSMT LEADCHNL RV PACING THRESHOLD AMPLITUDE: 0.5 V
MDC IDC SESS DTM: 20190408060008
MDC IDC SET LEADCHNL RV PACING AMPLITUDE: 2.5 V
MDC IDC SET LEADCHNL RV PACING PULSEWIDTH: 0.4 ms
MDC IDC STAT BRADY RV PERCENT PACED: 81 %
Pulse Gen Serial Number: 7377947

## 2018-01-23 DIAGNOSIS — Z7901 Long term (current) use of anticoagulants: Secondary | ICD-10-CM | POA: Diagnosis not present

## 2018-01-23 DIAGNOSIS — K210 Gastro-esophageal reflux disease with esophagitis: Secondary | ICD-10-CM | POA: Diagnosis not present

## 2018-01-23 DIAGNOSIS — I10 Essential (primary) hypertension: Secondary | ICD-10-CM | POA: Diagnosis not present

## 2018-01-23 DIAGNOSIS — E1142 Type 2 diabetes mellitus with diabetic polyneuropathy: Secondary | ICD-10-CM | POA: Diagnosis not present

## 2018-01-23 DIAGNOSIS — E7849 Other hyperlipidemia: Secondary | ICD-10-CM | POA: Diagnosis not present

## 2018-01-23 DIAGNOSIS — M1009 Idiopathic gout, multiple sites: Secondary | ICD-10-CM | POA: Diagnosis not present

## 2018-03-19 ENCOUNTER — Ambulatory Visit (INDEPENDENT_AMBULATORY_CARE_PROVIDER_SITE_OTHER): Payer: Medicare Other | Admitting: *Deleted

## 2018-03-19 DIAGNOSIS — I495 Sick sinus syndrome: Secondary | ICD-10-CM | POA: Diagnosis not present

## 2018-03-19 DIAGNOSIS — I482 Chronic atrial fibrillation, unspecified: Secondary | ICD-10-CM

## 2018-03-19 NOTE — Progress Notes (Signed)
Remote pacemaker transmission.   

## 2018-03-20 DIAGNOSIS — K210 Gastro-esophageal reflux disease with esophagitis: Secondary | ICD-10-CM | POA: Diagnosis not present

## 2018-03-20 DIAGNOSIS — E1142 Type 2 diabetes mellitus with diabetic polyneuropathy: Secondary | ICD-10-CM | POA: Diagnosis not present

## 2018-03-20 DIAGNOSIS — I10 Essential (primary) hypertension: Secondary | ICD-10-CM | POA: Diagnosis not present

## 2018-03-20 DIAGNOSIS — Z7901 Long term (current) use of anticoagulants: Secondary | ICD-10-CM | POA: Diagnosis not present

## 2018-03-20 DIAGNOSIS — E7849 Other hyperlipidemia: Secondary | ICD-10-CM | POA: Diagnosis not present

## 2018-03-20 DIAGNOSIS — M1009 Idiopathic gout, multiple sites: Secondary | ICD-10-CM | POA: Diagnosis not present

## 2018-03-25 LAB — CUP PACEART REMOTE DEVICE CHECK
Battery Remaining Longevity: 107 mo
Battery Remaining Percentage: 81 %
Battery Voltage: 2.93 V
Brady Statistic RV Percent Paced: 83 %
Date Time Interrogation Session: 20190708075425
Implantable Lead Implant Date: 20130801
Implantable Lead Location: 753860
Lead Channel Impedance Value: 630 Ohm
Lead Channel Pacing Threshold Amplitude: 0.5 V
Lead Channel Setting Pacing Amplitude: 2.5 V
Lead Channel Setting Pacing Pulse Width: 0.4 ms
MDC IDC LEAD IMPLANT DT: 20130801
MDC IDC LEAD LOCATION: 753859
MDC IDC MSMT LEADCHNL RV PACING THRESHOLD PULSEWIDTH: 0.4 ms
MDC IDC MSMT LEADCHNL RV SENSING INTR AMPL: 12 mV
MDC IDC PG IMPLANT DT: 20130801
MDC IDC PG SERIAL: 7377947
MDC IDC SET LEADCHNL RV SENSING SENSITIVITY: 2 mV
Pulse Gen Model: 2210

## 2018-04-13 ENCOUNTER — Encounter: Payer: Self-pay | Admitting: Internal Medicine

## 2018-04-13 ENCOUNTER — Ambulatory Visit (INDEPENDENT_AMBULATORY_CARE_PROVIDER_SITE_OTHER): Payer: Medicare Other | Admitting: Internal Medicine

## 2018-04-13 VITALS — BP 129/70 | HR 64 | Ht 73.0 in | Wt 198.6 lb

## 2018-04-13 DIAGNOSIS — I495 Sick sinus syndrome: Secondary | ICD-10-CM | POA: Diagnosis not present

## 2018-04-13 DIAGNOSIS — I482 Chronic atrial fibrillation, unspecified: Secondary | ICD-10-CM

## 2018-04-13 DIAGNOSIS — Z950 Presence of cardiac pacemaker: Secondary | ICD-10-CM | POA: Diagnosis not present

## 2018-04-13 NOTE — Progress Notes (Signed)
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  Since last being seen in our clinic, the patient reports doing very well.  Today, he denies symptoms of palpitations, chest pain, shortness of breath,  lower extremity edema, dizziness, presyncope, or syncope.  The patient is otherwise without complaint today.   Past Medical History:  Diagnosis Date  . Asthma    "when I was a child"  . Atrial fibrillation ([place])    long standing persistent  . GERD (gastroesophageal reflux disease)   . Gouty arthritis   . H/O hiatal hernia   . High cholesterol   . History of blood transfusion ~ 1984  . History of bronchitis    "used to get it twice/year; last time was ~ 2 yr ago" (04/12/2012)  . HTN (hypertension)   . Kidney cysts    "never treated it"  . Peripheral vascular disease ([place])   . Pulmonary nodule   . 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 Type 2 diabetes mellitus ([place])    Past Surgical History:  Procedure Laterality Date  . CARDIAC CATHETERIZATION N/A 06/23/2016   Procedure: Left Heart Cath and Coronary Angiography;  Surgeon: Nelva Bush, MD;  Location: [place] CV LAB;  Service: Cardiovascular;  Laterality: N/A;  . ESOPHAGEAL DILATION  01/2012  . LACERATION REPAIR  ~ 1984   left forearm; "cut it w/a power saw"  . MELANOMA EXCISION  ~ 2011   left posterior shoulder  . PACEMAKER INSERTION  04/12/2012   SJM Accent DR RF pacemaker implanted by Dr [name] for tachy/brady syndrome  . PERMANENT PACEMAKER INSERTION N/A 04/12/2012   Procedure: PERMANENT PACEMAKER INSERTION;  Surgeon: Thompson Grayer, MD;  Location[facility] CATH LAB;  Service: Cardiovascular;  Laterality: N/A;    ROS- all systems are reviewed and negative except as per HPI above  Current Outpatient Medications  Medication Sig Dispense Refill  . allopurinol (ZYLOPRIM) 300 MG tablet Take  300 mg by mouth daily.    . Calcium Carb-Cholecalciferol (CALCIUM 500 + D) 500-200 MG-UNIT TABS Take 1 tablet by mouth daily.    . celecoxib (CELEBREX) 200 MG capsule Take 200 mg by mouth daily.     . cholecalciferol (VITAMIN D) 1000 units tablet Take 1,000 Units by mouth every evening.    . Cinnamon 500 MG TABS Take 1,500 mg by mouth daily.     . clonazePAM (KLONOPIN) 1 MG tablet Take 1 mg by mouth at bedtime.    Marland Kitchen diltiazem (DILACOR XR) 180 MG 24 hr capsule TAKE TWO CAPSULES EACH DAY (Patient taking differently: TAKE TWO CAPSULES (360 MG) DAILY) 180 capsule 1  . glipiZIDE (GLUCOTROL XL) 2.5 MG 24 hr tablet Take 2.5 mg by mouth daily.    Marland Kitchen lisinopril-hydrochlorothiazide (PRINZIDE,ZESTORETIC) 20-12.5 MG tablet Take 1 tablet by mouth daily.    . metFORMIN (GLUCOPHAGE) 500 MG tablet Take 1 tablet (500 mg total) by mouth daily with breakfast.    . Multiple Vitamins-Minerals (ICAPS) TABS Take 1 tablet by mouth 2 (two) times daily.    . Omega-3 Fatty Acids (FISH OIL) 1000 MG CAPS Take 1,000 mg by mouth 2 (two) times daily.     . pantoprazole (PROTONIX) 40 MG tablet Take 40 mg by mouth daily.    Vladimir Faster Glycol-Propyl Glycol (SYSTANE ULTRA) 0.4-0.3 % SOLN Apply 1 drop to eye  4 (four) times daily.    . rosuvastatin (CRESTOR) 5 MG tablet TAKE 1 TABLET EVERY MONDAY, WEDNESDAY, AND FRIDAY 38 tablet 2  . warfarin (COUMADIN) 5 MG tablet Take 5 mg by mouth at bedtime. As Directed.    . Zinc 50 MG TABS Take 1 tablet by mouth daily.     No current facility-administered medications for this visit.     Physical Exam: Vitals:   04/13/18 0829  BP: 129/70  Pulse: 64  SpO2: 98%  Weight: 198 lb 9.6 oz (90.1 kg)  Height: 6\' 1"  (1.854 m)    GEN- The patient is well appearing, alert and oriented x 3 today.   Head- normocephalic, atraumatic Eyes-  Sclera clear, conjunctiva pink Ears- hearing intact Oropharynx- clear Lungs- Clear to ausculation bilaterally, normal work of breathing Chest- pacemaker  pocket is well healed Heart- Regular rate and rhythm, no murmurs, rubs or gallops, PMI not laterally displaced GI- soft, NT, ND, + BS Extremities- no clubbing, cyanosis, or edema  Pacemaker interrogation- reviewed in detail today,  See PACEART report   Assessment and Plan:  1. Symptomatic sinus bradycardia  Normal pacemaker function See Pace Art report No changes today  2. Longstanding persistent afib Well controlled with rate control On coumadin  3. HTN Stable No change required today  4. CAD No ischemic symptoms No changes  Merlin Return in a year  Thompson Grayer MD, [facility] 04/13/2018 8:37 AM

## 2018-04-13 NOTE — Patient Instructions (Addendum)
Medication Instructions:  Continue all current medications.  Labwork: none  Testing/Procedures: none  Follow-Up: 1 year   Any Other Special Instructions Will Be Listed Below (If Applicable). Remote monitoring is used to monitor your Pacemaker of ICD from home. This monitoring reduces the number of office visits required to check your device to one time per year. It allows [place] to keep an eye on the functioning of your device to ensure it is working properly. You are scheduled for a device check from home on 06/18/2018. You may send your transmission at any time that day. If you have a wireless device, the transmission will be sent automatically. After your physician reviews your transmission, you will receive a postcard with your next transmission date.  If you need a refill on your cardiac medications before your next appointment, please call your pharmacy.

## 2018-04-30 LAB — CUP PACEART INCLINIC DEVICE CHECK
Battery Voltage: 2.93 V
Brady Statistic RA Percent Paced: 0 %
Implantable Lead Implant Date: 20130801
Implantable Lead Implant Date: 20130801
Implantable Pulse Generator Implant Date: 20130801
Lead Channel Pacing Threshold Amplitude: 0.75 V
Lead Channel Pacing Threshold Pulse Width: 0.4 ms
Lead Channel Sensing Intrinsic Amplitude: 3.6 mV
Lead Channel Setting Pacing Pulse Width: 0.4 ms
Lead Channel Setting Sensing Sensitivity: 2 mV
MDC IDC LEAD LOCATION: 753859
MDC IDC LEAD LOCATION: 753860
MDC IDC MSMT BATTERY REMAINING LONGEVITY: 104 mo
MDC IDC MSMT LEADCHNL RV IMPEDANCE VALUE: 675 Ohm
MDC IDC MSMT LEADCHNL RV SENSING INTR AMPL: 12 mV
MDC IDC SESS DTM: 20190802123250
MDC IDC SET LEADCHNL RV PACING AMPLITUDE: 2.5 V
MDC IDC STAT BRADY RV PERCENT PACED: 84 %
Pulse Gen Serial Number: 7377947

## 2018-05-08 DIAGNOSIS — Z7901 Long term (current) use of anticoagulants: Secondary | ICD-10-CM | POA: Diagnosis not present

## 2018-05-08 DIAGNOSIS — K210 Gastro-esophageal reflux disease with esophagitis: Secondary | ICD-10-CM | POA: Diagnosis not present

## 2018-05-08 DIAGNOSIS — E7849 Other hyperlipidemia: Secondary | ICD-10-CM | POA: Diagnosis not present

## 2018-05-08 DIAGNOSIS — M1009 Idiopathic gout, multiple sites: Secondary | ICD-10-CM | POA: Diagnosis not present

## 2018-05-08 DIAGNOSIS — E1142 Type 2 diabetes mellitus with diabetic polyneuropathy: Secondary | ICD-10-CM | POA: Diagnosis not present

## 2018-05-08 DIAGNOSIS — I10 Essential (primary) hypertension: Secondary | ICD-10-CM | POA: Diagnosis not present

## 2018-05-08 DIAGNOSIS — Z0000 Encounter for general adult medical examination without abnormal findings: Secondary | ICD-10-CM | POA: Diagnosis not present

## 2018-05-08 DIAGNOSIS — Z125 Encounter for screening for malignant neoplasm of prostate: Secondary | ICD-10-CM | POA: Diagnosis not present

## 2018-05-28 ENCOUNTER — Other Ambulatory Visit: Payer: Self-pay | Admitting: Internal Medicine

## 2018-05-28 NOTE — Telephone Encounter (Signed)
Please review for refill.  

## 2018-06-07 DIAGNOSIS — H40033 Anatomical narrow angle, bilateral: Secondary | ICD-10-CM | POA: Diagnosis not present

## 2018-06-07 DIAGNOSIS — H04123 Dry eye syndrome of bilateral lacrimal glands: Secondary | ICD-10-CM | POA: Diagnosis not present

## 2018-06-18 ENCOUNTER — Ambulatory Visit (INDEPENDENT_AMBULATORY_CARE_PROVIDER_SITE_OTHER): Payer: Medicare Other | Admitting: *Deleted

## 2018-06-18 DIAGNOSIS — I495 Sick sinus syndrome: Secondary | ICD-10-CM | POA: Diagnosis not present

## 2018-06-18 NOTE — Progress Notes (Signed)
Remote pacemaker transmission.   

## 2018-07-05 DIAGNOSIS — Z23 Encounter for immunization: Secondary | ICD-10-CM | POA: Diagnosis not present

## 2018-07-09 DIAGNOSIS — X32XXXD Exposure to sunlight, subsequent encounter: Secondary | ICD-10-CM | POA: Diagnosis not present

## 2018-07-09 DIAGNOSIS — Z08 Encounter for follow-up examination after completed treatment for malignant neoplasm: Secondary | ICD-10-CM | POA: Diagnosis not present

## 2018-07-09 DIAGNOSIS — Z85828 Personal history of other malignant neoplasm of skin: Secondary | ICD-10-CM | POA: Diagnosis not present

## 2018-07-09 DIAGNOSIS — L570 Actinic keratosis: Secondary | ICD-10-CM | POA: Diagnosis not present

## 2018-07-17 DIAGNOSIS — Z7901 Long term (current) use of anticoagulants: Secondary | ICD-10-CM | POA: Diagnosis not present

## 2018-08-03 LAB — CUP PACEART REMOTE DEVICE CHECK
Battery Remaining Longevity: 109 mo
Battery Voltage: 2.93 V
Date Time Interrogation Session: 20191007071217
Implantable Lead Implant Date: 20130801
Implantable Lead Location: 753859
Implantable Lead Location: 753860
Implantable Lead Model: 1948
Implantable Pulse Generator Implant Date: 20130801
Lead Channel Pacing Threshold Amplitude: 0.75 V
Lead Channel Pacing Threshold Pulse Width: 0.4 ms
Lead Channel Setting Pacing Amplitude: 2.5 V
MDC IDC LEAD IMPLANT DT: 20130801
MDC IDC MSMT BATTERY REMAINING PERCENTAGE: 81 %
MDC IDC MSMT LEADCHNL RV IMPEDANCE VALUE: 710 Ohm
MDC IDC MSMT LEADCHNL RV SENSING INTR AMPL: 12 mV
MDC IDC SET LEADCHNL RV PACING PULSEWIDTH: 0.4 ms
MDC IDC SET LEADCHNL RV SENSING SENSITIVITY: 2 mV
MDC IDC STAT BRADY RV PERCENT PACED: 92 %
Pulse Gen Serial Number: 7377947

## 2018-08-17 DIAGNOSIS — Z7901 Long term (current) use of anticoagulants: Secondary | ICD-10-CM | POA: Diagnosis not present

## 2018-08-17 DIAGNOSIS — K210 Gastro-esophageal reflux disease with esophagitis: Secondary | ICD-10-CM | POA: Diagnosis not present

## 2018-08-17 DIAGNOSIS — E1142 Type 2 diabetes mellitus with diabetic polyneuropathy: Secondary | ICD-10-CM | POA: Diagnosis not present

## 2018-08-17 DIAGNOSIS — I10 Essential (primary) hypertension: Secondary | ICD-10-CM | POA: Diagnosis not present

## 2018-08-17 DIAGNOSIS — M1009 Idiopathic gout, multiple sites: Secondary | ICD-10-CM | POA: Diagnosis not present

## 2018-08-17 DIAGNOSIS — E7849 Other hyperlipidemia: Secondary | ICD-10-CM | POA: Diagnosis not present

## 2018-08-21 ENCOUNTER — Encounter (INDEPENDENT_AMBULATORY_CARE_PROVIDER_SITE_OTHER): Payer: Self-pay | Admitting: *Deleted

## 2018-08-27 ENCOUNTER — Encounter (INDEPENDENT_AMBULATORY_CARE_PROVIDER_SITE_OTHER): Payer: Medicare Other | Admitting: Ophthalmology

## 2018-09-17 ENCOUNTER — Ambulatory Visit (INDEPENDENT_AMBULATORY_CARE_PROVIDER_SITE_OTHER): Payer: Medicare Other

## 2018-09-17 DIAGNOSIS — I495 Sick sinus syndrome: Secondary | ICD-10-CM

## 2018-09-18 LAB — CUP PACEART REMOTE DEVICE CHECK
Battery Remaining Longevity: 109 mo
Brady Statistic RV Percent Paced: 88 %
Date Time Interrogation Session: 20200106074427
Implantable Lead Implant Date: 20130801
Implantable Lead Location: 753860
Implantable Pulse Generator Implant Date: 20130801
Lead Channel Impedance Value: 660 Ohm
Lead Channel Pacing Threshold Amplitude: 0.75 V
Lead Channel Pacing Threshold Pulse Width: 0.4 ms
Lead Channel Sensing Intrinsic Amplitude: 12 mV
Lead Channel Setting Pacing Amplitude: 2.5 V
Lead Channel Setting Pacing Pulse Width: 0.4 ms
Lead Channel Setting Sensing Sensitivity: 2 mV
MDC IDC LEAD IMPLANT DT: 20130801
MDC IDC LEAD LOCATION: 753859
MDC IDC MSMT BATTERY REMAINING PERCENTAGE: 81 %
MDC IDC MSMT BATTERY VOLTAGE: 2.93 V
Pulse Gen Serial Number: 7377947

## 2018-09-18 NOTE — Progress Notes (Signed)
Remote pacemaker transmission.   

## 2018-09-21 DIAGNOSIS — Z7901 Long term (current) use of anticoagulants: Secondary | ICD-10-CM | POA: Diagnosis not present

## 2018-10-02 ENCOUNTER — Encounter (INDEPENDENT_AMBULATORY_CARE_PROVIDER_SITE_OTHER): Payer: Self-pay | Admitting: *Deleted

## 2018-10-04 ENCOUNTER — Other Ambulatory Visit (INDEPENDENT_AMBULATORY_CARE_PROVIDER_SITE_OTHER): Payer: Self-pay | Admitting: *Deleted

## 2018-10-04 DIAGNOSIS — Z86010 Personal history of colonic polyps: Secondary | ICD-10-CM

## 2018-10-16 ENCOUNTER — Other Ambulatory Visit: Payer: Self-pay | Admitting: Internal Medicine

## 2018-10-29 DIAGNOSIS — Z7901 Long term (current) use of anticoagulants: Secondary | ICD-10-CM | POA: Diagnosis not present

## 2018-11-27 DIAGNOSIS — M1009 Idiopathic gout, multiple sites: Secondary | ICD-10-CM | POA: Diagnosis not present

## 2018-11-27 DIAGNOSIS — K210 Gastro-esophageal reflux disease with esophagitis: Secondary | ICD-10-CM | POA: Diagnosis not present

## 2018-11-27 DIAGNOSIS — Z7901 Long term (current) use of anticoagulants: Secondary | ICD-10-CM | POA: Diagnosis not present

## 2018-11-27 DIAGNOSIS — E1142 Type 2 diabetes mellitus with diabetic polyneuropathy: Secondary | ICD-10-CM | POA: Diagnosis not present

## 2018-11-27 DIAGNOSIS — Z125 Encounter for screening for malignant neoplasm of prostate: Secondary | ICD-10-CM | POA: Diagnosis not present

## 2018-11-27 DIAGNOSIS — E7849 Other hyperlipidemia: Secondary | ICD-10-CM | POA: Diagnosis not present

## 2018-11-27 DIAGNOSIS — I10 Essential (primary) hypertension: Secondary | ICD-10-CM | POA: Diagnosis not present

## 2018-12-11 ENCOUNTER — Encounter (INDEPENDENT_AMBULATORY_CARE_PROVIDER_SITE_OTHER): Payer: Self-pay | Admitting: *Deleted

## 2018-12-11 ENCOUNTER — Telehealth (INDEPENDENT_AMBULATORY_CARE_PROVIDER_SITE_OTHER): Payer: Self-pay | Admitting: *Deleted

## 2018-12-11 MED ORDER — PEG 3350-KCL-NA BICARB-NACL 420 G PO SOLR: 4000.0000 mL | Freq: Once | ORAL | 0 refills | Status: AC

## 2018-12-11 NOTE — Telephone Encounter (Signed)
Patient needs trilyte 

## 2018-12-14 DIAGNOSIS — Z7901 Long term (current) use of anticoagulants: Secondary | ICD-10-CM | POA: Diagnosis not present

## 2018-12-17 ENCOUNTER — Other Ambulatory Visit: Payer: Self-pay

## 2018-12-17 ENCOUNTER — Ambulatory Visit (INDEPENDENT_AMBULATORY_CARE_PROVIDER_SITE_OTHER): Payer: Medicare Other | Admitting: *Deleted

## 2018-12-17 DIAGNOSIS — I495 Sick sinus syndrome: Secondary | ICD-10-CM

## 2018-12-17 LAB — CUP PACEART REMOTE DEVICE CHECK
Battery Remaining Longevity: 109 mo
Battery Remaining Percentage: 81 %
Battery Voltage: 2.93 V
Brady Statistic RV Percent Paced: 87 %
Date Time Interrogation Session: 20200406061718
Implantable Lead Implant Date: 20130801
Implantable Lead Implant Date: 20130801
Implantable Lead Location: 753859
Implantable Lead Location: 753860
Implantable Lead Model: 1948
Implantable Pulse Generator Implant Date: 20130801
Lead Channel Impedance Value: 660 Ohm
Lead Channel Pacing Threshold Amplitude: 0.75 V
Lead Channel Pacing Threshold Pulse Width: 0.4 ms
Lead Channel Sensing Intrinsic Amplitude: 10.6 mV
Lead Channel Setting Pacing Amplitude: 2.5 V
Lead Channel Setting Pacing Pulse Width: 0.4 ms
Lead Channel Setting Sensing Sensitivity: 2 mV
Pulse Gen Model: 2210
Pulse Gen Serial Number: 7377947

## 2018-12-24 ENCOUNTER — Telehealth: Payer: Self-pay | Admitting: Internal Medicine

## 2018-12-24 ENCOUNTER — Encounter: Payer: Self-pay | Admitting: Cardiology

## 2018-12-24 NOTE — Telephone Encounter (Signed)
Attempted to call pt back several times. The call will not go through. Transmission received

## 2018-12-24 NOTE — Telephone Encounter (Signed)
Attempted to call PT back at home and work # with no answer at both numbers.

## 2018-12-24 NOTE — Telephone Encounter (Signed)
Paul Dunn called [facility] stating that he thought he had missed his remote check on 12/17/2018. Please call patient.

## 2018-12-24 NOTE — Progress Notes (Signed)
Remote pacemaker transmission.   

## 2019-01-14 DIAGNOSIS — Z7901 Long term (current) use of anticoagulants: Secondary | ICD-10-CM | POA: Diagnosis not present

## 2019-02-05 DIAGNOSIS — X32XXXD Exposure to sunlight, subsequent encounter: Secondary | ICD-10-CM | POA: Diagnosis not present

## 2019-02-05 DIAGNOSIS — C4441 Basal cell carcinoma of skin of scalp and neck: Secondary | ICD-10-CM | POA: Diagnosis not present

## 2019-02-05 DIAGNOSIS — L570 Actinic keratosis: Secondary | ICD-10-CM | POA: Diagnosis not present

## 2019-03-05 DIAGNOSIS — E1142 Type 2 diabetes mellitus with diabetic polyneuropathy: Secondary | ICD-10-CM | POA: Diagnosis not present

## 2019-03-05 DIAGNOSIS — M1009 Idiopathic gout, multiple sites: Secondary | ICD-10-CM | POA: Diagnosis not present

## 2019-03-05 DIAGNOSIS — K210 Gastro-esophageal reflux disease with esophagitis: Secondary | ICD-10-CM | POA: Diagnosis not present

## 2019-03-05 DIAGNOSIS — E7849 Other hyperlipidemia: Secondary | ICD-10-CM | POA: Diagnosis not present

## 2019-03-05 DIAGNOSIS — I10 Essential (primary) hypertension: Secondary | ICD-10-CM | POA: Diagnosis not present

## 2019-03-05 DIAGNOSIS — Z7901 Long term (current) use of anticoagulants: Secondary | ICD-10-CM | POA: Diagnosis not present

## 2019-03-18 ENCOUNTER — Ambulatory Visit (INDEPENDENT_AMBULATORY_CARE_PROVIDER_SITE_OTHER): Payer: Medicare Other | Admitting: *Deleted

## 2019-03-18 DIAGNOSIS — I495 Sick sinus syndrome: Secondary | ICD-10-CM

## 2019-03-18 LAB — CUP PACEART REMOTE DEVICE CHECK
Date Time Interrogation Session: 20200706115050
Implantable Lead Implant Date: 20130801
Implantable Lead Implant Date: 20130801
Implantable Lead Location: 753859
Implantable Lead Location: 753860
Implantable Lead Model: 1948
Implantable Pulse Generator Implant Date: 20130801
Pulse Gen Model: 2210
Pulse Gen Serial Number: 7377947

## 2019-03-24 ENCOUNTER — Encounter: Payer: Self-pay | Admitting: Cardiology

## 2019-03-24 NOTE — Progress Notes (Signed)
Remote pacemaker transmission.   

## 2019-03-28 DIAGNOSIS — C4441 Basal cell carcinoma of skin of scalp and neck: Secondary | ICD-10-CM | POA: Diagnosis not present

## 2019-04-11 DIAGNOSIS — Z7901 Long term (current) use of anticoagulants: Secondary | ICD-10-CM | POA: Diagnosis not present

## 2019-04-16 DIAGNOSIS — C4441 Basal cell carcinoma of skin of scalp and neck: Secondary | ICD-10-CM | POA: Diagnosis not present

## 2019-04-19 ENCOUNTER — Encounter: Payer: Medicare Other | Admitting: Internal Medicine

## 2019-04-22 ENCOUNTER — Encounter (INDEPENDENT_AMBULATORY_CARE_PROVIDER_SITE_OTHER): Payer: Self-pay | Admitting: *Deleted

## 2019-04-22 ENCOUNTER — Telehealth (INDEPENDENT_AMBULATORY_CARE_PROVIDER_SITE_OTHER): Payer: Self-pay | Admitting: *Deleted

## 2019-04-22 ENCOUNTER — Ambulatory Visit (INDEPENDENT_AMBULATORY_CARE_PROVIDER_SITE_OTHER): Payer: Self-pay

## 2019-04-22 ENCOUNTER — Other Ambulatory Visit: Payer: Self-pay

## 2019-04-22 DIAGNOSIS — Z7901 Long term (current) use of anticoagulants: Secondary | ICD-10-CM | POA: Diagnosis not present

## 2019-04-22 NOTE — Telephone Encounter (Signed)
Referring MD/PCP: hasanaj   Procedure: tcs  Reason/Indication:  Hx polyps  Has patient had this procedure before?  Yes, 2013 - scanned  If so, when, by whom and where?    Is there a family history of colon cancer?  no  Who?  What age when diagnosed?    Is patient diabetic?   yes      Does patient have prosthetic heart valve or mechanical valve?  no  Do you have a pacemaker/defibrillator?  Yes - pacemaker  Has patient ever had endocarditis/atrial fibrillation? no  Does patient use oxygen?  Has patient had joint replacement within last 12 months?  no  Is patient constipated or do they take laxatives? no  Does patient have a history of alcohol/drug use?  no  Is patient on blood thinner such as Coumadin, Plavix and/or Aspirin? yes  Medications: warfarin 5 mg daily, diltiazem 180 mg bid, lisinopril/hctz 20/12.5 mg daily, metformin 500 mg daily, glipizide 2.5 mg daily, allopurinol 300 mg daily, pantoprazole 40 mg daily, rosuvastatin 5 mg mon, wed & fri, ARED2 bid, cinnamon 500 mg tid, fish oil 1000 mg tid, zinc 50 mg daily, vit d3 1000 iu daily, clacium w vit d daily  Allergies: nkda  Medication Adjustment per Dr Lindi Adie, NP:   warfarin 5 days before procedure -- message left for PCP to call if ok or not  Hold metformin evening before and morning of procedure  Hold glipizide morning of procedure  Procedure date & time: 05/22/19 at 1030

## 2019-04-23 ENCOUNTER — Telehealth (INDEPENDENT_AMBULATORY_CARE_PROVIDER_SITE_OTHER): Payer: Self-pay | Admitting: *Deleted

## 2019-04-23 ENCOUNTER — Encounter (INDEPENDENT_AMBULATORY_CARE_PROVIDER_SITE_OTHER): Payer: Self-pay | Admitting: *Deleted

## 2019-04-23 NOTE — Telephone Encounter (Signed)
Insurance won't pay for an OV for this

## 2019-04-23 NOTE — Telephone Encounter (Signed)
Per [employer] with Dr Sherrie Sport - it is ok for patient to hold coumadin 5 days prior to TCS sch'd 05/22/19 - patient aware

## 2019-04-23 NOTE — Telephone Encounter (Signed)
Patient should schedule office visit prior to colonoscopy on Coumadin, has a pacemaker, DM II

## 2019-04-25 ENCOUNTER — Telehealth (INDEPENDENT_AMBULATORY_CARE_PROVIDER_SITE_OTHER): Payer: Self-pay | Admitting: *Deleted

## 2019-04-25 NOTE — Telephone Encounter (Signed)
Referring MD/PCP: Paul Dunn   Procedure: tcs  Reason/Indication:  Hx polyps  Has patient had this procedure before?  Yes, 2013 - scanned             If so, when, by whom and where?    Is there a family history of colon cancer?  no             Who?  What age when diagnosed?    Is patient diabetic?   yes                                                  Does patient have prosthetic heart valve or mechanical valve?  no  Do you have a pacemaker/defibrillator?  Yes - pacemaker  Has patient ever had endocarditis/atrial fibrillation? no  Does patient use oxygen?  Has patient had joint replacement within last 12 months?  no  Is patient constipated or do they take laxatives? no  Does patient have a history of alcohol/drug use?  no  Is patient on blood thinner such as Coumadin, Plavix and/or Aspirin? yes  Medications: warfarin 5 mg daily, diltiazem 180 mg bid, lisinopril/hctz 20/12.5 mg daily, metformin 500 mg daily, glipizide 2.5 mg daily, allopurinol 300 mg daily, pantoprazole 40 mg daily, rosuvastatin 5 mg mon, wed & fri, ARED2 bid, cinnamon 500 mg tid, fish oil 1000 mg tid, zinc 50 mg daily, vit d3 1000 iu daily, clacium w vit d daily  Allergies: nkda  Medication Adjustment per Dr Lindi Adie, NP:              warfarin 5 days before procedure -- message left for PCP to call if ok or not             Hold metformin evening before and morning of procedure             Hold glipizide morning of procedure  Procedure date & time: 05/22/19 at 1030

## 2019-04-25 NOTE — Telephone Encounter (Signed)
Ann, pls request copy of most recent physical from his PCP, if he had an appropriate physical within the year I will ok him for colonoscopy, if not, then Dr. Laural Golden to verify if ok to proceed with colonoscopy. thx

## 2019-04-25 NOTE — Telephone Encounter (Signed)
Patient scheduled for colonoscopy next month.

## 2019-04-25 NOTE — Telephone Encounter (Signed)
Called PCP, patient has had a wellness visit this year.

## 2019-05-13 DIAGNOSIS — Z7901 Long term (current) use of anticoagulants: Secondary | ICD-10-CM | POA: Diagnosis not present

## 2019-05-21 ENCOUNTER — Other Ambulatory Visit: Payer: Self-pay

## 2019-05-21 ENCOUNTER — Other Ambulatory Visit (HOSPITAL_COMMUNITY)
Admission: RE | Admit: 2019-05-21 | Discharge: 2019-05-21 | Disposition: A | Discharge status: Home / Self Care | Payer: Medicare Other | Source: Ambulatory Visit | Attending: Internal Medicine | Admitting: Internal Medicine

## 2019-05-21 DIAGNOSIS — Z01812 Encounter for preprocedural laboratory examination: Secondary | ICD-10-CM | POA: Diagnosis not present

## 2019-05-21 DIAGNOSIS — Z20828 Contact with and (suspected) exposure to other viral communicable diseases: Secondary | ICD-10-CM | POA: Insufficient documentation

## 2019-05-22 ENCOUNTER — Ambulatory Visit (HOSPITAL_COMMUNITY)
Admission: RE | Admit: 2019-05-22 | Discharge: 2019-05-22 | Disposition: A | Discharge status: Home / Self Care | Payer: Medicare Other | Attending: Internal Medicine | Admitting: Internal Medicine

## 2019-05-22 ENCOUNTER — Other Ambulatory Visit: Payer: Self-pay

## 2019-05-22 ENCOUNTER — Encounter (HOSPITAL_COMMUNITY)
Admission: RE | Disposition: A | Discharge status: Home / Self Care | Payer: Self-pay | Source: Home / Self Care | Attending: Internal Medicine

## 2019-05-22 ENCOUNTER — Encounter (HOSPITAL_COMMUNITY): Payer: Self-pay | Admitting: *Deleted

## 2019-05-22 DIAGNOSIS — Z7901 Long term (current) use of anticoagulants: Secondary | ICD-10-CM | POA: Diagnosis not present

## 2019-05-22 DIAGNOSIS — Z888 Allergy status to other drugs, medicaments and biological substances status: Secondary | ICD-10-CM | POA: Insufficient documentation

## 2019-05-22 DIAGNOSIS — D123 Benign neoplasm of transverse colon: Secondary | ICD-10-CM | POA: Diagnosis not present

## 2019-05-22 DIAGNOSIS — Z860100 Personal history of colon polyps, unspecified: Secondary | ICD-10-CM | POA: Insufficient documentation

## 2019-05-22 DIAGNOSIS — K219 Gastro-esophageal reflux disease without esophagitis: Secondary | ICD-10-CM | POA: Diagnosis not present

## 2019-05-22 DIAGNOSIS — K644 Residual hemorrhoidal skin tags: Secondary | ICD-10-CM | POA: Diagnosis not present

## 2019-05-22 DIAGNOSIS — D122 Benign neoplasm of ascending colon: Secondary | ICD-10-CM | POA: Insufficient documentation

## 2019-05-22 DIAGNOSIS — Z79899 Other long term (current) drug therapy: Secondary | ICD-10-CM | POA: Insufficient documentation

## 2019-05-22 DIAGNOSIS — Z1211 Encounter for screening for malignant neoplasm of colon: Secondary | ICD-10-CM | POA: Insufficient documentation

## 2019-05-22 DIAGNOSIS — Z87891 Personal history of nicotine dependence: Secondary | ICD-10-CM | POA: Insufficient documentation

## 2019-05-22 DIAGNOSIS — Z91013 Allergy to seafood: Secondary | ICD-10-CM | POA: Diagnosis not present

## 2019-05-22 DIAGNOSIS — E1151 Type 2 diabetes mellitus with diabetic peripheral angiopathy without gangrene: Secondary | ICD-10-CM | POA: Insufficient documentation

## 2019-05-22 DIAGNOSIS — Z791 Long term (current) use of non-steroidal anti-inflammatories (NSAID): Secondary | ICD-10-CM | POA: Insufficient documentation

## 2019-05-22 DIAGNOSIS — R911 Solitary pulmonary nodule: Secondary | ICD-10-CM | POA: Diagnosis not present

## 2019-05-22 DIAGNOSIS — Z7984 Long term (current) use of oral hypoglycemic drugs: Secondary | ICD-10-CM | POA: Diagnosis not present

## 2019-05-22 DIAGNOSIS — M109 Gout, unspecified: Secondary | ICD-10-CM | POA: Insufficient documentation

## 2019-05-22 DIAGNOSIS — I10 Essential (primary) hypertension: Secondary | ICD-10-CM | POA: Diagnosis not present

## 2019-05-22 DIAGNOSIS — I4811 Longstanding persistent atrial fibrillation: Secondary | ICD-10-CM | POA: Diagnosis not present

## 2019-05-22 DIAGNOSIS — D120 Benign neoplasm of cecum: Secondary | ICD-10-CM | POA: Diagnosis not present

## 2019-05-22 DIAGNOSIS — Z950 Presence of cardiac pacemaker: Secondary | ICD-10-CM | POA: Diagnosis not present

## 2019-05-22 DIAGNOSIS — E7800 Pure hypercholesterolemia, unspecified: Secondary | ICD-10-CM | POA: Insufficient documentation

## 2019-05-22 DIAGNOSIS — Z09 Encounter for follow-up examination after completed treatment for conditions other than malignant neoplasm: Secondary | ICD-10-CM | POA: Diagnosis not present

## 2019-05-22 HISTORY — PX: POLYPECTOMY: SHX5525

## 2019-05-22 HISTORY — PX: COLONOSCOPY: SHX5424

## 2019-05-22 LAB — SARS CORONAVIRUS 2 (TAT 6-24 HRS): SARS Coronavirus 2: NEGATIVE

## 2019-05-22 LAB — GLUCOSE, CAPILLARY: Glucose-Capillary: 105 mg/dL — ABNORMAL HIGH (ref 70–99)

## 2019-05-22 SURGERY — COLONOSCOPY
Anesthesia: Moderate Sedation

## 2019-05-22 MED ORDER — STERILE WATER FOR IRRIGATION IR SOLN
Status: DC | PRN
  Administered 2019-05-22: 1.5 mL

## 2019-05-22 MED ORDER — MIDAZOLAM HCL 5 MG/5ML IJ SOLN
INTRAMUSCULAR | Status: DC | PRN
  Administered 2019-05-22 (×3): 2 mg via INTRAVENOUS

## 2019-05-22 MED ORDER — SODIUM CHLORIDE 0.9 % IV SOLN
INTRAVENOUS | Status: DC
  Administered 2019-05-22: 1000 mL via INTRAVENOUS

## 2019-05-22 MED ORDER — MEPERIDINE HCL 50 MG/ML IJ SOLN
INTRAMUSCULAR | Status: DC | PRN
  Administered 2019-05-22 (×2): 25 mg

## 2019-05-22 MED ORDER — MEPERIDINE HCL 50 MG/ML IJ SOLN
INTRAMUSCULAR | Status: AC
  Filled 2019-05-22: qty 1

## 2019-05-22 MED ORDER — MIDAZOLAM HCL 5 MG/5ML IJ SOLN
INTRAMUSCULAR | Status: AC
  Filled 2019-05-22: qty 10

## 2019-05-22 NOTE — Discharge Instructions (Signed)
Resume warfarin at usual dose starting this evening and get INR checked in 1 week.. Consider stopping Celebrex as the risk of GI bleed significant while you are on warfarin. Resume other medications as before. Resume usual diet. No driving for 24 hours. Physician will call with biopsy results.        Colonoscopy, Adult, Care After This sheet gives you information about how to care for yourself after your procedure. Your doctor may also give you more specific instructions. If you have problems or questions, call your doctor. What can I expect after the procedure? After the procedure, it is common to have:  A small amount of blood in your poop for 24 hours.  Some gas.  Mild cramping or bloating in your belly. Follow these instructions at home: General instructions  For the first 24 hours after the procedure: ? Do not drive or use machinery. ? Do not sign important documents. ? Do not drink alcohol. ? Do your daily activities more slowly than normal. ? Eat foods that are soft and easy to digest.  Take over-the-counter or prescription medicines only as told by your doctor. To help cramping and bloating:   Try walking around.  Put heat on your belly (abdomen) as told by your doctor. Use a heat source that your doctor recommends, such as a moist heat pack or a heating pad. ? Put a towel between your skin and the heat source. ? Leave the heat on for 20-30 minutes. ? Remove the heat if your skin turns bright red. This is especially important if you cannot feel pain, heat, or cold. You can get burned. Eating and drinking   Drink enough fluid to keep your pee (urine) clear or pale yellow.  Return to your normal diet as told by your doctor. Avoid heavy or fried foods that are hard to digest.  Avoid drinking alcohol for as long as told by your doctor. Contact a doctor if:  You have blood in your poop (stool) 2-3 days after the procedure. Get help right away if:  You have  more than a small amount of blood in your poop.  You see large clumps of tissue (blood clots) in your poop.  Your belly is swollen.  You feel sick to your stomach (nauseous).  You throw up (vomit).  You have a fever.  You have belly pain that gets worse, and medicine does not help your pain. Summary  After the procedure, it is common to have a small amount of blood in your poop. You may also have mild cramping and bloating in your belly.  For the first 24 hours after the procedure, do not drive or use machinery, do not sign important documents, and do not drink alcohol.  Get help right away if you have a lot of blood in your poop, feel sick to your stomach, have a fever, or have more belly pain. This information is not intended to replace advice given to you by your health care provider. Make sure you discuss any questions you have with your health care provider. Document Released: 10/01/2010 Document Revised: 06/29/2017 Document Reviewed: 05/23/2016 Elsevier Patient Education  2020 [place].      Colon Polyps  Polyps are tissue growths inside the body. Polyps can grow in many places, including the large intestine (colon). A polyp may be a round bump or a mushroom-shaped growth. You could have one polyp or several. Most colon polyps are noncancerous (benign). However, some colon polyps can become cancerous  over time. Finding and removing the polyps early can help prevent this. What are the causes? The exact cause of colon polyps is not known. What increases the risk? You are more likely to develop this condition if you:  Have a family history of colon cancer or colon polyps.  Are older than 49 or older than 45 if you are African American.  Have inflammatory bowel disease, such as ulcerative colitis or Crohn's disease.  Have certain hereditary conditions, such as: ? Familial adenomatous polyposis. ? Lynch syndrome. ? Turcot syndrome. ? Peutz-Jeghers syndrome.  Are  overweight.  Smoke cigarettes.  Do not get enough exercise.  Drink too much alcohol.  Eat a diet that is high in fat and red meat and low in fiber.  Had childhood cancer that was treated with abdominal radiation. What are the signs or symptoms? Most polyps do not cause symptoms. If you have symptoms, they may include:  Blood coming from your rectum when having a bowel movement.  Blood in your stool. The stool may look dark red or black.  Abdominal pain.  A change in bowel habits, such as constipation or diarrhea. How is this diagnosed? This condition is diagnosed with a colonoscopy. This is a procedure in which a lighted, flexible scope is inserted into the anus and then passed into the colon to examine the area. Polyps are sometimes found when a colonoscopy is done as part of routine cancer screening tests. How is this treated? Treatment for this condition involves removing any polyps that are found. Most polyps can be removed during a colonoscopy. Those polyps will then be tested for cancer. Additional treatment may be needed depending on the results of testing. Follow these instructions at home: Lifestyle  Maintain a healthy weight, or lose weight if recommended by your health care provider.  Exercise every day or as told by your health care provider.  Do not use any products that contain nicotine or tobacco, such as cigarettes and e-cigarettes. If you need help quitting, ask your health care provider.  If you drink alcohol, limit how much you have: ? 0-1 drink a day for women. ? 0-2 drinks a day for men.  Be aware of how much alcohol is in your drink. In the [place] one drink equals one 12 oz bottle of beer (355 mL), one 5 oz glass of wine (148 mL), or one 1 oz shot of hard liquor (44 mL). Eating and drinking   Eat foods that are high in fiber, such as fruits, vegetables, and whole grains.  Eat foods that are high in calcium and vitamin D, such as milk, cheese, yogurt,  eggs, liver, fish, and broccoli.  Limit foods that are high in fat, such as fried foods and desserts.  Limit the amount of red meat and processed meat you eat, such as hot dogs, sausage, bacon, and lunch meats. General instructions  Keep all follow-up visits as told by your health care provider. This is important. ? This includes having regularly scheduled colonoscopies. ? Talk to your health care provider about when you need a colonoscopy. Contact a health care provider if:  You have new or worsening bleeding during a bowel movement.  You have new or increased blood in your stool.  You have a change in bowel habits.  You lose weight for no known reason. Summary  Polyps are tissue growths inside the body. Polyps can grow in many places, including the colon.  Most colon polyps are noncancerous (benign), but some  can become cancerous over time.  This condition is diagnosed with a colonoscopy.  Treatment for this condition involves removing any polyps that are found. Most polyps can be removed during a colonoscopy. This information is not intended to replace advice given to you by your health care provider. Make sure you discuss any questions you have with your health care provider. Document Released: 05/25/2004 Document Revised: 12/14/2017 Document Reviewed: 12/14/2017 Elsevier Patient Education  2020 [employer].

## 2019-05-22 NOTE — H&P (Signed)
Paul Dunn is an 69 y.o. male.   Chief Complaint: Patient is here for colonoscopy. HPI: Patient is 68 year old Caucasian male with medical problems including history of A. fib who has permanent pacemaker as well as on anticoagulant(Coumadin on hold) also has a history of colonic polyps and is here for surveillance colonoscopy.  Last exam was in December 2013 by Dr. Doristine Mango of [facility] and he had multiple small polyps removed and some of these were tubular adenomas.  He states he has had polyps removed and one prior colonoscopy as well.  This is patient's fourth exam. He denies abdominal pain change in bowel habits or rectal bleeding. Family history is negative for CRC. Last warfarin dose was 5 days ago.  Past Medical History:  Diagnosis Date  . Asthma    "when I was a child"  . Atrial fibrillation ([address])    long standing persistent  . GERD (gastroesophageal reflux disease)   . Gouty arthritis   . H/O hiatal hernia   . High cholesterol   . History of blood transfusion ~ 1984  . History of bronchitis    "used to get it twice/year; last time was ~ 2 yr ago" (04/12/2012)  . HTN (hypertension)   . Kidney cysts    "never treated it"  . Peripheral vascular disease ([place])   . Pulmonary nodule   . 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 Type 2 diabetes mellitus ([place])     Past Surgical History:  Procedure Laterality Date  . CARDIAC CATHETERIZATION N/A 06/23/2016   Procedure: Left Heart Cath and Coronary Angiography;  Surgeon: Nelva Bush, MD;  Location: [place] [facility];  Service: Cardiovascular;  Laterality: N/A;  . ESOPHAGEAL DILATION  01/2012  . LACERATION REPAIR  ~ 1984   left forearm; "cut it w/a power saw"  . MELANOMA EXCISION  ~ 2011   left posterior shoulder  . PACEMAKER INSERTION  04/12/2012   SJM Acc[employer] DR RF pacemaker implanted by Dr [name] for tachy/brady syndrome  . PERMANENT PACEMAKER INSERTION N/A  04/12/2012   Procedure: PERMANENT PACEMAKER INSERTION;  Surgeon: Thompson Grayer, MD;  Location: [facility] CATH LAB;  Service: Cardiovascular;  Laterality: N/A;    Family History  Problem Relation Age of Onset  . Lung cancer Father   . Ulcers Mother    Social History:  reports that he quit smoking about 27 years ago. His smoking use included cigarettes. He started smoking about 29 years ago. He has a 2.00 pack-year smoking history. He quit smokeless tobacco use about 22 years ago.  His smokeless tobacco use included chew. He reports that he does not drink alcohol or use drugs.  Allergies:  Allergies  Allergen Reactions  . Shellfish Allergy Nausea And Vomiting  . Pseudoephedrine          Medications Prior to Admission  Medication Sig Dispense Refill  . allopurinol (ZYLOPRIM) 300 MG tablet Take 300 mg by mouth daily.    . Calcium Carb-Cholecalciferol (CALCIUM 500 + D) 500-200 MG-UNIT TABS Take 1 tablet by mouth daily.    . celecoxib (CELEBREX) 100 MG capsule Take 100 mg by mouth daily.     . cholecalciferol (VITAMIN D) 1000 units tablet Take 2,000 Units by mouth every evening.     Marland Kitchen CINNAMON PO Take 1,000 mg by mouth 2 (two) times daily.     . clonazePAM (KLONOPIN) 1 MG tablet Take 1 mg by mouth at bedtime.    Marland Kitchen  diltiazem (DILACOR XR) 180 MG 24 hr capsule TAKE TWO CAPSULES EACH DAY (Patient taking differently: Take 360 mg by mouth daily. ) 180 capsule 1  . glipiZIDE (GLUCOTROL XL) 2.5 MG 24 hr tablet Take 2.5 mg by mouth daily.    Marland Kitchen lisinopril-hydrochlorothiazide (PRINZIDE,ZESTORETIC) 20-12.5 MG tablet Take 1 tablet by mouth daily.    . metFORMIN (GLUCOPHAGE) 500 MG tablet Take 1 tablet (500 mg total) by mouth daily with breakfast.    . Multiple Vitamins-Minerals (ICAPS) TABS Take 1 tablet by mouth 2 (two) times daily.    . Omega-3 Fatty Acids (FISH OIL) 1000 MG CAPS Take 1,000 mg by mouth 2 (two) times daily.     . pantoprazole (PROTONIX) 40 MG tablet Take 40 mg by mouth daily.    .  rosuvastatin (CRESTOR) 5 MG tablet TAKE 1 TABLET EVERY MONDAY, WEDNESDAY, &FRIDAY (Patient taking differently: Take 5 mg by mouth every Monday, Wednesday, and Friday. ) 38 tablet 2  . warfarin (COUMADIN) 5 MG tablet Take 5-7.5 mg by mouth See admin instructions. Take 5 mg by mouth daily except for Tuesday and Thursday take 7.5 mg    . Zinc 50 MG TABS Take 50 mg by mouth daily.       Results for orders placed or performed during the hospital encounter of 05/22/19 (from the past 48 hour(s))  Glucose, capillary     Status: Abnormal   Collection Time: 05/22/19  9:48 AM  Result Value Ref Range   Glucose-Capillary 105 (H) 70 - 99 mg/dL   No results found.  ROS  Blood pressure (!) 139/94, pulse 66, temperature 97.7 F (36.5 C), temperature source Oral, resp. rate 13, height 6\' 1"  (1.854 m), weight 90.7 kg, SpO2 97 %. Physical Exam  Constitutional: He appears well-developed and well-nourished.  HENT:  Mouth/Throat: Oropharynx is clear and moist.  Eyes: Conjunctivae are normal. No scleral icterus.  Neck: No thyromegaly present.  Cardiovascular:  Occasional irregularity.  Normal S1 and S2.  No murmur gallop noted.  Respiratory: Effort normal and breath sounds normal.  He has a pacemaker in left pectoral region.  GI: Soft. He exhibits no distension and no mass. There is no abdominal tenderness.  Musculoskeletal:        General: No edema.  Lymphadenopathy:    He has no cervical adenopathy.  Neurological: He is alert.  Skin: Skin is warm and dry.     Assessment/Plan History of colonic adenomas Surveillance colonoscopy.  Hildred Laser, MD 05/22/2019, 10:18 AM

## 2019-05-22 NOTE — Op Note (Signed)
[facility] Patient Name: Paul Dunn Procedure Date: 05/22/2019 10:08 AM MRN: [id number] Date of Birth: 1950-12-08 Attending MD: Hildred Laser , MD CSN: [id number] Age: 68 Admit Type: Outpatient Procedure:                Colonoscopy Indications:              High risk colon cancer surveillance: Personal                            history of colonic polyps Providers:                Hildred Laser, MD, Gerome Sam, RN, Raphael Gibney, Technician Referring MD:             Stoney Bang, MD Medicines:                Meperidine 50 mg IV, Midazolam 6 mg IV Complications:            No immediate complications. Estimated Blood Loss:     Estimated blood loss was minimal. Procedure:                Pre-Anesthesia Assessment:                           - Prior to the procedure, a History and Physical                            was performed, and patient medications and                            allergies were reviewed. The patient's tolerance of                            previous anesthesia was also reviewed. The risks                            and benefits of the procedure and the sedation                            options and risks were discussed with the patient.                            All questions were answered, and informed consent                            was obtained. Prior Anticoagulants: The patient                            last took Coumadin (warfarin) 5 days prior to the                            procedure and has taken no previous anticoagulant  or antiplatelet agents except for NSAID medication.                            ASA Grade Assessment: III - A patient with severe                            systemic disease. After reviewing the risks and                            benefits, the patient was deemed in satisfactory                            condition to undergo the procedure.  After obtaining informed consent, the colonoscope                            was passed under direct vision. Throughout the                            procedure, the patient's blood pressure, pulse, and                            oxygen saturations were monitored continuously. The                            [id number]-[id number]) scope was introduced through                            the anus and advanced to the the cecum, identified                            by appendiceal orifice and ileocecal valve. The                            colonoscopy was performed without difficulty. The                            patient tolerated the procedure well. The quality                            of the bowel preparation was excellent. The                            ileocecal valve, appendiceal orifice, and rectum                            were photographed. Scope In: 10:30:59 AM Scope Out: 10:55:53 AM Scope Withdrawal Time: 0 hours 19 minutes 45 seconds  Total Procedure Duration: 0 hours 24 minutes 54 seconds  Findings:      The perianal and digital rectal examinations were normal.      Multiple sessile polyps were found in the transverse colon, ascending       colon and cecum. The polyps were small in size. These were biopsied with       a  cold forceps for histology. The pathology specimen was placed into       Bottle Number 1.      The exam was otherwise normal throughout the examined colon.      External hemorrhoids were found during retroflexion. The hemorrhoids       were small. Impression:               - Multiple small polyps in the transverse colon, in                            the ascending colon and in the cecum. Biopsied.                           - External hemorrhoids. Moderate Sedation:      Moderate (conscious) sedation was administered by the endoscopy nurse       and supervised by the endoscopist. The following parameters were       monitored: oxygen saturation, heart rate,  blood pressure, CO2       capnography and response to care. Total physician intraservice time was       32 minutes. Recommendation:           - Patient has a contact number available for                            emergencies. The signs and symptoms of potential                            delayed complications were discussed with the                            patient. Return to normal activities tomorrow.                            Written discharge instructions were provided to the                            patient.                           - Resume previous diet today.                           - Continue present medications.                           - Await pathology results.                           - Repeat colonoscopy is recommended for                            surveillance. The colonoscopy date will be                            determined after pathology results from today's  exam become available for review. Procedure Code(s):        --- Professional ---                           [phone], Colonoscopy, flexible; with biopsy, single                            or multiple                           99153, Moderate sedation; each additional 15                            minutes intraservice time                           G0500, Moderate sedation services provided by the                            same physician or other qualified health care                            professional performing a gastrointestinal                            endoscopic service that sedation supports,                            requiring the presence of an independent trained                            observer to assist in the monitoring of the                            patient's level of consciousness and physiological                            status; initial 15 minutes of intra-service time;                            patient age 25 years or older (additional time may                             be reported with [phone], as appropriate) Diagnosis Code(s):        --- Professional ---                           Z86.010, Personal history of colonic polyps                           K63.5, Polyp of colon                           K64.4, Residual hemorrhoidal skin tags CPT copyright 2019 American Medical Association. All rights reserved. The codes documented in this report are preliminary and upon  coder review may  be revised to meet current compliance requirements. Hildred Laser, MD Hildred Laser, MD 05/22/2019 11:09:18 AM This report has been signed electronically. Number of Addenda: 0

## 2019-05-24 ENCOUNTER — Encounter (HOSPITAL_COMMUNITY): Payer: Self-pay | Admitting: Internal Medicine

## 2019-06-03 DIAGNOSIS — Z7901 Long term (current) use of anticoagulants: Secondary | ICD-10-CM | POA: Diagnosis not present

## 2019-06-06 DIAGNOSIS — H40033 Anatomical narrow angle, bilateral: Secondary | ICD-10-CM | POA: Diagnosis not present

## 2019-06-06 DIAGNOSIS — E119 Type 2 diabetes mellitus without complications: Secondary | ICD-10-CM | POA: Diagnosis not present

## 2019-06-10 ENCOUNTER — Telehealth: Payer: Self-pay | Admitting: Internal Medicine

## 2019-06-10 NOTE — Telephone Encounter (Signed)
Virtual Visit Pre-Appointment Phone Call  "(Name), I am calling you today to discuss your upcoming appointment. We are currently trying to limit exposure to the virus that causes COVID-19 by seeing patients at home rather than in the office."  1. "What is the BEST phone number to call the day of the visit?" - include this in appointment notes  2. Do you have or have access to (through a family member/friend) a smartphone with video capability that we can use for your visit?" a. If yes - list this number in appt notes as cell (if different from BEST phone #) and list the appointment type as a VIDEO visit in appointment notes b. If no - list the appointment type as a PHONE visit in appointment notes  3. Confirm consent - "In the setting of the current Covid19 crisis, you are scheduled for a (phone or video) visit with your provider on (date) at (time).  Just as we do with many in-office visits, in order for you to participate in this visit, we must obtain consent.  If you'd like, I can send this to your mychart (if signed up) or email for you to review.  Otherwise, I can obtain your verbal consent now.  All virtual visits are billed to your insurance company just like a normal visit would be.  By agreeing to a virtual visit, we'd like you to understand that the technology does not allow for your provider to perform an examination, and thus may limit your provider's ability to fully assess your condition. If your provider identifies any concerns that need to be evaluated in person, we will make arrangements to do so.  Finally, though the technology is pretty good, we cannot assure that it will always work on either your or our end, and in the setting of a video visit, we may have to convert it to a phone-only visit.  In either situation, we cannot ensure that we have a secure connection.  Are you willing to proceed?" STAFF: Did the patient verbally acknowledge consent to telehealth visit? Document  YES/NO here: yes  4. Advise patient to be prepared - "Two hours prior to your appointment, go ahead and check your blood pressure, pulse, oxygen saturation, and your weight (if you have the equipment to check those) and write them all down. When your visit starts, your provider will ask you for this information. If you have an Apple Watch or Kardia device, please plan to have heart rate information ready on the day of your appointment. Please have a pen and paper handy nearby the day of the visit as well."  5. Give patient instructions for MyChart download to smartphone OR Doximity/Doxy.me as below if video visit (depending on what platform provider is using)  6. Inform patient they will receive a phone call 15 minutes prior to their appointment time (may be from unknown caller ID) so they should be prepared to answer    TELEPHONE CALL NOTE  DERMOTT SARTWELL has been deemed a candidate for a follow-up tele-health visit to limit community exposure during the Covid-19 pandemic. I spoke with the patient via phone to ensure availability of phone/video source, confirm preferred email & phone number, and discuss instructions and expectations.  I reminded Paul Dunn to be prepared with any vital sign and/or heart rhythm information that could potentially be obtained via home monitoring, at the time of his visit. I reminded Paul Dunn to expect a phone call prior to  his visit.  Weston Anna 06/10/2019 11:54 AM   INSTRUCTIONS FOR DOWNLOADING THE MYCHART APP TO SMARTPHONE  - The patient must first make sure to have activated MyChart and know their login information - If Apple, go to [employer] and type in MyChart in the search bar and download the app. If Android, ask patient to go to [employer] and type in [place] in the search bar and download the app. The app is free but as with any other app downloads, their phone may require them to verify saved payment information or  Apple/Android password.  - The patient will need to then log into the app with their MyChart username and password, and select [facility] as their healthcare provider to link the account. When it is time for your visit, go to the MyChart app, find appointments, and click Begin Video Visit. Be sure to Select Allow for your device to access the Microphone and Camera for your visit. You will then be connected, and your provider will be with you shortly.  **If they have any issues connecting, or need assistance please contact MyChart service desk [phone])**  **If using a computer, in order to ensure the best quality for their visit they will need to use either of the following Internet Browsers: [employer], or Google Chrome**  IF USING DOXIMITY or DOXY.ME - The patient will receive a link just prior to their visit by text.     FULL LENGTH CONSENT FOR TELE-HEALTH VISIT   I hereby voluntarily request, consent and authorize [facility] and its employed or contracted physicians, physician assistants, nurse practitioners or other licensed health care professionals (the Practitioner), to provide me with telemedicine health care services (the Services") as deemed necessary by the treating Practitioner. I acknowledge and consent to receive the Services by the Practitioner via telemedicine. I understand that the telemedicine visit will involve communicating with the Practitioner through live audiovisual communication technology and the disclosure of certain medical information by electronic transmission. I acknowledge that I have been given the opportunity to request an in-person assessment or other available alternative prior to the telemedicine visit and am voluntarily participating in the telemedicine visit.  I understand that I have the right to withhold or withdraw my consent to the use of telemedicine in the course of my care at any time, without affecting my right to future care  or treatment, and that the Practitioner or I may terminate the telemedicine visit at any time. I understand that I have the right to inspect all information obtained and/or recorded in the course of the telemedicine visit and may receive copies of available information for a reasonable fee.  I understand that some of the potential risks of receiving the Services via telemedicine include:   Delay or interruption in medical evaluation due to technological equipment failure or disruption;  Information transmitted may not be sufficient (e.g. poor resolution of images) to allow for appropriate medical decision making by the Practitioner; and/or   In rare instances, security protocols could fail, causing a breach of personal health information.  Furthermore, I acknowledge that it is my responsibility to provide information about my medical history, conditions and care that is complete and accurate to the best of my ability. I acknowledge that Practitioner's advice, recommendations, and/or decision may be based on factors not within their control, such as incomplete or inaccurate data provided by me or distortions of diagnostic images or specimens that may result from electronic transmissions. I  understand that the practice of medicine is not an exact science and that Practitioner makes no warranties or guarantees regarding treatment outcomes. I acknowledge that I will receive a copy of this consent concurrently upon execution via email to the email address I last provided but may also request a printed copy by calling the office of [facility].    I understand that my insurance will be billed for this visit.   I have read or had this consent read to me.  I understand the contents of this consent, which adequately explains the benefits and risks of the Services being provided via telemedicine.   I have been provided ample opportunity to ask questions regarding this consent and the Services and have had  my questions answered to my satisfaction.  I give my informed consent for the services to be provided through the use of telemedicine in my medical care  By participating in this telemedicine visit I agree to the above.

## 2019-06-12 DIAGNOSIS — K210 Gastro-esophageal reflux disease with esophagitis: Secondary | ICD-10-CM | POA: Diagnosis not present

## 2019-06-12 DIAGNOSIS — E1142 Type 2 diabetes mellitus with diabetic polyneuropathy: Secondary | ICD-10-CM | POA: Diagnosis not present

## 2019-06-12 DIAGNOSIS — M1009 Idiopathic gout, multiple sites: Secondary | ICD-10-CM | POA: Diagnosis not present

## 2019-06-12 DIAGNOSIS — I10 Essential (primary) hypertension: Secondary | ICD-10-CM | POA: Diagnosis not present

## 2019-06-12 DIAGNOSIS — E782 Mixed hyperlipidemia: Secondary | ICD-10-CM | POA: Diagnosis not present

## 2019-06-14 ENCOUNTER — Encounter: Payer: Self-pay | Admitting: Internal Medicine

## 2019-06-14 ENCOUNTER — Telehealth (INDEPENDENT_AMBULATORY_CARE_PROVIDER_SITE_OTHER): Payer: Medicare Other | Admitting: Internal Medicine

## 2019-06-14 VITALS — BP 129/81 | HR 76 | Ht 73.0 in | Wt 194.0 lb

## 2019-06-14 DIAGNOSIS — R001 Bradycardia, unspecified: Secondary | ICD-10-CM | POA: Diagnosis not present

## 2019-06-14 DIAGNOSIS — I10 Essential (primary) hypertension: Secondary | ICD-10-CM | POA: Diagnosis not present

## 2019-06-14 DIAGNOSIS — I482 Chronic atrial fibrillation, unspecified: Secondary | ICD-10-CM | POA: Diagnosis not present

## 2019-06-14 NOTE — Progress Notes (Signed)
Electrophysiology TeleHealth Note   Due to national recommendations of social distancing due to [place] 19, an audio telehealth visit is felt to be most appropriate for this patient at this time.  Verbal consent was obtained by me for the telehealth visit today.  The patient does not have capability for a virtual visit.  A phone visit is therefore required today.   Date:  06/14/2019   ID:  Paul Dunn, DOB 1951-08-28, MRN [id number]  Location: patient's home  Provider location:  [facility]  Evaluation Performed: Follow-up visit  PCP:  Neale Burly, MD   Electrophysiologist:  Dr [name]  Chief Complaint:  Pacemaker follow up  History of Present Illness:    Paul Dunn is a 68 y.o. male who presents via telehealth conferencing today.  Since last being seen in our clinic, the patient reports doing very well.  Today, he denies symptoms of palpitations, chest pain, shortness of breath,  lower extremity edema, dizziness, presyncope, or syncope.  The patient is otherwise without complaint today.  The patient denies symptoms of fevers, chills, cough, or new SOB worrisome for COVID 19.  Past Medical History:  Diagnosis Date  . Asthma    "when I was a child"  . Atrial fibrillation ([place])    long standing persistent  . GERD (gastroesophageal reflux disease)   . Gouty arthritis   . H/O hiatal hernia   . High cholesterol   . History of blood transfusion ~ 1984  . History of bronchitis    "used to get it twice/year; last time was ~ 2 yr ago" (04/12/2012)  . HTN (hypertension)   . Kidney cysts    "never treated it"  . Peripheral vascular disease ([place])   . Pulmonary nodule   . 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 Type 2 diabetes mellitus ([place])     Past Surgical History:  Procedure Laterality Date  . CARDIAC CATHETERIZATION N/A 06/23/2016   Procedure: Left Heart Cath and Coronary Angiography;  Surgeon: Nelva Bush, MD;   Location: [facility] LAB;  Service: Cardiovascular;  Laterality: N/A;  . COLONOSCOPY N/A 05/22/2019   Procedure: COLONOSCOPY;  Surgeon: Rogene Houston, MD;  [facility];  Service: Endoscopy;  Laterality: N/A;  930  . ESOPHAGEAL DILATION  01/2012  . LACERATION REPAIR  ~ 1984   left forearm; "cut it w/a power saw"  . MELANOMA EXCISION  ~ 2011   left posterior shoulder  . PACEMAKER INSERTION  04/12/2012   SJM Accent DR RF pacemaker implanted by Dr [name] for tachy/brady syndrome  . PERMANENT PACEMAKER INSERTION N/A 04/12/2012   Procedure: PERMANENT PACEMAKER INSERTION;  Surgeon: Thompson Grayer, MD;  Location: [facility] CATH LAB;  Service: Cardiovascular;  Laterality: N/A;  . POLYPECTOMY  05/22/2019   Procedure: POLYPECTOMY;  Surgeon: Rogene Houston, MD;  [facility];  Service: Endoscopy;;  transverse, cecum,ascending,     Current Outpatient Medications  Medication Sig Dispense Refill  . allopurinol (ZYLOPRIM) 300 MG tablet Take 300 mg by mouth daily.    . Calcium Carb-Cholecalciferol (CALCIUM 500 + D) 500-200 MG-UNIT TABS Take 1 tablet by mouth daily.    . cholecalciferol (VITAMIN D) 1000 units tablet Take 2,000 Units by mouth every evening.     Marland Kitchen CINNAMON PO Take 1,000 mg by mouth 2 (two) times daily.     . clonazePAM (KLONOPIN) 1 MG tablet Take 1 mg by mouth  at bedtime.    Marland Kitchen diltiazem (DILACOR XR) 180 MG 24 hr capsule TAKE TWO CAPSULES EACH DAY (Patient taking differently: Take 360 mg by mouth daily. ) 180 capsule 1  . glipiZIDE (GLUCOTROL XL) 2.5 MG 24 hr tablet Take 2.5 mg by mouth daily.    Marland Kitchen lisinopril-hydrochlorothiazide (PRINZIDE,ZESTORETIC) 20-12.5 MG tablet Take 1 tablet by mouth daily.    . metFORMIN (GLUCOPHAGE) 500 MG tablet Take 1 tablet (500 mg total) by mouth daily with breakfast.    . Multiple Vitamins-Minerals (ICAPS) TABS Take 1 tablet by mouth 2 (two) times daily.    . Omega-3 Fatty Acids (FISH OIL) 1000 MG CAPS Take 1,000 mg by mouth 2 (two) times daily.  Does an extra 1,000mg  at bedtime    . pantoprazole (PROTONIX) 40 MG tablet Take 40 mg by mouth daily.    . rosuvastatin (CRESTOR) 5 MG tablet TAKE 1 TABLET EVERY MONDAY, WEDNESDAY, &FRIDAY (Patient taking differently: Take 5 mg by mouth every Monday, Wednesday, and Friday. ) 38 tablet 2  . warfarin (COUMADIN) 5 MG tablet Take 5-7.5 mg by mouth See admin instructions. Take 5 mg by mouth daily except for Tuesday and Thursday take 7.5 mg    . Zinc 50 MG TABS Take 50 mg by mouth daily.      No current facility-administered medications for this visit.     Allergies:   Shellfish allergy and Pseudoephedrine   Social History:  The patient  reports that he quit smoking about 27 years ago. His smoking use included cigarettes. He started smoking about 29 years ago. He has a 2.00 pack-year smoking history. He quit smokeless tobacco use about 22 years ago.  His smokeless tobacco use included chew. He reports that he does not drink alcohol or use drugs.   Family History:  The patient's family history includes Lung cancer in his father; Ulcers in his mother.   ROS:  Please see the history of present illness.   All other systems are personally reviewed and negative.    Exam:    Vital Signs:  BP 129/81   Pulse 76   Ht 6\' 1"  (1.854 m)   Wt 194 lb (88 kg)   BMI 25.60 kg/m   Well sounding and appearing, alert and conversant, regular work of breathing   Labs/Other Tests and Data Reviewed:    Recent Labs: No results found for requested labs within last 8760 hours.   Wt Readings from Last 3 Encounters:  06/14/19 194 lb (88 kg)  05/22/19 200 lb (90.7 kg)  04/13/18 198 lb 9.6 oz (90.1 kg)     Last device remote is reviewed from [place] PDF which reveals normal device function, no arrhythmias    ASSESSMENT & PLAN:    1.  Symptomatic sinus bradycardia Normal pacemaker function by recent remote See PaceArt report  2.  Longstanding persistent AF V rates controlled Continue Warfarin No bleeding  issues  3.  HTN Stable No change required today  4.   CAD Stable No change required today    Follow-up:  [place], me in 1 year    Patient Risk:  after full review of this patients clinical status, I feel that they are at moderate risk at this time.  Today, I have spent 15 minutes with the patient with telehealth technology discussing arrhythmia management .    Army Fossa, MD  06/14/2019 10:08 AM     [place] [address] [place] [address] [phone] (office) [phone] (  fax)

## 2019-06-17 ENCOUNTER — Ambulatory Visit (INDEPENDENT_AMBULATORY_CARE_PROVIDER_SITE_OTHER): Payer: Medicare Other | Admitting: *Deleted

## 2019-06-17 DIAGNOSIS — I4891 Unspecified atrial fibrillation: Secondary | ICD-10-CM

## 2019-06-17 DIAGNOSIS — I495 Sick sinus syndrome: Secondary | ICD-10-CM

## 2019-06-17 LAB — CUP PACEART REMOTE DEVICE CHECK
Battery Remaining Longevity: 97 mo
Battery Remaining Percentage: 73 %
Battery Voltage: 2.92 V
Brady Statistic RV Percent Paced: 88 %
Date Time Interrogation Session: 20201005071926
Implantable Lead Implant Date: 20130801
Implantable Lead Implant Date: 20130801
Implantable Lead Location: 753859
Implantable Lead Location: 753860
Implantable Lead Model: 1948
Implantable Pulse Generator Implant Date: 20130801
Lead Channel Impedance Value: 630 Ohm
Lead Channel Pacing Threshold Amplitude: 0.75 V
Lead Channel Pacing Threshold Pulse Width: 0.4 ms
Lead Channel Sensing Intrinsic Amplitude: 12 mV
Lead Channel Setting Pacing Amplitude: 2.5 V
Lead Channel Setting Pacing Pulse Width: 0.4 ms
Lead Channel Setting Sensing Sensitivity: 2 mV
Pulse Gen Model: 2210
Pulse Gen Serial Number: 7377947

## 2019-06-24 NOTE — Progress Notes (Signed)
Remote pacemaker transmission.   

## 2019-06-28 DIAGNOSIS — Z23 Encounter for immunization: Secondary | ICD-10-CM | POA: Diagnosis not present

## 2019-07-19 DIAGNOSIS — Z7901 Long term (current) use of anticoagulants: Secondary | ICD-10-CM | POA: Diagnosis not present

## 2019-09-16 ENCOUNTER — Ambulatory Visit (INDEPENDENT_AMBULATORY_CARE_PROVIDER_SITE_OTHER): Payer: Medicare Other | Admitting: *Deleted

## 2019-09-16 DIAGNOSIS — I4891 Unspecified atrial fibrillation: Secondary | ICD-10-CM

## 2019-09-16 LAB — CUP PACEART REMOTE DEVICE CHECK
Date Time Interrogation Session: 20210104061606
Implantable Lead Implant Date: 20130801
Implantable Lead Implant Date: 20130801
Implantable Lead Location: 753859
Implantable Lead Location: 753860
Implantable Lead Model: 1948
Implantable Pulse Generator Implant Date: 20130801
Pulse Gen Model: 2210
Pulse Gen Serial Number: 7377947

## 2019-09-18 DIAGNOSIS — Z0000 Encounter for general adult medical examination without abnormal findings: Secondary | ICD-10-CM | POA: Diagnosis not present

## 2019-09-18 DIAGNOSIS — E782 Mixed hyperlipidemia: Secondary | ICD-10-CM | POA: Diagnosis not present

## 2019-09-18 DIAGNOSIS — I10 Essential (primary) hypertension: Secondary | ICD-10-CM | POA: Diagnosis not present

## 2019-09-18 DIAGNOSIS — M1009 Idiopathic gout, multiple sites: Secondary | ICD-10-CM | POA: Diagnosis not present

## 2019-09-18 DIAGNOSIS — K2100 Gastro-esophageal reflux disease with esophagitis, without bleeding: Secondary | ICD-10-CM | POA: Diagnosis not present

## 2019-09-18 DIAGNOSIS — Z7901 Long term (current) use of anticoagulants: Secondary | ICD-10-CM | POA: Diagnosis not present

## 2019-09-18 DIAGNOSIS — Z1389 Encounter for screening for other disorder: Secondary | ICD-10-CM | POA: Diagnosis not present

## 2019-09-18 DIAGNOSIS — E1142 Type 2 diabetes mellitus with diabetic polyneuropathy: Secondary | ICD-10-CM | POA: Diagnosis not present

## 2019-10-25 DIAGNOSIS — Z7901 Long term (current) use of anticoagulants: Secondary | ICD-10-CM | POA: Diagnosis not present

## 2019-11-12 DIAGNOSIS — Z23 Encounter for immunization: Secondary | ICD-10-CM | POA: Diagnosis not present

## 2019-11-12 DIAGNOSIS — H81399 Other peripheral vertigo, unspecified ear: Secondary | ICD-10-CM | POA: Diagnosis not present

## 2019-11-19 DIAGNOSIS — Z85828 Personal history of other malignant neoplasm of skin: Secondary | ICD-10-CM | POA: Diagnosis not present

## 2019-11-19 DIAGNOSIS — D225 Melanocytic nevi of trunk: Secondary | ICD-10-CM | POA: Diagnosis not present

## 2019-11-19 DIAGNOSIS — Z08 Encounter for follow-up examination after completed treatment for malignant neoplasm: Secondary | ICD-10-CM | POA: Diagnosis not present

## 2019-11-19 DIAGNOSIS — L570 Actinic keratosis: Secondary | ICD-10-CM | POA: Diagnosis not present

## 2019-11-19 DIAGNOSIS — X32XXXD Exposure to sunlight, subsequent encounter: Secondary | ICD-10-CM | POA: Diagnosis not present

## 2019-11-19 DIAGNOSIS — D485 Neoplasm of uncertain behavior of skin: Secondary | ICD-10-CM | POA: Diagnosis not present

## 2019-11-19 DIAGNOSIS — Z1283 Encounter for screening for malignant neoplasm of skin: Secondary | ICD-10-CM | POA: Diagnosis not present

## 2019-12-10 DIAGNOSIS — Z23 Encounter for immunization: Secondary | ICD-10-CM | POA: Diagnosis not present

## 2019-12-16 ENCOUNTER — Ambulatory Visit (INDEPENDENT_AMBULATORY_CARE_PROVIDER_SITE_OTHER): Payer: Medicare Other | Admitting: *Deleted

## 2019-12-16 DIAGNOSIS — I4891 Unspecified atrial fibrillation: Secondary | ICD-10-CM | POA: Diagnosis not present

## 2019-12-16 LAB — CUP PACEART REMOTE DEVICE CHECK
Battery Remaining Longevity: 96 mo
Battery Remaining Percentage: 73 %
Battery Voltage: 2.92 V
Brady Statistic RV Percent Paced: 87 %
Date Time Interrogation Session: 20210405020007
Implantable Lead Implant Date: 20130801
Implantable Lead Implant Date: 20130801
Implantable Lead Location: 753859
Implantable Lead Location: 753860
Implantable Lead Model: 1948
Implantable Pulse Generator Implant Date: 20130801
Lead Channel Impedance Value: 590 Ohm
Lead Channel Pacing Threshold Amplitude: 0.75 V
Lead Channel Pacing Threshold Pulse Width: 0.4 ms
Lead Channel Sensing Intrinsic Amplitude: 12 mV
Lead Channel Setting Pacing Amplitude: 2.5 V
Lead Channel Setting Pacing Pulse Width: 0.4 ms
Lead Channel Setting Sensing Sensitivity: 2 mV
Pulse Gen Model: 2210
Pulse Gen Serial Number: 7377947

## 2019-12-17 NOTE — Progress Notes (Signed)
PPM Remote  

## 2019-12-25 DIAGNOSIS — E1142 Type 2 diabetes mellitus with diabetic polyneuropathy: Secondary | ICD-10-CM | POA: Diagnosis not present

## 2019-12-25 DIAGNOSIS — I4819 Other persistent atrial fibrillation: Secondary | ICD-10-CM | POA: Diagnosis not present

## 2019-12-25 DIAGNOSIS — H81399 Other peripheral vertigo, unspecified ear: Secondary | ICD-10-CM | POA: Diagnosis not present

## 2019-12-25 DIAGNOSIS — E782 Mixed hyperlipidemia: Secondary | ICD-10-CM | POA: Diagnosis not present

## 2019-12-25 DIAGNOSIS — M1009 Idiopathic gout, multiple sites: Secondary | ICD-10-CM | POA: Diagnosis not present

## 2019-12-25 DIAGNOSIS — I10 Essential (primary) hypertension: Secondary | ICD-10-CM | POA: Diagnosis not present

## 2019-12-25 DIAGNOSIS — K2100 Gastro-esophageal reflux disease with esophagitis, without bleeding: Secondary | ICD-10-CM | POA: Diagnosis not present

## 2019-12-31 DIAGNOSIS — L570 Actinic keratosis: Secondary | ICD-10-CM | POA: Diagnosis not present

## 2019-12-31 DIAGNOSIS — X32XXXD Exposure to sunlight, subsequent encounter: Secondary | ICD-10-CM | POA: Diagnosis not present

## 2019-12-31 DIAGNOSIS — L308 Other specified dermatitis: Secondary | ICD-10-CM | POA: Diagnosis not present

## 2020-01-17 DIAGNOSIS — I4819 Other persistent atrial fibrillation: Secondary | ICD-10-CM | POA: Diagnosis not present

## 2020-03-17 ENCOUNTER — Ambulatory Visit (INDEPENDENT_AMBULATORY_CARE_PROVIDER_SITE_OTHER): Payer: Medicare Other | Admitting: *Deleted

## 2020-03-17 DIAGNOSIS — I495 Sick sinus syndrome: Secondary | ICD-10-CM

## 2020-03-17 LAB — CUP PACEART REMOTE DEVICE CHECK
Battery Remaining Longevity: 96 mo
Battery Remaining Percentage: 73 %
Battery Voltage: 2.92 V
Brady Statistic RV Percent Paced: 88 %
Date Time Interrogation Session: 20210706025842
Implantable Lead Implant Date: 20130801
Implantable Lead Implant Date: 20130801
Implantable Lead Location: 753859
Implantable Lead Location: 753860
Implantable Lead Model: 1948
Implantable Pulse Generator Implant Date: 20130801
Lead Channel Impedance Value: 590 Ohm
Lead Channel Pacing Threshold Amplitude: 0.75 V
Lead Channel Pacing Threshold Pulse Width: 0.4 ms
Lead Channel Sensing Intrinsic Amplitude: 12 mV
Lead Channel Setting Pacing Amplitude: 2.5 V
Lead Channel Setting Pacing Pulse Width: 0.4 ms
Lead Channel Setting Sensing Sensitivity: 2 mV
Pulse Gen Model: 2210
Pulse Gen Serial Number: 7377947

## 2020-03-18 NOTE — Progress Notes (Signed)
Remote pacemaker transmission.   

## 2020-03-30 DIAGNOSIS — M1009 Idiopathic gout, multiple sites: Secondary | ICD-10-CM | POA: Diagnosis not present

## 2020-03-30 DIAGNOSIS — K2100 Gastro-esophageal reflux disease with esophagitis, without bleeding: Secondary | ICD-10-CM | POA: Diagnosis not present

## 2020-03-30 DIAGNOSIS — E782 Mixed hyperlipidemia: Secondary | ICD-10-CM | POA: Diagnosis not present

## 2020-03-30 DIAGNOSIS — I10 Essential (primary) hypertension: Secondary | ICD-10-CM | POA: Diagnosis not present

## 2020-03-30 DIAGNOSIS — M5431 Sciatica, right side: Secondary | ICD-10-CM | POA: Diagnosis not present

## 2020-03-30 DIAGNOSIS — E1142 Type 2 diabetes mellitus with diabetic polyneuropathy: Secondary | ICD-10-CM | POA: Diagnosis not present

## 2020-03-30 DIAGNOSIS — I4819 Other persistent atrial fibrillation: Secondary | ICD-10-CM | POA: Diagnosis not present

## 2020-05-11 DIAGNOSIS — L570 Actinic keratosis: Secondary | ICD-10-CM | POA: Diagnosis not present

## 2020-05-11 DIAGNOSIS — H04123 Dry eye syndrome of bilateral lacrimal glands: Secondary | ICD-10-CM | POA: Diagnosis not present

## 2020-05-11 DIAGNOSIS — X32XXXD Exposure to sunlight, subsequent encounter: Secondary | ICD-10-CM | POA: Diagnosis not present

## 2020-05-25 DIAGNOSIS — I4819 Other persistent atrial fibrillation: Secondary | ICD-10-CM | POA: Diagnosis not present

## 2020-06-04 DIAGNOSIS — E119 Type 2 diabetes mellitus without complications: Secondary | ICD-10-CM | POA: Diagnosis not present

## 2020-06-04 DIAGNOSIS — H40033 Anatomical narrow angle, bilateral: Secondary | ICD-10-CM | POA: Diagnosis not present

## 2020-06-12 ENCOUNTER — Ambulatory Visit (INDEPENDENT_AMBULATORY_CARE_PROVIDER_SITE_OTHER): Payer: Medicare Other | Admitting: Internal Medicine

## 2020-06-12 VITALS — BP 142/70 | HR 64 | Ht 73.0 in | Wt 201.0 lb

## 2020-06-12 DIAGNOSIS — I495 Sick sinus syndrome: Secondary | ICD-10-CM | POA: Diagnosis not present

## 2020-06-12 DIAGNOSIS — I4891 Unspecified atrial fibrillation: Secondary | ICD-10-CM | POA: Diagnosis not present

## 2020-06-12 DIAGNOSIS — R5383 Other fatigue: Secondary | ICD-10-CM

## 2020-06-12 DIAGNOSIS — R531 Weakness: Secondary | ICD-10-CM | POA: Diagnosis not present

## 2020-06-12 LAB — CUP PACEART INCLINIC DEVICE CHECK
Battery Remaining Longevity: 92 mo
Battery Voltage: 2.92 V
Brady Statistic RA Percent Paced: 0 %
Brady Statistic RV Percent Paced: 88 %
Date Time Interrogation Session: 20211001091341
Implantable Lead Implant Date: 20130801
Implantable Lead Implant Date: 20130801
Implantable Lead Location: 753859
Implantable Lead Location: 753860
Implantable Lead Model: 1948
Implantable Pulse Generator Implant Date: 20130801
Lead Channel Impedance Value: 587.5 Ohm
Lead Channel Pacing Threshold Amplitude: 0.75 V
Lead Channel Pacing Threshold Amplitude: 0.75 V
Lead Channel Pacing Threshold Pulse Width: 0.4 ms
Lead Channel Pacing Threshold Pulse Width: 0.4 ms
Lead Channel Sensing Intrinsic Amplitude: 11.7 mV
Lead Channel Sensing Intrinsic Amplitude: 3.6 mV
Lead Channel Setting Pacing Amplitude: 2.5 V
Lead Channel Setting Pacing Pulse Width: 0.4 ms
Lead Channel Setting Sensing Sensitivity: 2 mV
Pulse Gen Model: 2210
Pulse Gen Serial Number: 7377947

## 2020-06-12 NOTE — Progress Notes (Signed)
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  Since last being seen in our clinic, the patient reports doing very well.  His primary concern is with back issues.  He continues to play golf but is not hunting like he used to.  He has occasional fatigue/ weakness.  Overall, energy is good.  Today, he denies symptoms of palpitations, chest pain, shortness of breath,  lower extremity edema, dizziness, presyncope, or syncope.  The patient is otherwise without complaint today.   Past Medical History:  Diagnosis Date  . Asthma    "when I was a child"  . Atrial fibrillation ([place])    long standing persistent  . GERD (gastroesophageal reflux disease)   . Gouty arthritis   . H/O hiatal hernia   . High cholesterol   . History of blood transfusion ~ 1984  . History of bronchitis    "used to get it twice/year; last time was ~ 2 yr ago" (04/12/2012)  . HTN (hypertension)   . Kidney cysts    "never treated it"  . Peripheral vascular disease ([place])   . Pulmonary nodule   . 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 Type 2 diabetes mellitus ([place])    Past Surgical History:  Procedure Laterality Date  . CARDIAC CATHETERIZATION N/A 06/23/2016   Procedure: Left Heart Cath and Coronary Angiography;  Surgeon: Nelva Bush, MD;  Location: [facility] CV LAB;  Service: Cardiovascular;  Laterality: N/A;  . COLONOSCOPY N/A 05/22/2019   Procedure: COLONOSCOPY;  Surgeon: Rogene Houston, MD;  [facility];  Service: Endoscopy;  Laterality: N/A;  930  . ESOPHAGEAL DILATION  01/2012  . LACERATION REPAIR  ~ 1984   left forearm; "cut it w/a power saw"  . MELANOMA EXCISION  ~ 2011   left posterior shoulder  . PACEMAKER INSERTION  04/12/2012   SJM Accent DR RF pacemaker implanted by Dr [name] for tachy/brady syndrome  . PERMANENT PACEMAKER INSERTION N/A 04/12/2012     Procedure: PERMANENT PACEMAKER INSERTION;  Surgeon: Thompson Grayer, MD;  Location: [facility] CATH LAB;  Service: Cardiovascular;  Laterality: N/A;  . POLYPECTOMY  05/22/2019   Procedure: POLYPECTOMY;  Surgeon: Rogene Houston, MD;  [facility];  Service: Endoscopy;;  transverse, cecum,ascending,     ROS- all systems are reviewed and negative except as per HPI above  Current Outpatient Medications  Medication Sig Dispense Refill  . allopurinol (ZYLOPRIM) 300 MG tablet Take 300 mg by mouth daily.    . Calcium Carb-Cholecalciferol (CALCIUM 500 + D) 500-200 MG-UNIT TABS Take 1 tablet by mouth daily.    . cholecalciferol (VITAMIN D) 1000 units tablet Take 2,000 Units by mouth every evening.     Marland Kitchen CINNAMON PO Take 1,000 mg by mouth 2 (two) times daily.     . clonazePAM (KLONOPIN) 1 MG tablet Take 1 mg by mouth at bedtime.    Marland Kitchen diltiazem (DILACOR XR) 180 MG 24 hr capsule TAKE TWO CAPSULES EACH DAY (Patient taking differently: Take 360 mg by mouth daily. ) 180 capsule 1  . glipiZIDE (GLUCOTROL XL) 2.5 MG 24 hr tablet Take 2.5 mg by mouth daily.    Marland Kitchen lisinopril-hydrochlorothiazide (PRINZIDE,ZESTORETIC) 20-12.5 MG tablet Take 1 tablet by mouth daily.    . metFORMIN (GLUCOPHAGE) 500 MG tablet Take 1 tablet (500 mg total)  by mouth daily with breakfast.    . Multiple Vitamins-Minerals (ICAPS) TABS Take 1 tablet by mouth 2 (two) times daily.    . Omega-3 Fatty Acids (FISH OIL) 1000 MG CAPS Take 1,000 mg by mouth 2 (two) times daily. Does an extra 1,000mg  at bedtime    . pantoprazole (PROTONIX) 40 MG tablet Take 40 mg by mouth daily.    . rosuvastatin (CRESTOR) 5 MG tablet TAKE 1 TABLET EVERY MONDAY, WEDNESDAY, &FRIDAY (Patient taking differently: Take 5 mg by mouth every Monday, Wednesday, and Friday. ) 38 tablet 2  . warfarin (COUMADIN) 5 MG tablet Take 5-7.5 mg by mouth See admin instructions. Take 5 mg by mouth daily except for Tuesday and Thursday take 7.5 mg    . Zinc 50 MG TABS Take 50 mg by  mouth daily.      No current facility-administered medications for this visit.    Physical Exam: Vitals:   06/12/20 0902  BP: (!) 142/70  Pulse: 64  SpO2: 98%  Weight: 201 lb (91.2 kg)  Height: 6\' 1"  (1.854 m)     GEN- The patient is well appearing, alert and oriented x 3 today.   Head- normocephalic, atraumatic Eyes-  Sclera clear, conjunctiva pink Ears- hearing intact Oropharynx- clear Lungs-   normal work of breathing Chest- pacemaker pocket is well healed Heart- Regular rate and rhythm (paced) GI- soft  Extremities- no clubbing, cyanosis, or edema  Pacemaker interrogation- reviewed in detail today,  See PACEART report  ekg tracing ordered today is personally reviewed and shows afib, V paced  Assessment and Plan:  1. Symptomatic sinus bradycardia  Normal pacemaker function See Pace Art report No changes today he is not device dependant today  2. Permanent afib Rate controlled chads2vasc score is at least 4 He is on warfarin Update echo  3. CAD No ischemic symptoms No changes Cath 2017 reviewed He is on crestor Labs followed by PCP Overdue to see general cardiology  4. HTN Stable  No change required today Echo  Remotes are up to date Follow-up with general cardiology in 6 months Return to see me in a year  Risks, benefits and potential toxicities for medications prescribed and/or refilled reviewed with patient today.   Thompson Grayer MD, [facility] 06/12/2020 9:32 AM

## 2020-06-12 NOTE — Patient Instructions (Signed)
Medication Instructions:  Continue all current medications.  Labwork: none  Testing/Procedures:  Your physician has requested that you have an echocardiogram. Echocardiography is a painless test that uses sound waves to create images of your heart. It provides your doctor with information about the size and shape of your heart and how well your hearts chambers and valves are working. This procedure takes approximately one hour. There are no restrictions for this procedure.  Office will contact with results via phone or letter.    Follow-Up:  6 months - Andy  1 year - Dr. [name]   Any Other Special Instructions Will Be Listed Below (If Applicable).  If you need a refill on your cardiac medications before your next appointment, please call your pharmacy.

## 2020-06-15 ENCOUNTER — Ambulatory Visit (INDEPENDENT_AMBULATORY_CARE_PROVIDER_SITE_OTHER): Payer: Medicare Other

## 2020-06-15 DIAGNOSIS — I495 Sick sinus syndrome: Secondary | ICD-10-CM

## 2020-06-17 LAB — CUP PACEART REMOTE DEVICE CHECK
Battery Remaining Longevity: 96 mo
Battery Remaining Percentage: 73 %
Battery Voltage: 2.92 V
Brady Statistic RV Percent Paced: 87 %
Date Time Interrogation Session: 20211004104052
Implantable Lead Implant Date: 20130801
Implantable Lead Implant Date: 20130801
Implantable Lead Location: 753859
Implantable Lead Location: 753860
Implantable Lead Model: 1948
Implantable Pulse Generator Implant Date: 20130801
Lead Channel Impedance Value: 590 Ohm
Lead Channel Pacing Threshold Amplitude: 0.75 V
Lead Channel Pacing Threshold Pulse Width: 0.4 ms
Lead Channel Sensing Intrinsic Amplitude: 11.5 mV
Lead Channel Setting Pacing Amplitude: 2.5 V
Lead Channel Setting Pacing Pulse Width: 0.4 ms
Lead Channel Setting Sensing Sensitivity: 2 mV
Pulse Gen Model: 2210
Pulse Gen Serial Number: 7377947

## 2020-06-17 NOTE — Progress Notes (Signed)
Remote pacemaker transmission.   

## 2020-06-30 DIAGNOSIS — D225 Melanocytic nevi of trunk: Secondary | ICD-10-CM | POA: Diagnosis not present

## 2020-06-30 DIAGNOSIS — D485 Neoplasm of uncertain behavior of skin: Secondary | ICD-10-CM | POA: Diagnosis not present

## 2020-06-30 DIAGNOSIS — Z1283 Encounter for screening for malignant neoplasm of skin: Secondary | ICD-10-CM | POA: Diagnosis not present

## 2020-06-30 DIAGNOSIS — D2261 Melanocytic nevi of right upper limb, including shoulder: Secondary | ICD-10-CM | POA: Diagnosis not present

## 2020-06-30 DIAGNOSIS — D045 Carcinoma in situ of skin of trunk: Secondary | ICD-10-CM | POA: Diagnosis not present

## 2020-07-01 ENCOUNTER — Ambulatory Visit (INDEPENDENT_AMBULATORY_CARE_PROVIDER_SITE_OTHER): Payer: Medicare Other

## 2020-07-01 DIAGNOSIS — R531 Weakness: Secondary | ICD-10-CM

## 2020-07-01 DIAGNOSIS — R5383 Other fatigue: Secondary | ICD-10-CM

## 2020-07-01 DIAGNOSIS — I4891 Unspecified atrial fibrillation: Secondary | ICD-10-CM | POA: Diagnosis not present

## 2020-07-01 LAB — ECHOCARDIOGRAM COMPLETE
Area-P 1/2: 4.38 cm2
Calc EF: 56.5 %
MV M vel: 4.05 m/s
MV Peak grad: 65.6 mmHg
S' Lateral: 3.01 cm
Single Plane A2C EF: 56 %
Single Plane A4C EF: 57.2 %

## 2020-07-02 DIAGNOSIS — E782 Mixed hyperlipidemia: Secondary | ICD-10-CM | POA: Diagnosis not present

## 2020-07-02 DIAGNOSIS — M5431 Sciatica, right side: Secondary | ICD-10-CM | POA: Diagnosis not present

## 2020-07-02 DIAGNOSIS — Z7901 Long term (current) use of anticoagulants: Secondary | ICD-10-CM | POA: Diagnosis not present

## 2020-07-02 DIAGNOSIS — K2100 Gastro-esophageal reflux disease with esophagitis, without bleeding: Secondary | ICD-10-CM | POA: Diagnosis not present

## 2020-07-02 DIAGNOSIS — Z125 Encounter for screening for malignant neoplasm of prostate: Secondary | ICD-10-CM | POA: Diagnosis not present

## 2020-07-02 DIAGNOSIS — M1009 Idiopathic gout, multiple sites: Secondary | ICD-10-CM | POA: Diagnosis not present

## 2020-07-02 DIAGNOSIS — E1142 Type 2 diabetes mellitus with diabetic polyneuropathy: Secondary | ICD-10-CM | POA: Diagnosis not present

## 2020-07-02 DIAGNOSIS — I10 Essential (primary) hypertension: Secondary | ICD-10-CM | POA: Diagnosis not present

## 2020-07-02 DIAGNOSIS — I4819 Other persistent atrial fibrillation: Secondary | ICD-10-CM | POA: Diagnosis not present

## 2020-07-03 DIAGNOSIS — Z23 Encounter for immunization: Secondary | ICD-10-CM | POA: Diagnosis not present

## 2020-07-06 ENCOUNTER — Telehealth: Payer: Self-pay | Admitting: *Deleted

## 2020-07-06 NOTE — Telephone Encounter (Signed)
-----   Message from Thompson Grayer, MD sent at 07/05/2020 11:31 AM EDT ----- Results reviewed.  please inform pt of result. Ef remains normal

## 2020-07-06 NOTE — Telephone Encounter (Signed)
Laurine Blazer, LPN  45/84/8350 75:73 AM EDT Back to Top    Notified, copy to pcp.

## 2020-07-28 DIAGNOSIS — Z08 Encounter for follow-up examination after completed treatment for malignant neoplasm: Secondary | ICD-10-CM | POA: Diagnosis not present

## 2020-07-28 DIAGNOSIS — L820 Inflamed seborrheic keratosis: Secondary | ICD-10-CM | POA: Diagnosis not present

## 2020-07-28 DIAGNOSIS — Z85828 Personal history of other malignant neoplasm of skin: Secondary | ICD-10-CM | POA: Diagnosis not present

## 2020-07-28 DIAGNOSIS — X32XXXD Exposure to sunlight, subsequent encounter: Secondary | ICD-10-CM | POA: Diagnosis not present

## 2020-07-28 DIAGNOSIS — L570 Actinic keratosis: Secondary | ICD-10-CM | POA: Diagnosis not present

## 2020-08-31 DIAGNOSIS — Z23 Encounter for immunization: Secondary | ICD-10-CM | POA: Diagnosis not present

## 2020-08-31 DIAGNOSIS — Z7901 Long term (current) use of anticoagulants: Secondary | ICD-10-CM | POA: Diagnosis not present

## 2020-09-14 ENCOUNTER — Ambulatory Visit (INDEPENDENT_AMBULATORY_CARE_PROVIDER_SITE_OTHER): Payer: Medicare Other

## 2020-09-14 DIAGNOSIS — I495 Sick sinus syndrome: Secondary | ICD-10-CM | POA: Diagnosis not present

## 2020-09-15 LAB — CUP PACEART REMOTE DEVICE CHECK
Battery Remaining Longevity: 86 mo
Battery Remaining Percentage: 65 %
Battery Voltage: 2.9 V
Brady Statistic RV Percent Paced: 89 %
Date Time Interrogation Session: 20220103030336
Implantable Lead Implant Date: 20130801
Implantable Lead Implant Date: 20130801
Implantable Lead Location: 753859
Implantable Lead Location: 753860
Implantable Lead Model: 1948
Implantable Pulse Generator Implant Date: 20130801
Lead Channel Impedance Value: 590 Ohm
Lead Channel Pacing Threshold Amplitude: 0.75 V
Lead Channel Pacing Threshold Pulse Width: 0.4 ms
Lead Channel Sensing Intrinsic Amplitude: 12 mV
Lead Channel Setting Pacing Amplitude: 2.5 V
Lead Channel Setting Pacing Pulse Width: 0.4 ms
Lead Channel Setting Sensing Sensitivity: 2 mV
Pulse Gen Model: 2210
Pulse Gen Serial Number: 7377947

## 2020-09-25 NOTE — Progress Notes (Signed)
Remote pacemaker transmission.   

## 2020-10-06 DIAGNOSIS — E782 Mixed hyperlipidemia: Secondary | ICD-10-CM | POA: Diagnosis not present

## 2020-10-06 DIAGNOSIS — I10 Essential (primary) hypertension: Secondary | ICD-10-CM | POA: Diagnosis not present

## 2020-10-06 DIAGNOSIS — I4819 Other persistent atrial fibrillation: Secondary | ICD-10-CM | POA: Diagnosis not present

## 2020-10-06 DIAGNOSIS — Z0000 Encounter for general adult medical examination without abnormal findings: Secondary | ICD-10-CM | POA: Diagnosis not present

## 2020-10-06 DIAGNOSIS — Z1331 Encounter for screening for depression: Secondary | ICD-10-CM | POA: Diagnosis not present

## 2020-10-06 DIAGNOSIS — M1009 Idiopathic gout, multiple sites: Secondary | ICD-10-CM | POA: Diagnosis not present

## 2020-10-06 DIAGNOSIS — E1142 Type 2 diabetes mellitus with diabetic polyneuropathy: Secondary | ICD-10-CM | POA: Diagnosis not present

## 2020-10-06 DIAGNOSIS — Z7901 Long term (current) use of anticoagulants: Secondary | ICD-10-CM | POA: Diagnosis not present

## 2020-10-06 DIAGNOSIS — K2100 Gastro-esophageal reflux disease with esophagitis, without bleeding: Secondary | ICD-10-CM | POA: Diagnosis not present

## 2020-10-19 DIAGNOSIS — K2100 Gastro-esophageal reflux disease with esophagitis, without bleeding: Secondary | ICD-10-CM | POA: Diagnosis not present

## 2020-10-19 DIAGNOSIS — Z6828 Body mass index (BMI) 28.0-28.9, adult: Secondary | ICD-10-CM | POA: Diagnosis not present

## 2020-10-20 ENCOUNTER — Encounter (INDEPENDENT_AMBULATORY_CARE_PROVIDER_SITE_OTHER): Payer: Self-pay | Admitting: *Deleted

## 2020-11-09 ENCOUNTER — Encounter (INDEPENDENT_AMBULATORY_CARE_PROVIDER_SITE_OTHER): Payer: Self-pay

## 2020-11-09 ENCOUNTER — Other Ambulatory Visit: Payer: Self-pay

## 2020-11-09 ENCOUNTER — Ambulatory Visit (INDEPENDENT_AMBULATORY_CARE_PROVIDER_SITE_OTHER): Payer: Medicare Other | Admitting: Gastroenterology

## 2020-11-09 ENCOUNTER — Encounter (INDEPENDENT_AMBULATORY_CARE_PROVIDER_SITE_OTHER): Payer: Self-pay | Admitting: Gastroenterology

## 2020-11-09 DIAGNOSIS — Z85060 Personal history of malignant carcinoid tumor of small intestine: Secondary | ICD-10-CM | POA: Diagnosis not present

## 2020-11-09 DIAGNOSIS — K219 Gastro-esophageal reflux disease without esophagitis: Secondary | ICD-10-CM | POA: Diagnosis not present

## 2020-11-09 NOTE — Progress Notes (Signed)
Paul Dunn, M.D. Gastroenterology & Hepatology [facility] [address] [place], [place] [address]  Primary Care Physician: Neale Burly, MD [place] [address]  I will communicate my assessment and recommendations to the referring MD via EMR.  Problems: 1. Heartburn 2. History of H. Pylori 3. History of duodenal carcinoid  History of Present Illness: Paul Dunn is a 70 y.o. 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  The patient reports that his wife told him that close to a year he has been presenting episodes of gurgling when he is laying down. He reports that he was presenting frequenting burping and belching. He states also having intermittent episodes of heartburn at night but worse during the night.  40s, the patient has been taking pantoprazole 40 mg every day, usually after he has his breakfast.  Notably, the patient had her IgG for H. pylori on October 19, 2020 which came back positive.  Patient states he took triple regimen with amoxicillin, omeprazole and possibly clarithromycin. He took the medication compliantly. He reports that after finishing the medications his symptoms have much more improved but are still present in mild intensity.  The patient denies having any odynophagia, dysphagia, vomiting, fever, chills, hematochezia, melena, hematemesis, abdominal distention, abdominal pain, diarrhea, jaundice, pruritus or weight loss.  Last EGD: EUS 2011 Last Colonoscopy: 2020 -  Sigmoid polyps, hyperplastic  Notably, the patient brings the reports of previous EGDs performed back in 2010 which showed presence of a hiatal hernia, there was presence also a sessile polyp in the duodenal bulb close to 1 cm in  size.  Due to this, the patient was referred to [facility] for an endoscopic ultrasound, which showed moderate nodular gastritis in the whole stomach also presence of a duodenal carcinoid small in size, will recommend to have repeat US in 3 years but he did not perform the study.  Past Medical History: Past Medical History:  Diagnosis Date  . Asthma    "when I was a child"  . Atrial fibrillation ([place])    long standing persistent  . GERD (gastroesophageal reflux disease)   . Gouty arthritis   . H/O hiatal hernia   . High cholesterol   . History of blood transfusion ~ 1984  . History of bronchitis    "used to get it twice/year; last time was ~ 2 yr ago" (04/12/2012)  . HTN (hypertension)   . Kidney cysts    "never treated it"  . Peripheral vascular disease ([place])   . Pulmonary nodule   . Tachycardia-bradycardia syndrome [facility])    s/p PPM implant by Dr [name] 04/12/12  . Type 2 diabetes mellitus ([place])     Past Surgical History: Past Surgical History:  Procedure Laterality Date  . CARDIAC CATHETERIZATION N/A 06/23/2016   Procedure: Left Heart Cath and Coronary Angiography;  Surgeon: Nelva Bush, MD;  Location: [facility];  Service: Cardiovascular;  Laterality: N/A;  . COLONOSCOPY N/A 05/22/2019   Procedure: COLONOSCOPY;  Surgeon: Rogene Houston, MD;  [facility]: [facility]O [facility];  Service: Endoscopy;  Laterality: N/A;  930  . ESOPHAGEAL DILATION  01/2012  . LACERATION REPAIR  ~ 1984   left forearm; "cut it w/a power saw"  . MELANOMA EXCISION  ~ 2011   left posterior shoulder  . PACEMAKER INSERTION  04/12/2012   SJM Accent DR RF pacemaker implanted by Dr [name] for tachy/brady syndrome  . PERMANENT PACEMAKER INSERTION N/A 04/12/2012   Procedure: PERMANENT PACEMAKER INSERTION;  Surgeon: Thompson Grayer, MD;  Location: [facility] CATH LAB;  Service: Cardiovascular;  Laterality: N/A;  . POLYPECTOMY  05/22/2019   Procedure: POLYPECTOMY;  Surgeon: Rogene Houston, MD;  Location: [facility]  ENDO SUITE;  Service: Endoscopy;;  transverse, cecum,ascending,     Family History: Family History  Problem Relation Age of Onset  . Lung cancer Father   . Ulcers Mother     Social History: Social History   Tobacco Use  Smoking Status Former Smoker  . Packs/day: 1.00  . Years: 2.00  . Pack years: 2.00  . Types: Cigarettes  . Start date: 09/12/1989  . Quit date: 09/13/1991  . Years since quitting: 29.1  Smokeless Tobacco Former Systems developer  . Types: Chew  . Quit date: 09/12/1996   Social History   Substance and Sexual Activity  Alcohol Use No  . Alcohol/week: 0.0 standard drinks   Social History   Substance and Sexual Activity  Drug Use No    Allergies: Allergies  Allergen Reactions  . Shellfish Allergy Nausea And Vomiting  . Pseudoephedrine          Medications: Current Outpatient Medications  Medication Sig Dispense Refill  . allopurinol (ZYLOPRIM) 300 MG tablet Take 300 mg by mouth daily.    . Calcium Carb-Cholecalciferol 500-200 MG-UNIT TABS Take 1 tablet by mouth daily.    . cholecalciferol (VITAMIN D) 1000 units tablet Take 2,000 Units by mouth every evening.     Marland Kitchen CINNAMON PO Take 1,000 mg by mouth daily.    . clonazePAM (KLONOPIN) 1 MG tablet Take 1 mg by mouth at bedtime.    Marland Kitchen diltiazem (DILACOR XR) 180 MG 24 hr capsule TAKE TWO CAPSULES EACH DAY (Patient taking differently: Take 360 mg by mouth daily.) 180 capsule 1  . glipiZIDE (GLUCOTROL XL) 2.5 MG 24 hr tablet Take 2.5 mg by mouth daily.    Marland Kitchen lisinopril-hydrochlorothiazide (PRINZIDE,ZESTORETIC) 20-12.5 MG tablet Take 1 tablet by mouth daily.    . metFORMIN (GLUCOPHAGE) 500 MG tablet Take 1 tablet (500 mg total) by mouth daily with breakfast.    . Multiple Vitamins-Minerals (ICAPS) TABS Take 1 tablet by mouth 2 (two) times daily.    . Omega-3 Fatty Acids (FISH OIL) 1000 MG CAPS Take 1,000 mg by mouth 3 (three) times daily. Does an extra 1,000mg  at bedtime    . pantoprazole (PROTONIX) 40 MG tablet Take 40 mg  by mouth daily.    . rosuvastatin (CRESTOR) 5 MG tablet TAKE 1 TABLET EVERY MONDAY, WEDNESDAY, &FRIDAY (Patient taking differently: Take 5 mg by mouth every Monday, Wednesday, and Friday.) 38 tablet 2  . warfarin (COUMADIN) 5 MG tablet Take 5-7.5 mg by mouth See admin instructions. Take 5 mg by mouth daily except for Thursday take 7.5 mg    . Zinc 50 MG TABS Take 50 mg by mouth daily.      No current facility-administered medications for this visit.    Review of Systems: GENERAL: negative for malaise, night sweats HEENT: No changes in hearing or vision, no nose bleeds or other nasal problems. NECK: Negative for lumps, goiter, pain and significant neck swelling RESPIRATORY: Negative for cough, wheezing CARDIOVASCULAR: Negative for chest pain, leg swelling, palpitations, orthopnea GI: SEE HPI MUSCULOSKELETAL: Negative for joint pain or swelling, back pain, and muscle pain. SKIN: Negative for lesions, rash PSYCH: Negative  for sleep disturbance, mood disorder and recent psychosocial stressors. HEMATOLOGY Negative for prolonged bleeding, bruising easily, and swollen nodes. ENDOCRINE: Negative for cold or heat intolerance, polyuria, polydipsia and goiter. NEURO: negative for tremor, gait imbalance, syncope and seizures. The remainder of the review of systems is noncontributory.   Physical Exam: BP 135/84 (BP Location: Left Arm, Patient Position: Sitting, Cuff Size: Large)   Pulse 76   Temp 98 F (36.7 C) (Oral)   Ht 6\' 1"  (1.854 m)   Wt 198 lb (89.8 kg)   BMI 26.12 kg/m  GENERAL: The patient is AO x3, in no acute distress. HEENT: Head is normocephalic and atraumatic. EOMI are intact. Mouth is well hydrated and without lesions. NECK: Supple. No masses LUNGS: Clear to auscultation. No presence of rhonchi/wheezing/rales. Adequate chest expansion HEART: RRR, normal s1 and s2. ABDOMEN: Soft, nontender, no guarding, no peritoneal signs, and nondistended. BS +. No masses. EXTREMITIES:  Without any cyanosis, clubbing, rash, lesions or edema. NEUROLOGIC: AOx3, no focal motor deficit. SKIN: no jaundice, no rashes  Imaging/Labs: as above  I personally reviewed and interpreted the available labs, imaging and endoscopic files.  Impression and Plan: DONDI AIME is a 70 y.o. 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  Patient has presented symptoms that are suggestive of GERD but he has not presented complete improvement 3 symptoms after starting Protonix.  I advised the patient that the best way to take it is 30 minutes before his breakfast which he will implement.  We will assess if there is any symptom improve after these changes done.  However given his age and new onset of symptoms we will proceed with an EGD.  This will also help [place] should they the duodenal carcinoid found in 2011.  Patient understood and agreed.  - Continue pantoprazole 40 mg qday - Schedule EGD - will reach Dr. Sherrie Sport to hold coumadin before procedure - Explained presumed etiology of reflux symptoms. Instruction provided in the use of antireflux medication - patient should take medication in the morning 30-45 minutes before eating breakfast. Discussed avoidance of eating within 2 hours of lying down to sleep and benefit of blocks to elevate head of bed.  All questions were answered.      Harvel Quale, MD Gastroenterology and Hepatology [facility]

## 2020-11-09 NOTE — Patient Instructions (Addendum)
Continue pantoprazole 40 mg qday Schedule EGD - will reach Dr. Sherrie Sport to hold coumadin before procedure  Explained presumed etiology of reflux symptoms. Instruction provided in the use of antireflux medication - patient should take medication in the morning 30-45 minutes before eating breakfast. Discussed avoidance of eating within 2 hours of lying down to sleep and benefit of blocks to elevate head of bed.

## 2020-11-10 ENCOUNTER — Telehealth (INDEPENDENT_AMBULATORY_CARE_PROVIDER_SITE_OTHER): Payer: Self-pay | Admitting: Gastroenterology

## 2020-11-10 NOTE — Telephone Encounter (Signed)
I received the results of the endoscopic ultrasound performed on 06/04/2009 which showed Schatzki's ring which was dilated with a Savary dilator up to 16 mm, presence of an 8 mm nodule in the apex of the duodenal bulb for which multiple biopsies were taken.  Ultrasound visualization of the lesion held measuring the lesion which was localized between the mucosa and submucosa.  It measured approximately 7 x 3 mm.  Paul Peppers, MD Gastroenterology and Hepatology [facility]

## 2020-11-13 ENCOUNTER — Encounter (INDEPENDENT_AMBULATORY_CARE_PROVIDER_SITE_OTHER): Payer: Self-pay

## 2020-11-18 NOTE — Pre-Procedure Instructions (Signed)
Lovelace, Michelene Gardener, CMA  Encarnacion Chu, RN Yes Dr Sherrie Sport said it was ok to hold coumadin 5 days but would need lovenox bridge        Previous Messages   ----- Message -----  From: Encarnacion Chu, RN  Sent: 11/18/2020 10:21 AM EST  To: [name], CMA, *  Subject: coumadin.                     Hey Paul Dunn. I see in Dr Colman Cater last note that he was going to reach out to Dr Forestine Na to confirm that it was okay to stop Paul Dunn' coumadin for procedure but I did not see a response from Dr Sherrie Sport confirming this. Did your guys receive that response? Just want to make sure we follow those instructions correctly. Thank you.

## 2020-11-18 NOTE — Patient Instructions (Signed)
KAELYN NAUTA  11/18/2020     @PREFPERIOPPHARMACY @   Your procedure is scheduled on  11/20/2020.   Report to [facility] at  0930  A.M.   Call this number if you have problems the morning of surgery:  [phone]   Remember:  Follow the diet instructions given to you by the office.                      Take these medicines the morning of surgery with A SIP OF WATER  Allopurinol, diltiazem, protonix.  DO NOT take any medications for diabetes the morning of your procedure.  If your glucose is 70 or below the morning of your procedure, drink 1/2 cup of clear juice and recheck your glucose in 15 minutes. If it is still 70 or below, call [phone] for instructions.  If your glucose is 300 or above the morning of your procedure, call [phone] for instructions.     Please brush your teeth.  Do not wear jewelry, make-up or nail polish.  Do not wear lotions, powders, or perfumes, or deodorant.  Do not shave 48 hours prior to surgery.  Men may shave face and neck.  Do not bring valuables to the hospital.  [facility] is not responsible for any belongings or valuables.  Contacts, dentures or bridgework may not be worn into surgery.  Leave your suitcase in the car.  After surgery it may be brought to your room.  For patients admitted to the hospital, discharge time will be determined by your treatment team.  Patients discharged the day of surgery will not be allowed to drive home and must have someone with them for 24 hours.    Special instructions:  DO NOT smoke tobacco or vape the morning of your procedure.  Please read over the following fact sheets that you were given. Anesthesia Post-op Instructions and Care and Recovery After Surgery       Upper Endoscopy, Adult, Care After This sheet gives you information about how to care for yourself after your procedure. Your health care provider may also give you more specific instructions. If you have problems or  questions, contact your health care provider. What can I expect after the procedure? After the procedure, it is common to have:  A sore throat.  Mild stomach pain or discomfort.  Bloating.  Nausea. Follow these instructions at home:  Follow instructions from your health care provider about what to eat or drink after your procedure.  Return to your normal activities as told by your health care provider. Ask your health care provider what activities are safe for you.  Take over-the-counter and prescription medicines only as told by your health care provider.  If you were given a sedative during the procedure, it can affect you for several hours. Do not drive or operate machinery until your health care provider says that it is safe.  Keep all follow-up visits as told by your health care provider. This is important.   Contact a health care provider if you have:  A sore throat that lasts longer than one day.  Trouble swallowing. Get help right away if:  You vomit blood or your vomit looks like coffee grounds.  You have: ? A fever. ? Bloody, black, or tarry stools. ? A severe sore throat or you cannot swallow. ? Difficulty breathing. ? Severe pain in your chest or abdomen. Summary  After the procedure, it is common  to have a sore throat, mild stomach discomfort, bloating, and nausea.  If you were given a sedative during the procedure, it can affect you for several hours. Do not drive or operate machinery until your health care provider says that it is safe.  Follow instructions from your health care provider about what to eat or drink after your procedure.  Return to your normal activities as told by your health care provider. This information is not intended to replace advice given to you by your health care provider. Make sure you discuss any questions you have with your health care provider. Document Revised: 08/27/2019 Document Reviewed: 01/29/2018 Elsevier Patient  Education  2021 [place] After This sheet gives you information about how to care for yourself after your procedure. Your health care provider may also give you more specific instructions. If you have problems or questions, contact your health care provider. What can I expect after the procedure? After the procedure, it is common to have:  Tiredness.  Forgetfulness about what happened after the procedure.  Impaired judgment for important decisions.  Nausea or vomiting.  Some difficulty with balance. Follow these instructions at home: For the time period you were told by your health care provider:  Rest as needed.  Do not participate in activities where you could fall or become injured.  Do not drive or use machinery.  Do not drink alcohol.  Do not take sleeping pills or medicines that cause drowsiness.  Do not make important decisions or sign legal documents.  Do not take care of children on your own.      Eating and drinking  Follow the diet that is recommended by your health care provider.  Drink enough fluid to keep your urine pale yellow.  If you vomit: ? Drink water, juice, or soup when you can drink without vomiting. ? Make sure you have little or no nausea before eating solid foods. General instructions  Have a responsible adult stay with you for the time you are told. It is important to have someone help care for you until you are awake and alert.  Take over-the-counter and prescription medicines only as told by your health care provider.  If you have sleep apnea, surgery and certain medicines can increase your risk for breathing problems. Follow instructions from your health care provider about wearing your sleep device: ? Anytime you are sleeping, including during daytime naps. ? While taking prescription pain medicines, sleeping medicines, or medicines that make you drowsy.  Avoid smoking.  Keep all follow-up  visits as told by your health care provider. This is important. Contact a health care provider if:  You keep feeling nauseous or you keep vomiting.  You feel light-headed.  You are still sleepy or having trouble with balance after 24 hours.  You develop a rash.  You have a fever.  You have redness or swelling around the IV site. Get help right away if:  You have trouble breathing.  You have new-onset confusion at home. Summary  For several hours after your procedure, you may feel tired. You may also be forgetful and have poor judgment.  Have a responsible adult stay with you for the time you are told. It is important to have someone help care for you until you are awake and alert.  Rest as told. Do not drive or operate machinery. Do not drink alcohol or take sleeping pills.  Get help right away if you have trouble breathing, or  if you suddenly become confused. This information is not intended to replace advice given to you by your health care provider. Make sure you discuss any questions you have with your health care provider. Document Revised: 05/14/2020 Document Reviewed: 08/01/2019 Elsevier Patient Education  2021 [employer].

## 2020-11-19 ENCOUNTER — Encounter (HOSPITAL_COMMUNITY): Payer: Self-pay

## 2020-11-19 ENCOUNTER — Encounter (HOSPITAL_COMMUNITY)
Admission: RE | Admit: 2020-11-19 | Discharge: 2020-11-19 | Disposition: A | Discharge status: Home / Self Care | Payer: Medicare Other | Source: Ambulatory Visit | Attending: Gastroenterology | Admitting: Gastroenterology

## 2020-11-19 ENCOUNTER — Other Ambulatory Visit: Payer: Self-pay

## 2020-11-19 ENCOUNTER — Encounter (HOSPITAL_COMMUNITY): Payer: Self-pay | Admitting: Anesthesiology

## 2020-11-19 ENCOUNTER — Other Ambulatory Visit (HOSPITAL_COMMUNITY)
Admission: RE | Admit: 2020-11-19 | Discharge: 2020-11-19 | Disposition: A | Discharge status: Home / Self Care | Payer: Medicare Other | Source: Ambulatory Visit | Attending: Gastroenterology | Admitting: Gastroenterology

## 2020-11-19 DIAGNOSIS — Z20822 Contact with and (suspected) exposure to covid-19: Secondary | ICD-10-CM | POA: Diagnosis not present

## 2020-11-19 DIAGNOSIS — Z01812 Encounter for preprocedural laboratory examination: Secondary | ICD-10-CM | POA: Diagnosis not present

## 2020-11-19 HISTORY — DX: Unspecified macular degeneration: H35.30

## 2020-11-19 HISTORY — DX: Other intervertebral disc degeneration, lumbar region without mention of lumbar back pain or lower extremity pain: M51.369

## 2020-11-19 HISTORY — DX: Other intervertebral disc displacement, lumbar region: M51.26

## 2020-11-19 HISTORY — DX: Other intervertebral disc degeneration, lumbar region: M51.36

## 2020-11-19 HISTORY — DX: Unspecified malignant neoplasm of skin, unspecified: C44.90

## 2020-11-19 LAB — BASIC METABOLIC PANEL
Anion gap: 10 (ref 5–15)
BUN: 15 mg/dL (ref 8–23)
CO2: 26 mmol/L (ref 22–32)
Calcium: 9.2 mg/dL (ref 8.9–10.3)
Chloride: 102 mmol/L (ref 98–111)
Creatinine, Ser: 0.88 mg/dL (ref 0.61–1.24)
GFR, Estimated: >60 mL/min (ref >60)
Glucose, Bld: 201 mg/dL — ABNORMAL HIGH (ref 70–99)
Potassium: 4.2 mmol/L (ref 3.5–5.1)
Sodium: 138 mmol/L (ref 135–145)

## 2020-11-19 LAB — SARS CORONAVIRUS 2 (TAT 6-24 HRS): SARS Coronavirus 2: NEGATIVE

## 2020-11-20 ENCOUNTER — Encounter (HOSPITAL_COMMUNITY): Payer: Self-pay | Admitting: Gastroenterology

## 2020-11-20 ENCOUNTER — Ambulatory Visit (HOSPITAL_COMMUNITY)
Admission: RE | Admit: 2020-11-20 | Discharge: 2020-11-20 | Disposition: A | Discharge status: Home / Self Care | Payer: Medicare Other | Attending: Gastroenterology | Admitting: Gastroenterology

## 2020-11-20 ENCOUNTER — Other Ambulatory Visit: Payer: Self-pay

## 2020-11-20 ENCOUNTER — Encounter (HOSPITAL_COMMUNITY)
Admission: RE | Disposition: A | Discharge status: Home / Self Care | Payer: Self-pay | Source: Home / Self Care | Attending: Gastroenterology

## 2020-11-20 DIAGNOSIS — R12 Heartburn: Secondary | ICD-10-CM | POA: Insufficient documentation

## 2020-11-20 DIAGNOSIS — Z85060 Personal history of malignant carcinoid tumor of small intestine: Secondary | ICD-10-CM | POA: Diagnosis not present

## 2020-11-20 DIAGNOSIS — Z5309 Procedure and treatment not carried out because of other contraindication: Secondary | ICD-10-CM | POA: Diagnosis not present

## 2020-11-20 DIAGNOSIS — Z79899 Other long term (current) drug therapy: Secondary | ICD-10-CM | POA: Insufficient documentation

## 2020-11-20 DIAGNOSIS — Z8619 Personal history of other infectious and parasitic diseases: Secondary | ICD-10-CM | POA: Insufficient documentation

## 2020-11-20 LAB — GLUCOSE, CAPILLARY: Glucose-Capillary: 145 mg/dL — ABNORMAL HIGH (ref 70–99)

## 2020-11-20 SURGERY — ESOPHAGOGASTRODUODENOSCOPY (EGD) WITH PROPOFOL
Anesthesia: Monitor Anesthesia Care

## 2020-11-20 MED ORDER — LACTATED RINGERS IV SOLN: INTRAVENOUS | Status: DC

## 2020-11-20 MED ORDER — LIDOCAINE VISCOUS HCL 2 % MT SOLN: 15.0000 mL | Freq: Once | OROMUCOSAL | Status: DC

## 2020-11-20 NOTE — Progress Notes (Signed)
I called GI office and spoke with Leanne. I explained that Paul Dunn procedure had been cancelled and he was told that the GI would contact him to get him rescheduled.

## 2020-11-20 NOTE — Progress Notes (Signed)
Patient reported taking Lovenox injection this am when asked about daily medications. Nurse reported that Lovenox was taken this am to Dr. Modesta Messing and Dr. Jenetta Downer. Both Doctors spoke with patient and patient decided to reschedule appointment for another day. Patient will call PCP for instructions on Lovenox and Coumadin. Patient was told the GI office will call him to reschedule procedure.

## 2020-11-27 ENCOUNTER — Other Ambulatory Visit (INDEPENDENT_AMBULATORY_CARE_PROVIDER_SITE_OTHER): Payer: Self-pay

## 2020-11-27 DIAGNOSIS — K219 Gastro-esophageal reflux disease without esophagitis: Secondary | ICD-10-CM

## 2020-12-03 DIAGNOSIS — Z7901 Long term (current) use of anticoagulants: Secondary | ICD-10-CM | POA: Diagnosis not present

## 2020-12-10 NOTE — Progress Notes (Signed)
Cardiology Office Note:    Date:  12/11/2020   ID:  Paul Dunn, DOB November 08, 1950, MRN [id number]  PCP:  Paul Burly, MD   [place]  Cardiologist:  No primary care provider on file.  Electrophysiologist:  Thompson Grayer, MD    :[id number]}   Referring MD: Paul Burly, MD   Chief Complaint:  Follow-up (Atrial fibrillation, HLD, HTN, tachybradycardia syndrome)    Patient Profile:    Paul Dunn is a 70 y.o. 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 Rayann Dunn August 2013, DM2.  Last seen by Dr. Rayann Dunn 06/12/2020.  He reported doing very well.  He was having a lot of back issues.  Occasional fatigue and weakness.  Denied any symptoms of palpitations, chest pain, shortness of breath, edema, orthostatic symptoms/syncopal or near syncopal symptoms.  He had normal pacemaker function.  His atrial fibrillation was rate controlled with CHA2DS2-VASc score of at least 4.  He was continuing Coumadin.  Echocardiogram was ordered.  Had no ischemic changes and no changes to CAD therapy  Prior CV studies:   Echocardiogram 06/12/2020   IMPRESSIONS  1. Left ventricular ejection fraction, by estimation, is 50 to 55%. The  left ventricle has low normal function. The left ventricle demonstrates  regional wall motion abnormalities (see scoring diagram/findings for  description). Left ventricular diastolic  parameters are indeterminate.  2. Right ventricular systolic function is normal. The right ventricular  size is normal.  3. Left atrial size was severely dilated.  4. Right atrial size was mildly dilated.  5. The mitral valve is normal in structure. Mild mitral valve  regurgitation. No evidence of mitral stenosis.  6. The aortic valve is tricuspid. There is mild calcification of the  aortic valve. There is mild thickening of the aortic valve. Aortic valve  regurgitation is not  visualized. No aortic stenosis is present.  7. The inferior vena cava is normal in size with greater than 50%  respiratory variability, suggesting right atrial pressure of 3 mmHg.   Comparison(s): Echocardiogram done 06/01/16 showed an EF of 60-65%.    Nuclear stress test 06/08/2016  Study Result  Narrative & Impression   Defect 1: There is a medium defect of moderate severity present in the basal inferoseptal, mid inferoseptal, mid inferior and apical inferior location.  Findings consistent with prior myocardial infarction. There may be a minimal degree of peri-infarct ischemia.  This is an intermediate risk study.  Nuclear stress EF: 52%    Left Heart Cath and Coronary Angiography  06/23/2016  Conclusion  Conclusions: 1. Moderate, non-critical coronary artery disease, including diffuse LAD disease (up to 60% in the distal vessel) and 70% rPL stenoses.  rPL lesions are in small vessel; would favor medical therapy over PCI unless patient develops refractory angina. 2. Normal left ventricular filling pressure. 3. Mildly reduced LV contraction with dyssynchrony, which may be related to RV pacing, as LVEF was normal by echo last month.  Recommendations: 1. Given lack of chest pain for several months, will pursue aggressive medical therapy.  Start isosorbide mononitrate 15 mg daily and start rosuvastatin 5 mg M,W,F (as patient has experienced myalgias with statins in the past). 2. Suspect dyssynchrony from RV pacing is contributing to appearance of reduced LVEF today.  Echo on 06/01/16 showed normal systolic function. 3. Restart warfarin tonight and metformin in 48 hours. 4. Follow-up with Dr. Rayann Dunn as an outpatient  Diagnostic Dominance: Right  History of Present Illness:    Mr. Paul Dunn is here for 39-month follow-up.  Last saw Dr. Rayann Dunn.  He denies any recent acute illnesses or hospitalizations.  Denies any issues with palpitations or arrhythmias.  He has atrial  fibrillation and tachybradycardia syndrome with pacemaker in place.  He denies any weakness or fatigue currently as he had complained of previous visit with Dr. Rayann Dunn.  Had a recent remote normal device check for his pacemaker.  He denies any bleeding in stool or urine.  Denies any CVA or TIA-like symptoms.  Denies any orthostatic symptoms.  No PND or orthopnea, no CVA or TIA-like symptoms.  Denies any claudication-like symptoms, DVT or PE-like symptoms.  States he wears low-grade compression stockings for mild edema.  Has a history of colon polyps.  He has an upcoming colonoscopy.  He is aware he needs to stop his Coumadin 5 days prior.  Also states he has some degenerative disc disease in his back and is trying to exercise using weights and bicycle to help strengthen his back.  Otherwise he denies any issues.  States recent lab work at PCP was all within normal limits.     Past Medical History:  Diagnosis Date  . Asthma    "when I was a child"  . Atrial fibrillation ([place])    long standing persistent  . Bulging of intervertebral disc between L4 and L5   . GERD (gastroesophageal reflux disease)   . Gouty arthritis   . H/O hiatal hernia   . High cholesterol   . History of blood transfusion ~ 1984  . History of bronchitis    "used to get it twice/year; last time was ~ 2 yr ago" (04/12/2012)  . HTN (hypertension)   . Kidney cysts    "never treated it"  . Macular degeneration   . Peripheral vascular disease ([place])   . Pulmonary nodule   . Skin cancer   . Tachycardia-bradycardia syndrome [facility])    s/p PPM implant by Dr Paul Dunn 04/12/12  . Type 2 diabetes mellitus ([facility])     Current Medications: Current Meds  Medication Sig  . allopurinol (ZYLOPRIM) 300 MG tablet Take 300 mg by mouth daily.  . Calcium Carb-Cholecalciferol 600-500 MG-UNIT CAPS Take 1 tablet by mouth daily.  . Cholecalciferol (VITAMIN D) 125 MCG (5000 UT) CAPS Take 5,000 Units by mouth every evening.  . Cinnamon 500 MG capsule  Take 1,000 mg by mouth daily.  . clonazePAM (KLONOPIN) 1 MG tablet Take 1 mg by mouth at bedtime.  Marland Kitchen diltiazem (DILACOR XR) 180 MG 24 hr capsule TAKE TWO CAPSULES EACH DAY (Patient taking differently: Take 360 mg by mouth daily.)  . glipiZIDE (GLUCOTROL XL) 2.5 MG 24 hr tablet Take 2.5 mg by mouth daily.  Marland Kitchen lisinopril-hydrochlorothiazide (ZESTORETIC) 20-25 MG tablet Take 1 tablet by mouth daily.  . metFORMIN (GLUCOPHAGE) 500 MG tablet Take 1 tablet (500 mg total) by mouth daily with breakfast.  . Multiple Vitamins-Minerals (PRESERVISION AREDS 2) CAPS Take 2 capsules by mouth daily.  . Omega-3 Fatty Acids (FISH OIL) 1000 MG CAPS Take 1,000-2,000 mg by mouth See admin instructions. Take 1000 mg in the morning and 2000 mg at bedtime  . pantoprazole (PROTONIX) 40 MG tablet Take 40 mg by mouth daily.  . rosuvastatin (CRESTOR) 5 MG tablet TAKE 1 TABLET EVERY MONDAY, WEDNESDAY, &FRIDAY (Patient taking differently: Take 5 mg by mouth every Monday, Wednesday, and Friday.)  . warfarin (COUMADIN) 5 MG tablet Take 5-7.5 mg by mouth See  admin instructions. Take 5 mg by mouth daily except for Thursday take 7.5 mg  . Zinc 50 MG TABS Take 50 mg by mouth daily.      Allergies:   Shellfish allergy and Pseudoephedrine   Social History   Tobacco Use  . Smoking status: Former Smoker    Packs/day: 1.00    Years: 2.00    Pack years: 2.00    Types: Cigarettes    Start date: 09/12/1989    Quit date: 09/13/1991    Years since quitting: 29.2  . Smokeless tobacco: Former Systems developer    Types: [place] date: 09/12/1996  Vaping Use  . Vaping Use: Never used  Substance Use Topics  . Alcohol use: No    Alcohol/week: 0.0 standard drinks  . Drug use: No     Family Hx: The patient's family history includes Lung cancer in his father; Ulcers in his mother.  Review of Systems  Constitutional: Negative.  HENT: Negative.   Eyes: Negative.   Cardiovascular: Positive for irregular heartbeat.  Respiratory: Negative.    Endocrine: Negative.   Skin: Negative.   Musculoskeletal: Negative for back pain.  Gastrointestinal: Negative.   Genitourinary: Negative.   Neurological: Negative.   Psychiatric/Behavioral: Negative.      EKGs/Labs/Other Test Reviewed:    EKG:  EKG June 12, 2020 electronic ventricular pacemaker rate of 84 underlying rhythm atrial fibrillation.  Recent Labs: 11/19/2020: BUN 15; Creatinine, Ser 0.88; Potassium 4.2; Sodium 138   Recent Lipid Panel No results found for: CHOL, TRIG, HDL, CHOLHDL, LDLCALC, LDLDIRECT    Risk Assessment/Calculations:    CHA2DS2-VASc Score = 2  This indicates a 2.2% annual risk of stroke. The patient's score is based upon: CHF History: No HTN History: Yes Diabetes History: No Stroke History: No Vascular Disease History: No Age Score: 1 Gender Score: 0     Physical Exam:    VS:  BP 130/80   Pulse 78   Ht 6\' 1"  (1.854 m)   Wt 197 lb 3.2 oz (89.4 kg)   SpO2 99%   BMI 26.02 kg/m     Wt Readings from Last 3 Encounters:  12/11/20 197 lb 3.2 oz (89.4 kg)  11/19/20 199 lb (90.3 kg)  11/09/20 198 lb (89.8 kg)     Vitals reviewed.  Constitutional:      Appearance: Healthy appearance. Well-developed and not in distress.  Pulmonary:     Effort: Pulmonary effort is normal.     Breath sounds: Normal breath sounds and air entry.  Cardiovascular:     Irregular rhythm.  Edema:    Ankle: bilateral trace edema of the ankle. Musculoskeletal: Normal range of motion.     Cervical back: Full passive range of motion without pain and normal range of motion. Skin:    General: Skin is warm and dry.  Neurological:     Mental Status: Alert and oriented to person, place and time.         ASSESSMENT & PLAN:    1. Tachycardia-bradycardia syndrome [facility]) Remote pacer check 09/14/2020 Dr. Rayann Dunn remote device check: Reviewed. Normal device function. Battery status, leads stable. Histograms reviewed and appropriate. Routine follow-up  2.  Weakness Currently denies any weakness.  3. Other fatigue Currently denies any fatigue.  4. Atrial fibrillation, unspecified type ([place]) Heart rate today is 78.  Denies any bleeding on anticoagulation.  Continue diltiazem 180 mg daily.  Continue Coumadin as directed by primary care provider.  Has a pending colonoscopy.  May  hold Coumadin 5 days prior to procedure.  5.  Essential hypertension Blood pressure well controlled today at 130/80.  Continue lisinopril/hydrochlorothiazide 20/25 mg p.o. daily.  6.  Hyperlipidemia Continue Crestor 5 mg p.o. daily.  Labs per PCP      Dispo:  No follow-ups on file.   Medication Adjustments/Labs and Tests Ordered: Current medicines are reviewed at length with the patient today.  Concerns regarding medicines are outlined above.  Tests Ordered: No orders of the defined types were placed in this encounter.  Medication Changes: No orders of the defined types were placed in this encounter.   Signed, Verta Ellen, NP  12/11/2020 8:34 AM    [place], [id number]

## 2020-12-11 ENCOUNTER — Ambulatory Visit (INDEPENDENT_AMBULATORY_CARE_PROVIDER_SITE_OTHER): Payer: Medicare Other | Admitting: Family Medicine

## 2020-12-11 ENCOUNTER — Encounter: Payer: Self-pay | Admitting: Family Medicine

## 2020-12-11 VITALS — BP 130/80 | HR 78 | Ht 73.0 in | Wt 197.2 lb

## 2020-12-11 DIAGNOSIS — R531 Weakness: Secondary | ICD-10-CM | POA: Diagnosis not present

## 2020-12-11 DIAGNOSIS — I4891 Unspecified atrial fibrillation: Secondary | ICD-10-CM

## 2020-12-11 DIAGNOSIS — R5383 Other fatigue: Secondary | ICD-10-CM

## 2020-12-11 DIAGNOSIS — I495 Sick sinus syndrome: Secondary | ICD-10-CM

## 2020-12-11 DIAGNOSIS — I10 Essential (primary) hypertension: Secondary | ICD-10-CM

## 2020-12-11 NOTE — Patient Instructions (Signed)
Medication Instructions:  Continue all current medications.  Labwork: none  Testing/Procedures: none  Follow-Up: Keep already scheduled with Dr. [name] for October   Any Other Special Instructions Will Be Listed Below (If Applicable).  If you need a refill on your cardiac medications before your next appointment, please call your pharmacy.

## 2020-12-14 ENCOUNTER — Ambulatory Visit (INDEPENDENT_AMBULATORY_CARE_PROVIDER_SITE_OTHER): Payer: Medicare Other

## 2020-12-14 DIAGNOSIS — I495 Sick sinus syndrome: Secondary | ICD-10-CM | POA: Diagnosis not present

## 2020-12-15 ENCOUNTER — Encounter (INDEPENDENT_AMBULATORY_CARE_PROVIDER_SITE_OTHER): Payer: Self-pay

## 2020-12-15 ENCOUNTER — Telehealth (INDEPENDENT_AMBULATORY_CARE_PROVIDER_SITE_OTHER): Payer: Self-pay

## 2020-12-15 LAB — CUP PACEART REMOTE DEVICE CHECK
Battery Remaining Longevity: 86 mo
Battery Remaining Percentage: 65 %
Battery Voltage: 2.9 V
Brady Statistic RV Percent Paced: 90 %
Date Time Interrogation Session: 20220404023409
Implantable Lead Implant Date: 20130801
Implantable Lead Implant Date: 20130801
Implantable Lead Location: 753859
Implantable Lead Location: 753860
Implantable Lead Model: 1948
Implantable Pulse Generator Implant Date: 20130801
Lead Channel Impedance Value: 590 Ohm
Lead Channel Pacing Threshold Amplitude: 0.75 V
Lead Channel Pacing Threshold Pulse Width: 0.4 ms
Lead Channel Sensing Intrinsic Amplitude: 10.6 mV
Lead Channel Setting Pacing Amplitude: 2.5 V
Lead Channel Setting Pacing Pulse Width: 0.4 ms
Lead Channel Setting Sensing Sensitivity: 2 mV
Pulse Gen Model: 2210
Pulse Gen Serial Number: 7377947

## 2020-12-15 NOTE — Telephone Encounter (Signed)
LeighAnn Kennethia Lynes, CMA  

## 2020-12-15 NOTE — Telephone Encounter (Signed)
Paul Dunn, CMA  

## 2020-12-21 DIAGNOSIS — Z7901 Long term (current) use of anticoagulants: Secondary | ICD-10-CM | POA: Diagnosis not present

## 2020-12-23 NOTE — Progress Notes (Signed)
Remote pacemaker transmission.   

## 2020-12-24 NOTE — Patient Instructions (Signed)
90    Your procedure is scheduled on: 01/01/2021  Report to [facility] at 6:15    AM.  Call this number if you have problems the morning of surgery: [phone]   Remember:   Follow instructions on letter from office regarding when to stop eating and drinking             No Smoking the day of procedure             Hold Coumadin for 5 days and bridge with lovenox as instructed per office.  No lovenox am of procedure             No diabetic medication morning of procedure      Take these medicines the morning of surgery with A SIP OF WATER: Diltiazem and Pantoprazole   Do not wear jewelry, make-up or nail polish.  Do not wear lotions, powders, or perfumes. You may wear deodorant.                Do not bring valuables to the hospital.  Contacts, dentures or bridgework may not be worn into surgery.  Leave suitcase in the car. After surgery it may be brought to your room.  For patients admitted to the hospital, checkout time is 11:00 AM the day of discharge.   Patients discharged the day of surgery will not be allowed to drive home. Upper Endoscopy, Adult Upper endoscopy is a procedure to look inside the upper GI (gastrointestinal) tract. The upper GI tract is made up of:  The part of the body that moves food from your mouth to your stomach (esophagus).  The stomach.  The first part of your small intestine (duodenum). This procedure is also called esophagogastroduodenoscopy (EGD) or gastroscopy. In this procedure, your health care provider passes a thin, flexible tube (endoscope) through your mouth and down your esophagus into your stomach. A small camera is attached to the end of the tube. Images from the camera appear on a monitor in the exam room. During this procedure, your health care provider may also remove a small piece of tissue to be sent to a lab and examined under a microscope (biopsy). Your health care provider may do an upper endoscopy to diagnose cancers of the upper GI  tract. You may also have this procedure to find the cause of other conditions, such as:  Stomach pain.  Heartburn.  Pain or problems when swallowing.  Nausea and vomiting.  Stomach bleeding.  Stomach ulcers. Tell a health care provider about:  Any allergies you have.  All medicines you are taking, including vitamins, herbs, eye drops, creams, and over-the-counter medicines.  Any problems you or family members have had with anesthetic medicines.  Any blood disorders you have.  Any surgeries you have had.  Any medical conditions you have.  Whether you are pregnant or may be pregnant. What are the risks? Generally, this is a safe procedure. However, problems may occur, including:  Infection.  Bleeding.  Allergic reactions to medicines.  A tear or hole (perforation) in the esophagus, stomach, or duodenum. What happens before the procedure? Staying hydrated Follow instructions from your health care provider about hydration, which may include:  Up to 2 hours before the procedure - you may continue to drink clear liquids, such as water, clear fruit juice, black coffee, and plain tea.  Eating and drinking restrictions Follow instructions from your health care provider about eating and drinking, which may include:  8 hours before the procedure -  stop eating heavy meals or foods, such as meat, fried foods, or fatty foods.  6 hours before the procedure - stop eating light meals or foods, such as toast or cereal.  6 hours before the procedure - stop drinking milk or drinks that contain milk.  2 hours before the procedure - stop drinking clear liquids. Medicines Ask your health care provider about:  Changing or stopping your regular medicines. This is especially important if you are taking diabetes medicines or blood thinners.  Taking medicines such as aspirin and ibuprofen. These medicines can thin your blood. Do not take these medicines unless your health care provider  tells you to take them.  Taking over-the-counter medicines, vitamins, herbs, and supplements. General instructions  Plan to have someone take you home from the hospital or clinic.  If you will be going home right after the procedure, plan to have someone with you for 24 hours.  Ask your health care provider what steps will be taken to help prevent infection. What happens during the procedure?  1. An IV will be inserted into one of your veins. 2. You may be given one or more of the following: ? A medicine to help you relax (sedative). ? A medicine to numb the throat (local anesthetic). 3. You will lie on your left side on an exam table. 4. Your health care provider will pass the endoscope through your mouth and down your esophagus. 5. Your health care provider will use the scope to check the inside of your esophagus, stomach, and duodenum. Biopsies may be taken. 6. The endoscope will be removed. The procedure may vary among health care providers and hospitals. What happens after the procedure?  Your blood pressure, heart rate, breathing rate, and blood oxygen level will be monitored until you leave the hospital or clinic.  Do not drive for 24 hours if you were given a sedative during your procedure.  When your throat is no longer numb, you may be given some fluids to drink.  It is up to you to get the results of your procedure. Ask your health care provider, or the department that is doing the procedure, when your results will be ready. Summary  Upper endoscopy is a procedure to look inside the upper GI tract.  During the procedure, an IV will be inserted into one of your veins. You may be given a medicine to help you relax.  A medicine will be used to numb your throat.  The endoscope will be passed through your mouth and down your esophagus. This information is not intended to replace advice given to you by your health care provider. Make sure you discuss any questions you have  with your health care provider. Document Revised: 02/21/2018 Document Reviewed: 01/29/2018 Elsevier Patient Education  [place].  EndoscopyCare After  Please read the instructions outlined below and refer to this sheet in the next few weeks. These discharge instructions provide you with general information on caring for yourself after you leave the hospital. Your doctor may also give you specific instructions. While your treatment has been planned according to the most current medical practices available, unavoidable complications occasionally occur. If you have any problems or questions after discharge, please call your doctor. HOME CARE INSTRUCTIONS Activity  You may resume your regular activity but move at a slower pace for the next 24 hours.   Take frequent rest periods for the next 24 hours.   Walking will help expel (get rid of) the air and reduce the bloated feeling in your abdomen.   No driving for 24 hours (because of the anesthesia (medicine) used during the test).   You may shower.   Do not sign any important legal documents or operate any machinery for 24 hours (because of the anesthesia used during the test).  Nutrition  Drink plenty of fluids.   You may resume your normal diet.   Begin with a light meal and progress to your normal diet.   Avoid alcoholic beverages for 24 hours or as instructed by your caregiver.  Medications You may resume your normal medications unless your caregiver tells you otherwise. What you can expect today  You may experience abdominal discomfort such as a feeling of fullness or "gas" pains.   You may experience a sore throat for 2 to 3 days. This is normal. Gargling with salt water may help this.  Follow-up Your doctor will discuss the results of your test with you. SEEK IMMEDIATE MEDICAL CARE  IF:  You have excessive nausea (feeling sick to your stomach) and/or vomiting.   You have severe abdominal pain and distention (swelling).   You have trouble swallowing.   You have a temperature over 100 F (37.8 C).   You have rectal bleeding or vomiting of blood.  Document Released: 04/12/2004 Document Revised: 08/18/2011 Document Reviewed: 10/24/2007

## 2020-12-30 ENCOUNTER — Encounter (HOSPITAL_COMMUNITY): Payer: Self-pay

## 2020-12-30 ENCOUNTER — Other Ambulatory Visit: Payer: Self-pay

## 2020-12-30 ENCOUNTER — Encounter (HOSPITAL_COMMUNITY)
Admission: RE | Admit: 2020-12-30 | Discharge: 2020-12-30 | Disposition: A | Discharge status: Home / Self Care | Payer: Medicare Other | Source: Ambulatory Visit | Attending: Gastroenterology | Admitting: Gastroenterology

## 2020-12-30 ENCOUNTER — Other Ambulatory Visit (HOSPITAL_COMMUNITY)
Admission: RE | Admit: 2020-12-30 | Discharge: 2020-12-30 | Disposition: A | Discharge status: Home / Self Care | Payer: Medicare Other | Source: Ambulatory Visit | Attending: Gastroenterology | Admitting: Gastroenterology

## 2020-12-30 DIAGNOSIS — Z01812 Encounter for preprocedural laboratory examination: Secondary | ICD-10-CM | POA: Diagnosis not present

## 2020-12-30 DIAGNOSIS — Z20822 Contact with and (suspected) exposure to covid-19: Secondary | ICD-10-CM | POA: Diagnosis not present

## 2020-12-30 DIAGNOSIS — K219 Gastro-esophageal reflux disease without esophagitis: Secondary | ICD-10-CM

## 2020-12-31 LAB — SARS CORONAVIRUS 2 (TAT 6-24 HRS): SARS Coronavirus 2: NEGATIVE

## 2021-01-01 ENCOUNTER — Encounter (HOSPITAL_COMMUNITY)
Admission: RE | Disposition: A | Discharge status: Home / Self Care | Payer: Self-pay | Source: Home / Self Care | Attending: Gastroenterology

## 2021-01-01 ENCOUNTER — Ambulatory Visit (HOSPITAL_COMMUNITY): Payer: Medicare Other | Admitting: Anesthesiology

## 2021-01-01 ENCOUNTER — Ambulatory Visit (HOSPITAL_COMMUNITY)
Admission: RE | Admit: 2021-01-01 | Discharge: 2021-01-01 | Disposition: A | Discharge status: Home / Self Care | Payer: Medicare Other | Attending: Gastroenterology | Admitting: Gastroenterology

## 2021-01-01 ENCOUNTER — Encounter (HOSPITAL_COMMUNITY): Payer: Self-pay | Admitting: Gastroenterology

## 2021-01-01 DIAGNOSIS — Z79899 Other long term (current) drug therapy: Secondary | ICD-10-CM | POA: Insufficient documentation

## 2021-01-01 DIAGNOSIS — E785 Hyperlipidemia, unspecified: Secondary | ICD-10-CM | POA: Insufficient documentation

## 2021-01-01 DIAGNOSIS — Z950 Presence of cardiac pacemaker: Secondary | ICD-10-CM | POA: Diagnosis not present

## 2021-01-01 DIAGNOSIS — Z87891 Personal history of nicotine dependence: Secondary | ICD-10-CM | POA: Diagnosis not present

## 2021-01-01 DIAGNOSIS — I495 Sick sinus syndrome: Secondary | ICD-10-CM | POA: Diagnosis not present

## 2021-01-01 DIAGNOSIS — Z888 Allergy status to other drugs, medicaments and biological substances status: Secondary | ICD-10-CM | POA: Insufficient documentation

## 2021-01-01 DIAGNOSIS — C7A8 Other malignant neuroendocrine tumors: Secondary | ICD-10-CM | POA: Diagnosis not present

## 2021-01-01 DIAGNOSIS — I10 Essential (primary) hypertension: Secondary | ICD-10-CM | POA: Insufficient documentation

## 2021-01-01 DIAGNOSIS — D3A8 Other benign neuroendocrine tumors: Secondary | ICD-10-CM | POA: Insufficient documentation

## 2021-01-01 DIAGNOSIS — K317 Polyp of stomach and duodenum: Secondary | ICD-10-CM | POA: Diagnosis not present

## 2021-01-01 DIAGNOSIS — E1151 Type 2 diabetes mellitus with diabetic peripheral angiopathy without gangrene: Secondary | ICD-10-CM | POA: Insufficient documentation

## 2021-01-01 DIAGNOSIS — I4891 Unspecified atrial fibrillation: Secondary | ICD-10-CM | POA: Insufficient documentation

## 2021-01-01 DIAGNOSIS — Z85060 Personal history of malignant carcinoid tumor of small intestine: Secondary | ICD-10-CM

## 2021-01-01 DIAGNOSIS — R12 Heartburn: Secondary | ICD-10-CM

## 2021-01-01 DIAGNOSIS — K449 Diaphragmatic hernia without obstruction or gangrene: Secondary | ICD-10-CM

## 2021-01-01 DIAGNOSIS — K219 Gastro-esophageal reflux disease without esophagitis: Secondary | ICD-10-CM | POA: Diagnosis not present

## 2021-01-01 DIAGNOSIS — J45909 Unspecified asthma, uncomplicated: Secondary | ICD-10-CM | POA: Diagnosis not present

## 2021-01-01 DIAGNOSIS — Z7984 Long term (current) use of oral hypoglycemic drugs: Secondary | ICD-10-CM | POA: Insufficient documentation

## 2021-01-01 DIAGNOSIS — Z7901 Long term (current) use of anticoagulants: Secondary | ICD-10-CM | POA: Insufficient documentation

## 2021-01-01 HISTORY — PX: ESOPHAGOGASTRODUODENOSCOPY (EGD) WITH PROPOFOL: SHX5813

## 2021-01-01 HISTORY — PX: POLYPECTOMY: SHX5525

## 2021-01-01 HISTORY — PX: HEMOSTASIS CLIP PLACEMENT: SHX6857

## 2021-01-01 LAB — GLUCOSE, CAPILLARY: Glucose-Capillary: 139 mg/dL — ABNORMAL HIGH (ref 70–99)

## 2021-01-01 SURGERY — ESOPHAGOGASTRODUODENOSCOPY (EGD) WITH PROPOFOL
Anesthesia: General

## 2021-01-01 MED ORDER — LACTATED RINGERS IV SOLN
INTRAVENOUS | Status: DC
  Administered 2021-01-01: 1000 mL via INTRAVENOUS

## 2021-01-01 MED ORDER — PROPOFOL 500 MG/50ML IV EMUL
INTRAVENOUS | Status: DC | PRN
  Administered 2021-01-01: 150 ug/kg/min via INTRAVENOUS

## 2021-01-01 MED ORDER — CHLORHEXIDINE GLUCONATE CLOTH 2 % EX PADS: 6.0000 | MEDICATED_PAD | Freq: Once | CUTANEOUS | Status: DC

## 2021-01-01 MED ORDER — PROPOFOL 10 MG/ML IV BOLUS
INTRAVENOUS | Status: AC
  Filled 2021-01-01: qty 60

## 2021-01-01 MED ORDER — OMEPRAZOLE 40 MG PO CPDR: 40.0000 mg | DELAYED_RELEASE_CAPSULE | Freq: Every day | ORAL | 3 refills | Status: DC

## 2021-01-01 MED ORDER — PROPOFOL 10 MG/ML IV BOLUS
INTRAVENOUS | Status: DC | PRN
  Administered 2021-01-01 (×2): 40 mg via INTRAVENOUS
  Administered 2021-01-01 (×4): 20 mg via INTRAVENOUS
  Administered 2021-01-01: 40 mg via INTRAVENOUS
  Administered 2021-01-01 (×3): 20 mg via INTRAVENOUS

## 2021-01-01 NOTE — Anesthesia Preprocedure Evaluation (Signed)
Anesthesia Evaluation  Patient identified by MRN, date of birth, ID band Patient awake    Reviewed: Allergy & Precautions, H&P , NPO status , Patient's Chart, lab work & pertinent test results, reviewed documented beta blocker date and time   Airway Mallampati: II  TM Distance: >3 FB Neck ROM: full    Dental no notable dental hx.    Pulmonary asthma , former smoker,    Pulmonary exam normal breath sounds clear to auscultation       Cardiovascular Exercise Tolerance: Good hypertension, + pacemaker  Rhythm:regular Rate:Normal     Neuro/Psych negative neurological ROS  negative psych ROS   GI/Hepatic Neg liver ROS, hiatal hernia, GERD  Medicated,  Endo/Other  negative endocrine ROSdiabetes  Renal/GU Renal disease  negative genitourinary   Musculoskeletal   Abdominal   Peds  Hematology negative hematology ROS (+)   Anesthesia Other Findings   Reproductive/Obstetrics negative OB ROS                             Anesthesia Physical Anesthesia Plan  ASA: III  Anesthesia Plan: General   Post-op Pain Management:    Induction:   PONV Risk Score and Plan: Propofol infusion  Airway Management Planned:   Additional Equipment:   Intra-op Plan:   Post-operative Plan:   Informed Consent: I have reviewed the patients History and Physical, chart, labs and discussed the procedure including the risks, benefits and alternatives for the proposed anesthesia with the patient or authorized representative who has indicated his/her understanding and acceptance.     Dental Advisory Given  Plan Discussed with: CRNA  Anesthesia Plan Comments:         Anesthesia Quick Evaluation

## 2021-01-01 NOTE — H&P (Signed)
Paul Dunn is an 70 y.o. male.   Chief Complaint: GERD and history of duodenal carcinoid HPI: Paul Dunn is a 70 y.o. 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  The patient has been taking Protonix compliantly 40 mg every day.  He has reports that his heartburn has improved since he has implemented lifestyle modifications but also has taken the medication in an appropriate way.  However he is still having some mild occasional heartburn episodes.  Denies any dysphagia or odynophagia. The patient denies having any nausea, vomiting, fever, chills, hematochezia, melena, hematemesis, abdominal distention, abdominal pain, diarrhea, jaundice, pruritus or weight loss.  His last EGD was performed in 2011 as part of the surveillance of his duodenal carcinoid.  Past Medical History:  Diagnosis Date  . Asthma    "when I was a child"  . Atrial fibrillation ([place])    long standing persistent  . Bulging of intervertebral disc between L4 and L5   . GERD (gastroesophageal reflux disease)   . Gouty arthritis   . H/O hiatal hernia   . High cholesterol   . History of blood transfusion ~ 1984  . History of bronchitis    "used to get it twice/year; last time was ~ 2 yr ago" (04/12/2012)  . HTN (hypertension)   . Kidney cysts    "never treated it"  . Macular degeneration   . Peripheral vascular disease ([place])   . Pulmonary nodule   . Skin cancer   . Tachycardia-bradycardia syndrome [facility])    s/p PPM implant by Dr [name] 04/12/12  . Type 2 diabetes mellitus ([place])     Past Surgical History:  Procedure Laterality Date  . CARDIAC CATHETERIZATION N/A 06/23/2016   Procedure: Left Heart Cath and Coronary Angiography;  Surgeon: Nelva Bush, MD;  Location: [place] CV LAB;  Service:  Cardiovascular;  Laterality: N/A;  . CATARACT EXTRACTION, BILATERAL    . COLONOSCOPY N/A 05/22/2019   Procedure: COLONOSCOPY;  Surgeon: Rogene Houston, MD;  [facility];  Service: Endoscopy;  Laterality: N/A;  930  . ESOPHAGEAL DILATION  01/2012  . LACERATION REPAIR  ~ 1984   left forearm; "cut it w/a power saw"  . MELANOMA EXCISION  ~ 2011   left posterior shoulder  . PACEMAKER INSERTION  04/12/2012   SJM Accent DR RF pacemaker implanted by Dr [name] for tachy/brady syndrome  . PERMANENT PACEMAKER INSERTION N/A 04/12/2012   Procedure: PERMANENT PACEMAKER INSERTION;  Surgeon: Thompson Grayer, MD;  Location: [facility] CATH LAB;  Service: Cardiovascular;  Laterality: N/A;  . POLYPECTOMY  05/22/2019   Procedure: POLYPECTOMY;  Surgeon: Rogene Houston, MD;  [facility];  Service: Endoscopy;;  transverse, cecum,ascending,     Family History  Problem Relation Age of Onset  . Lung cancer Father   . Ulcers Mother    Social History:  reports that he quit smoking about 29 years ago. His smoking use included cigarettes. He started smoking about 31 years ago. He has a 2.00 pack-year smoking history. He quit smokeless tobacco use about 24 years ago.  His smokeless tobacco use included chew. He reports that he does not drink alcohol and does not use drugs.  Allergies:  Allergies  Allergen Reactions  . Shellfish Allergy Nausea And Vomiting  . Pseudoephedrine Other (See Comments)    unknown  Medications Prior to Admission  Medication Sig Dispense Refill  . allopurinol (ZYLOPRIM) 300 MG tablet Take 300 mg by mouth daily.    . Calcium Carb-Cholecalciferol 600-500 MG-UNIT CAPS Take 1 tablet by mouth daily.    . Cholecalciferol (VITAMIN D) 125 MCG (5000 UT) CAPS Take 5,000 Units by mouth every evening.    . Cinnamon 500 MG capsule Take 1,000 mg by mouth daily.    . clonazePAM (KLONOPIN) 1 MG tablet Take 1 mg by mouth at bedtime.    Marland Kitchen diltiazem (DILACOR XR) 180 MG 24 hr capsule TAKE TWO  CAPSULES EACH DAY (Patient taking differently: Take 360 mg by mouth daily.) 180 capsule 1  . glipiZIDE (GLUCOTROL XL) 2.5 MG 24 hr tablet Take 2.5 mg by mouth daily.    Marland Kitchen lisinopril-hydrochlorothiazide (ZESTORETIC) 20-25 MG tablet Take 1 tablet by mouth daily.    . metFORMIN (GLUCOPHAGE) 500 MG tablet Take 1 tablet (500 mg total) by mouth daily with breakfast.    . Multiple Vitamins-Minerals (PRESERVISION AREDS 2) CAPS Take 2 capsules by mouth daily.    . Omega-3 Fatty Acids (FISH OIL) 1000 MG CAPS Take 1,000-2,000 mg by mouth See admin instructions. Take 1000 mg in the morning and 2000 mg at bedtime    . pantoprazole (PROTONIX) 40 MG tablet Take 40 mg by mouth daily.    . rosuvastatin (CRESTOR) 5 MG tablet TAKE 1 TABLET EVERY MONDAY, WEDNESDAY, &FRIDAY (Patient taking differently: Take 5 mg by mouth every Monday, Wednesday, and Friday.) 38 tablet 2  . warfarin (COUMADIN) 5 MG tablet Take 7.5 mg by mouth daily.    . Zinc 50 MG TABS Take 50 mg by mouth daily.       Results for orders placed or performed during the hospital encounter of 01/01/21 (from the past 48 hour(s))  Glucose, capillary     Status: Abnormal   Collection Time: 01/01/21  6:36 AM  Result Value Ref Range   Glucose-Capillary 139 (H) 70 - 99 mg/dL    Comment: Glucose reference range applies only to samples taken after fasting for at least 8 hours.   No results found.  Review of Systems  Constitutional: Negative.   HENT: Negative.   Eyes: Negative.   Respiratory: Negative.   Cardiovascular: Negative.   Gastrointestinal: Negative.   Endocrine: Negative.   Genitourinary: Negative.   Musculoskeletal: Negative.   Skin: Negative.   Allergic/Immunologic: Negative.   Neurological: Negative.   Hematological: Negative.   Psychiatric/Behavioral: Negative.     Blood pressure 128/78, temperature 97.6 F (36.4 C), temperature source Oral, resp. rate 16, height 6\' 1"  (1.854 m), weight 90.7 kg, SpO2 100 %. Physical Exam   GENERAL: The patient is AO x3, in no acute distress. HEENT: Head is normocephalic and atraumatic. EOMI are intact. Mouth is well hydrated and without lesions. NECK: Supple. No masses LUNGS: Clear to auscultation. No presence of rhonchi/wheezing/rales. Adequate chest expansion HEART: RRR, normal s1 and s2. ABDOMEN: Soft, nontender, no guarding, no peritoneal signs, and nondistended. BS +. No masses. EXTREMITIES: Without any cyanosis, clubbing, rash, lesions or edema. NEUROLOGIC: AOx3, no focal motor deficit. SKIN: no jaundice, no rashes  Assessment/Plan Paul Dunn is a 70 y.o. 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  We will proceed with EGD  Harvel Quale, MD 01/01/2021, 7:28 AM

## 2021-01-01 NOTE — Anesthesia Postprocedure Evaluation (Signed)
Anesthesia Post Note  Patient: Paul Dunn  Procedure(s) Performed: ESOPHAGOGASTRODUODENOSCOPY (EGD) WITH PROPOFOL (N/A ) POLYPECTOMY HEMOSTASIS CLIP PLACEMENT  Patient location during evaluation: Short Stay Anesthesia Type: General Level of consciousness: awake and alert and oriented Pain management: pain level controlled Vital Signs Assessment: post-procedure vital signs reviewed and stable Respiratory status: spontaneous breathing Cardiovascular status: blood pressure returned to baseline and stable Postop Assessment: no apparent nausea or vomiting Anesthetic complications: no   No complications documented.   Last Vitals:  Vitals:   01/01/21 0656  BP: 128/78  Resp: 16  Temp: 36.4 C  SpO2: 100%    Last Pain:  Vitals:   01/01/21 0732  TempSrc:   PainSc: 0-No pain                 Vera Furniss

## 2021-01-01 NOTE — Discharge Instructions (Addendum)
You are being discharged to home.  Resume your previous diet.  We are waiting for your pathology results.  Your physician has recommended a repeat upper endoscopy in one year for surveillance.  Take Prilosec (omeprazole) 40 mg by mouth twice a day for four weeks, then switch to once a day.  - Restart coumadin tomorrow - skip Lovenox dose today.   Omeprazole Tablets What is this medicine? OMEPRAZOLE (oh ME pray zol) prevents the production of acid in the stomach. It is used to treat the symptoms of heartburn. You can buy this medicine without a prescription. This product is not for long-term use, unless otherwise directed by your doctor or health care professional. This medicine may be used for other purposes; ask your health care provider or pharmacist if you have questions. COMMON BRAND NAME(S): Prilosec OTC What should I tell my health care provider before I take this medicine? They need to know if you have any of these conditions:  black or bloody stools  chest pain  difficulty swallowing  have had heartburn for over 3 months  have heartburn with dizziness, lightheadedness or sweating  liver disease  lupus  stomach pain  unexplained weight loss  vomiting with blood  wheezing  an unusual or allergic reaction to omeprazole, other medicines, foods, dyes, or preservatives  pregnant or trying to get pregnant  breast-feeding How should I use this medicine? Take this medicine by mouth with a glass of water. Follow the directions on the product label. Do not cut, crush or chew this medicine. Swallow the tablets whole. Take this medicine on an empty stomach, at least 30 minutes before breakfast. Take your medicine at regular intervals. Do not take it more often than directed. Talk to your pediatrician regarding the use of this medicine in children. Special care may be needed. Overdosage: If you think you have taken too much of this medicine contact a poison control center or  emergency room at once. NOTE: This medicine is only for you. Do not share this medicine with others. What if I miss a dose? If you miss a dose, take it as soon as you can. If it is almost time for your next dose, take only that dose. Do not take double or extra doses. What may interact with this medicine? Do not take this medicine with any of the following medications:  atazanavir  clopidogrel  nelfinavir  rilpivirine This medicine may also interact with the following medications:  antifungals like itraconazole, ketoconazole, and voriconazole  certain antivirals for HIV or hepatitis  certain medicines that treat or prevent blood clots like warfarin  cilostazol  citalopram  cyclosporine  dasatinib  digoxin  disulfiram  diuretics  erlotinib  iron supplements  medicines for anxiety, panic, and sleep like diazepam  medicines for seizures like carbamazepine, phenobarbital, phenytoin  methotrexate  mycophenolate mofetil  nilotinib  rifampin  St. John's wort  tacrolimus  vitamin B12 This list may not describe all possible interactions. Give your health care provider a list of all the medicines, herbs, non-prescription drugs, or dietary supplements you use. Also tell them if you smoke, drink alcohol, or use illegal drugs. Some items may interact with your medicine. What should I watch for while using this medicine? It can take several days before your heartburn gets better. Tell your healthcare professional if your symptoms do not start to get better or if they get worse. If you need to take this medicine for more than 14 days, talk to your healthcare  professional. Heartburn may sometimes be caused by a more serious condition. This medicine may cause a decrease in vitamin B12. You should make sure that you get enough vitamin B12 while you are taking this medicine. Discuss the foods you eat and the vitamins you take with your health care professional. What side  effects may I notice from receiving this medicine? Side effects that you should report to your doctor or health care professional as soon as possible:  allergic reactions like skin rash, itching or hives, swelling of the face, lips, or tongue  bone pain  breathing problems  fever or sore throat  joint pain  rash on cheeks or arms that gets worse in the sun  redness, blistering, peeling, or loosening of the skin, including inside the mouth  severe diarrhea  signs and symptoms of kidney injury like trouble passing urine or change in the amount of urine  signs and symptoms of low magnesium like muscle cramps; muscle pain; muscle weakness; tremors; seizures; or fast, irregular heartbeat  stomach polyps  unusual bleeding or bruising Side effects that usually do not require medical attention (report to your doctor or health care professional if they continue or are bothersome):  diarrhea  dry mouth  gas  headache  nausea  stomach pain This list may not describe all possible side effects. Call your doctor for medical advice about side effects. You may report side effects to FDA at 1-800-FDA-1088. Where should I keep my medicine? Keep out of the reach of children. Store at room temperature between 20 and 25 degrees C (68 and 77 degrees F). Protect from light and moisture. Throw away any unused medicine after the expiration date. NOTE: This sheet is a summary. It may not cover all possible information. If you have questions about this medicine, talk to your doctor, pharmacist, or health care provider.  2021 Elsevier/Gold Standard (2020-07-08 18:47:00)  Upper Endoscopy, Adult, Care After This sheet gives you information about how to care for yourself after your procedure. Your health care provider may also give you more specific instructions. If you have problems or questions, contact your health care provider. What can I expect after the procedure? After the procedure, it is  common to have:  A sore throat.  Mild stomach pain or discomfort.  Bloating.  Nausea. Follow these instructions at home:  Follow instructions from your health care provider about what to eat or drink after your procedure.  Return to your normal activities as told by your health care provider. Ask your health care provider what activities are safe for you.  Take over-the-counter and prescription medicines only as told by your health care provider.  If you were given a sedative during the procedure, it can affect you for several hours. Do not drive or operate machinery until your health care provider says that it is safe.  Keep all follow-up visits as told by your health care provider. This is important.   Contact a health care provider if you have:  A sore throat that lasts longer than one day.  Trouble swallowing. Get help right away if:  You vomit blood or your vomit looks like coffee grounds.  You have: ? A fever. ? Bloody, black, or tarry stools. ? A severe sore throat or you cannot swallow. ? Difficulty breathing. ? Severe pain in your chest or abdomen. Summary  After the procedure, it is common to have a sore throat, mild stomach discomfort, bloating, and nausea.  If you were given a  sedative during the procedure, it can affect you for several hours. Do not drive or operate machinery until your health care provider says that it is safe.  Follow instructions from your health care provider about what to eat or drink after your procedure.  Return to your normal activities as told by your health care provider. This information is not intended to replace advice given to you by your health care provider. Make sure you discuss any questions you have with your health care provider. Document Revised: 08/27/2019 Document Reviewed: 01/29/2018 Elsevier Patient Education  2021 [place] After This sheet gives you information about how to  care for yourself after your procedure. Your health care provider may also give you more specific instructions. If you have problems or questions, contact your health care provider. What can I expect after the procedure? After the procedure, it is common to have:  Tiredness.  Forgetfulness about what happened after the procedure.  Impaired judgment for important decisions.  Nausea or vomiting.  Some difficulty with balance. Follow these instructions at home: For the time period you were told by your health care provider:  Rest as needed.  Do not participate in activities where you could fall or become injured.  Do not drive or use machinery.  Do not drink alcohol.  Do not take sleeping pills or medicines that cause drowsiness.  Do not make important decisions or sign legal documents.  Do not take care of children on your own.      Eating and drinking  Follow the diet that is recommended by your health care provider.  Drink enough fluid to keep your urine pale yellow.  If you vomit: ? Drink water, juice, or soup when you can drink without vomiting. ? Make sure you have little or no nausea before eating solid foods. General instructions  Have a responsible adult stay with you for the time you are told. It is important to have someone help care for you until you are awake and alert.  Take over-the-counter and prescription medicines only as told by your health care provider.  If you have sleep apnea, surgery and certain medicines can increase your risk for breathing problems. Follow instructions from your health care provider about wearing your sleep device: ? Anytime you are sleeping, including during daytime naps. ? While taking prescription pain medicines, sleeping medicines, or medicines that make you drowsy.  Avoid smoking.  Keep all follow-up visits as told by your health care provider. This is important. Contact a health care provider if:  You keep feeling  nauseous or you keep vomiting.  You feel light-headed.  You are still sleepy or having trouble with balance after 24 hours.  You develop a rash.  You have a fever.  You have redness or swelling around the IV site. Get help right away if:  You have trouble breathing.  You have new-onset confusion at home. Summary  For several hours after your procedure, you may feel tired. You may also be forgetful and have poor judgment.  Have a responsible adult stay with you for the time you are told. It is important to have someone help care for you until you are awake and alert.  Rest as told. Do not drive or operate machinery. Do not drink alcohol or take sleeping pills.  Get help right away if you have trouble breathing, or if you suddenly become confused. This information is not intended to replace advice given to you  by your health care provider. Make sure you discuss any questions you have with your health care provider. Document Revised: 05/14/2020 Document Reviewed: 08/01/2019 Elsevier Patient Education  2021 [employer].

## 2021-01-01 NOTE — Transfer of Care (Signed)
Immediate Anesthesia Transfer of Care Note  Patient: Paul Dunn  Procedure(s) Performed: ESOPHAGOGASTRODUODENOSCOPY (EGD) WITH PROPOFOL (N/A ) POLYPECTOMY HEMOSTASIS CLIP PLACEMENT  Patient Location: Short Stay  Anesthesia Type:General  Level of Consciousness: awake  Airway & Oxygen Therapy: Patient Spontanous Breathing  Post-op Assessment: Report given to RN  Post vital signs: Reviewed and stable  Last Vitals:  Vitals Value Taken Time  BP    Temp    Pulse    Resp    SpO2      Last Pain:  Vitals:   01/01/21 0732  TempSrc:   PainSc: 0-No pain      Patients Stated Pain Goal: 8 (04/59/97 7414)  Complications: No complications documented.

## 2021-01-01 NOTE — Op Note (Signed)
[facility] Patient Name: Paul Dunn Procedure Date: 01/01/2021 7:35 AM MRN: [id number] Date of Birth: 1951/07/31 Attending MD: Maylon Peppers ,  CSN: [id number] Age: 70 Admit Type: Outpatient Procedure:                Upper GI endoscopy Indications:              Heartburn, history of duodenal carcinoid Providers:                Maylon Peppers, Jeanann Lewandowsky. Sharon Seller, RN, Raphael Gibney, Technician Referring MD:              Medicines:                Monitored Anesthesia Care Complications:            No immediate complications. Estimated Blood Loss:     Estimated blood loss: none. Procedure:                Pre-Anesthesia Assessment:                           - Prior to the procedure, a History and Physical                            was performed, and patient medications, allergies                            and sensitivities were reviewed. The patient's                            tolerance of previous anesthesia was reviewed.                           - The risks and benefits of the procedure and the                            sedation options and risks were discussed with the                            patient. All questions were answered and informed                            consent was obtained.                           - ASA Grade Assessment: III - A patient with severe                            systemic disease.                           After obtaining informed consent, the endoscope was                            passed under direct vision. Throughout the  procedure, the patient's blood pressure, pulse, and                            oxygen saturations were monitored continuously. The                            [id number]) scope was introduced through the                            mouth, and advanced to the second part of duodenum.                            The upper GI endoscopy was accomplished  without                            difficulty. The patient tolerated the procedure                            well. Scope In: 7:37:43 AM Scope Out: 8:12:45 AM Total Procedure Duration: 0 hours 35 minutes 2 seconds  Findings:      A 1 cm sliding hiatal hernia was present.      The exam of the esophagus was otherwise normal.      Two 6 to 10 mm sessile polyps with no bleeding and no stigmata of recent       bleeding were found in the gastric fundus and in the gastric body. These       polyps were removed with a hot snare. Resection and retrieval were       complete. Giver presence of ongoing oozing after polypectomy, to prevent       bleeding after the polypectomy of the polypectomy site at the fundus       area, two hemostatic clips were successfully placed. There was no       bleeding at the end of the procedure.      A single 6 mm sessile polyp with no bleeding was found in the duodenal       bulb, with a position at 12 o clock. Area was successfully injected with       3 mL Eleview for a lift polypectomy. Imaging was performed using white       light and narrow band imaging to visualize the mucosa and demarcate the       polyp site after injection for EMR purposes. The polyp was removed with       a hot snare. Resection and retrieval were complete. Impression:               - 1 cm hiatal hernia.                           - Two gastric polyps. Resected and retrieved. Clips                            were placed.                           - A single duodenal polyp - likely previous  carcinoid. Resected and retrieved. Injected. Moderate Sedation:      Per Anesthesia Care Recommendation:           - Discharge patient to home (ambulatory).                           - Resume previous diet.                           - Await pathology results.                           - Repeat upper endoscopy in 1 year for surveillance.                           - Use Prilosec  (omeprazole) 40 mg PO BID for 4                            weeks, then switch to once a day.                           - Restart coumadin tomorrow - skip Lovenox dose                            today. Procedure Code(s):        --- Professional ---                           [phone], Esophagogastroduodenoscopy, flexible,                            transoral; with removal of tumor(s), polyp(s), or                            other lesion(s) by snare technique                           [id number], Esophagogastroduodenoscopy, flexible,                            transoral; with directed submucosal injection(s),                            any substance Diagnosis Code(s):        --- Professional ---                           K44.9, Diaphragmatic hernia without obstruction or                            gangrene                           K31.7, Polyp of stomach and duodenum                           R12, Heartburn CPT copyright 2019 American Medical Association. All rights reserved. The codes  documented in this report are preliminary and upon coder review may  be revised to meet current compliance requirements. Maylon Peppers, MD Maylon Peppers,  01/01/2021 8:21:42 AM This report has been signed electronically. Number of Addenda: 0

## 2021-01-05 LAB — SURGICAL PATHOLOGY

## 2021-01-06 ENCOUNTER — Encounter (HOSPITAL_COMMUNITY): Payer: Self-pay | Admitting: Gastroenterology

## 2021-01-06 DIAGNOSIS — I4891 Unspecified atrial fibrillation: Secondary | ICD-10-CM | POA: Diagnosis not present

## 2021-01-06 DIAGNOSIS — K2100 Gastro-esophageal reflux disease with esophagitis, without bleeding: Secondary | ICD-10-CM | POA: Diagnosis not present

## 2021-01-06 DIAGNOSIS — E782 Mixed hyperlipidemia: Secondary | ICD-10-CM | POA: Diagnosis not present

## 2021-01-06 DIAGNOSIS — I10 Essential (primary) hypertension: Secondary | ICD-10-CM | POA: Diagnosis not present

## 2021-01-06 DIAGNOSIS — E1142 Type 2 diabetes mellitus with diabetic polyneuropathy: Secondary | ICD-10-CM | POA: Diagnosis not present

## 2021-01-06 DIAGNOSIS — I4819 Other persistent atrial fibrillation: Secondary | ICD-10-CM | POA: Diagnosis not present

## 2021-01-06 DIAGNOSIS — M1009 Idiopathic gout, multiple sites: Secondary | ICD-10-CM | POA: Diagnosis not present

## 2021-01-12 DIAGNOSIS — D2261 Melanocytic nevi of right upper limb, including shoulder: Secondary | ICD-10-CM | POA: Diagnosis not present

## 2021-01-12 DIAGNOSIS — X32XXXD Exposure to sunlight, subsequent encounter: Secondary | ICD-10-CM | POA: Diagnosis not present

## 2021-01-12 DIAGNOSIS — D225 Melanocytic nevi of trunk: Secondary | ICD-10-CM | POA: Diagnosis not present

## 2021-01-12 DIAGNOSIS — L570 Actinic keratosis: Secondary | ICD-10-CM | POA: Diagnosis not present

## 2021-01-12 DIAGNOSIS — D485 Neoplasm of uncertain behavior of skin: Secondary | ICD-10-CM | POA: Diagnosis not present

## 2021-01-12 DIAGNOSIS — Z1283 Encounter for screening for malignant neoplasm of skin: Secondary | ICD-10-CM | POA: Diagnosis not present

## 2021-01-18 DIAGNOSIS — I4891 Unspecified atrial fibrillation: Secondary | ICD-10-CM | POA: Diagnosis not present

## 2021-01-19 DIAGNOSIS — R0789 Other chest pain: Secondary | ICD-10-CM | POA: Diagnosis not present

## 2021-01-19 DIAGNOSIS — R0602 Shortness of breath: Secondary | ICD-10-CM | POA: Diagnosis not present

## 2021-01-19 DIAGNOSIS — R9439 Abnormal result of other cardiovascular function study: Secondary | ICD-10-CM | POA: Diagnosis not present

## 2021-02-03 DIAGNOSIS — L988 Other specified disorders of the skin and subcutaneous tissue: Secondary | ICD-10-CM | POA: Diagnosis not present

## 2021-02-03 DIAGNOSIS — D485 Neoplasm of uncertain behavior of skin: Secondary | ICD-10-CM | POA: Diagnosis not present

## 2021-02-10 DIAGNOSIS — Z7901 Long term (current) use of anticoagulants: Secondary | ICD-10-CM | POA: Diagnosis not present

## 2021-02-11 ENCOUNTER — Ambulatory Visit (INDEPENDENT_AMBULATORY_CARE_PROVIDER_SITE_OTHER): Payer: Medicare Other | Admitting: Gastroenterology

## 2021-03-16 ENCOUNTER — Ambulatory Visit (INDEPENDENT_AMBULATORY_CARE_PROVIDER_SITE_OTHER): Payer: Medicare Other

## 2021-03-16 DIAGNOSIS — I119 Hypertensive heart disease without heart failure: Secondary | ICD-10-CM

## 2021-03-16 LAB — CUP PACEART REMOTE DEVICE CHECK
Battery Remaining Longevity: 46 mo
Battery Remaining Percentage: 35 %
Battery Voltage: 2.9 V
Brady Statistic RV Percent Paced: 90 %
Date Time Interrogation Session: 20220704030826
Implantable Lead Implant Date: 20130801
Implantable Lead Implant Date: 20130801
Implantable Lead Location: 753859
Implantable Lead Location: 753860
Implantable Lead Model: 1948
Implantable Pulse Generator Implant Date: 20130801
Lead Channel Impedance Value: 560 Ohm
Lead Channel Pacing Threshold Amplitude: 0.75 V
Lead Channel Pacing Threshold Pulse Width: 0.4 ms
Lead Channel Sensing Intrinsic Amplitude: 9.3 mV
Lead Channel Setting Pacing Amplitude: 2.5 V
Lead Channel Setting Pacing Pulse Width: 0.4 ms
Lead Channel Setting Sensing Sensitivity: 2 mV
Pulse Gen Model: 2210
Pulse Gen Serial Number: 7377947

## 2021-03-22 DIAGNOSIS — I4891 Unspecified atrial fibrillation: Secondary | ICD-10-CM | POA: Diagnosis not present

## 2021-04-02 NOTE — Progress Notes (Signed)
Remote pacemaker transmission.   

## 2021-04-13 DIAGNOSIS — E1142 Type 2 diabetes mellitus with diabetic polyneuropathy: Secondary | ICD-10-CM | POA: Diagnosis not present

## 2021-04-13 DIAGNOSIS — E782 Mixed hyperlipidemia: Secondary | ICD-10-CM | POA: Diagnosis not present

## 2021-04-13 DIAGNOSIS — I10 Essential (primary) hypertension: Secondary | ICD-10-CM | POA: Diagnosis not present

## 2021-04-13 DIAGNOSIS — I4891 Unspecified atrial fibrillation: Secondary | ICD-10-CM | POA: Diagnosis not present

## 2021-04-13 DIAGNOSIS — M1009 Idiopathic gout, multiple sites: Secondary | ICD-10-CM | POA: Diagnosis not present

## 2021-04-13 DIAGNOSIS — K2100 Gastro-esophageal reflux disease with esophagitis, without bleeding: Secondary | ICD-10-CM | POA: Diagnosis not present

## 2021-04-14 DIAGNOSIS — Z8619 Personal history of other infectious and parasitic diseases: Secondary | ICD-10-CM | POA: Diagnosis not present

## 2021-04-22 DIAGNOSIS — M85852 Other specified disorders of bone density and structure, left thigh: Secondary | ICD-10-CM | POA: Diagnosis not present

## 2021-04-22 DIAGNOSIS — M810 Age-related osteoporosis without current pathological fracture: Secondary | ICD-10-CM | POA: Diagnosis not present

## 2021-04-22 DIAGNOSIS — M85851 Other specified disorders of bone density and structure, right thigh: Secondary | ICD-10-CM | POA: Diagnosis not present

## 2021-04-27 DIAGNOSIS — D225 Melanocytic nevi of trunk: Secondary | ICD-10-CM | POA: Diagnosis not present

## 2021-04-27 DIAGNOSIS — L570 Actinic keratosis: Secondary | ICD-10-CM | POA: Diagnosis not present

## 2021-04-27 DIAGNOSIS — X32XXXD Exposure to sunlight, subsequent encounter: Secondary | ICD-10-CM | POA: Diagnosis not present

## 2021-04-27 DIAGNOSIS — Z1283 Encounter for screening for malignant neoplasm of skin: Secondary | ICD-10-CM | POA: Diagnosis not present

## 2021-06-10 DIAGNOSIS — H40033 Anatomical narrow angle, bilateral: Secondary | ICD-10-CM | POA: Diagnosis not present

## 2021-06-10 DIAGNOSIS — E119 Type 2 diabetes mellitus without complications: Secondary | ICD-10-CM | POA: Diagnosis not present

## 2021-06-14 ENCOUNTER — Ambulatory Visit (INDEPENDENT_AMBULATORY_CARE_PROVIDER_SITE_OTHER): Payer: Medicare Other

## 2021-06-14 DIAGNOSIS — I495 Sick sinus syndrome: Secondary | ICD-10-CM | POA: Diagnosis not present

## 2021-06-14 LAB — CUP PACEART REMOTE DEVICE CHECK
Battery Remaining Longevity: 44 mo
Battery Remaining Percentage: 33 %
Battery Voltage: 2.89 V
Brady Statistic RV Percent Paced: 90 %
Date Time Interrogation Session: 20221003092000
Implantable Lead Implant Date: 20130801
Implantable Lead Implant Date: 20130801
Implantable Lead Location: 753859
Implantable Lead Location: 753860
Implantable Lead Model: 1948
Implantable Pulse Generator Implant Date: 20130801
Lead Channel Impedance Value: 550 Ohm
Lead Channel Pacing Threshold Amplitude: 0.75 V
Lead Channel Pacing Threshold Pulse Width: 0.4 ms
Lead Channel Sensing Intrinsic Amplitude: 9.2 mV
Lead Channel Setting Pacing Amplitude: 2.5 V
Lead Channel Setting Pacing Pulse Width: 0.4 ms
Lead Channel Setting Sensing Sensitivity: 2 mV
Pulse Gen Model: 2210
Pulse Gen Serial Number: 7377947

## 2021-06-18 ENCOUNTER — Ambulatory Visit (INDEPENDENT_AMBULATORY_CARE_PROVIDER_SITE_OTHER): Payer: Medicare Other | Admitting: Internal Medicine

## 2021-06-18 ENCOUNTER — Encounter: Payer: Self-pay | Admitting: Internal Medicine

## 2021-06-18 VITALS — BP 116/78 | HR 80 | Ht 71.0 in | Wt 195.0 lb

## 2021-06-18 DIAGNOSIS — I495 Sick sinus syndrome: Secondary | ICD-10-CM | POA: Diagnosis not present

## 2021-06-18 DIAGNOSIS — I10 Essential (primary) hypertension: Secondary | ICD-10-CM | POA: Diagnosis not present

## 2021-06-18 DIAGNOSIS — I4821 Permanent atrial fibrillation: Secondary | ICD-10-CM | POA: Diagnosis not present

## 2021-06-18 LAB — CUP PACEART INCLINIC DEVICE CHECK
Battery Remaining Longevity: 42 mo
Battery Voltage: 2.89 V
Brady Statistic RA Percent Paced: 0 %
Brady Statistic RV Percent Paced: 90 %
Date Time Interrogation Session: 20221007085629
Implantable Lead Implant Date: 20130801
Implantable Lead Implant Date: 20130801
Implantable Lead Location: 753859
Implantable Lead Location: 753860
Implantable Lead Model: 1948
Implantable Pulse Generator Implant Date: 20130801
Lead Channel Impedance Value: 562.5 Ohm
Lead Channel Pacing Threshold Amplitude: 1 V
Lead Channel Pacing Threshold Pulse Width: 0.4 ms
Lead Channel Sensing Intrinsic Amplitude: 10.2 mV
Lead Channel Sensing Intrinsic Amplitude: 3.6 mV
Lead Channel Setting Pacing Amplitude: 2.5 V
Lead Channel Setting Pacing Pulse Width: 0.4 ms
Lead Channel Setting Sensing Sensitivity: 2 mV
Pulse Gen Model: 2210
Pulse Gen Serial Number: 7377947

## 2021-06-18 NOTE — Progress Notes (Signed)
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  Since last being seen in our clinic, the patient reports doing very well.  Today, he denies symptoms of palpitations, chest pain, shortness of breath,  lower extremity edema, dizziness, presyncope, or syncope.  The patient is otherwise without complaint today.   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 implant by Dr [name] 04/12/12   Type 2 diabetes mellitus ([place])    Past Surgical History:  Procedure Laterality Date   CARDIAC CATHETERIZATION N/A 06/23/2016   Procedure: Left Heart Cath and Coronary Angiography;  Surgeon: Nelva Bush, MD;  Location: [facility];  Service: Cardiovascular;  Laterality: N/A;   CATARACT EXTRACTION, BILATERAL     COLONOSCOPY N/A 05/22/2019   Procedure: COLONOSCOPY;  Surgeon: Rogene Houston, MD;  [facility];  Service: Endoscopy;  Laterality: N/A;  930   ESOPHAGEAL DILATION  01/2012   ESOPHAGOGASTRODUODENOSCOPY (EGD) WITH PROPOFOL N/A 01/01/2021   Procedure: ESOPHAGOGASTRODUODENOSCOPY (EGD) WITH PROPOFOL;  Surgeon: Harvel Quale, MD;  [facility];  Service: Gastroenterology;  Laterality: N/A;  AM   HEMOSTASIS CLIP PLACEMENT  01/01/2021   Procedure: HEMOSTASIS CLIP PLACEMENT;  Surgeon:  Montez Morita, Quillian Quince, MD;  [facility];  Service: Gastroenterology;;   LACERATION REPAIR  ~ 1984   left forearm; "cut it w/a power saw"   MELANOMA EXCISION  ~ 2011   left posterior shoulder   PACEMAKER INSERTION  04/12/2012   SJM Acc[employer] DR RF pacemaker implanted by Dr [name] for tachy/brady syndrome   PERMANENT PACEMAKER INSERTION N/A 04/12/2012   Procedure: PERMANENT PACEMAKER INSERTION;  Surgeon: Thompson Grayer, MD;  Location: [facility];  Service: Cardiovascular;  Laterality: N/A;   POLYPECTOMY  05/22/2019   Procedure: POLYPECTOMY;  Surgeon: Rogene Houston, MD;  [facility];  Service: Endoscopy;;  transverse, cecum,ascending,    POLYPECTOMY  01/01/2021   Procedure: POLYPECTOMY;  Surgeon: Harvel Quale, MD;  [facility]: [facility];  Service: Gastroenterology;;  duodenal gastric    ROS- all systems are reviewed and negative except as per HPI above  Current Outpatient Medications  Medication Sig Dispense Refill   allopurinol (ZYLOPRIM) 300 MG tablet Take 300 mg by mouth daily.     Calcium Carb-Cholecalciferol 600-500 MG-UNIT CAPS Take 1 tablet by mouth daily.     Cholecalciferol (VITAMIN D) 125 MCG (5000 UT) CAPS Take 5,000 Units by mouth every evening.     clonazePAM (KLONOPIN) 1 MG tablet Take 1 mg by mouth at bedtime.     diltiazem (DILACOR XR) 180 MG 24 hr capsule TAKE TWO  CAPSULES EACH DAY (Patient taking differently: Take 360 mg by mouth daily.) 180 capsule 1   famotidine (PEPCID) 20 MG tablet Take 20 mg by mouth daily.     glipiZIDE (GLUCOTROL XL) 2.5 MG 24 hr tablet Take 2.5 mg by mouth daily.     lisinopril-hydrochlorothiazide (ZESTORETIC) 20-25 MG tablet Take 1 tablet by mouth daily.     metFORMIN (GLUCOPHAGE) 1000 MG tablet      Multiple Vitamins-Minerals (PRESERVISION AREDS 2) CAPS Take 2 capsules by mouth daily.     Omega-3 Fatty Acids (FISH OIL) 1000 MG CAPS Take 1,000-2,000 mg by mouth See admin instructions. Take 1000 mg in the morning  and 2000 mg at bedtime     omeprazole (PRILOSEC) 40 MG capsule Take 1 capsule (40 mg total) by mouth daily. Take it twice a day for 4 weeks, then once a day 90 capsule 3   rosuvastatin (CRESTOR) 5 MG tablet TAKE 1 TABLET EVERY MONDAY, WEDNESDAY, &FRIDAY (Patient taking differently: Take 5 mg by mouth every Monday, Wednesday, and Friday.) 38 tablet 2   warfarin (COUMADIN) 5 MG tablet Take 7.5 mg by mouth daily.     Zinc 50 MG TABS Take 50 mg by mouth daily.      Cinnamon 500 MG capsule Take 1,000 mg by mouth daily. (Patient not taking: Reported on 06/18/2021)     No current facility-administered medications for this visit.    Physical Exam: Vitals:   06/18/21 0839  BP: 116/78  Pulse: 80  SpO2: 100%  Weight: 195 lb (88.5 kg)  Height: 5\' 11"  (1.803 m)    GEN- The patient is well appearing, alert and oriented x 3 today.   Head- normocephalic, atraumatic Eyes-  Sclera clear, conjunctiva pink Ears- hearing intact Oropharynx- clear Lungs- Clear to ausculation bilaterally, normal work of breathing Chest- pacemaker pocket is well healed Heart- Regular rate and rhythm (paced) GI- soft, NT, ND, + BS Extremities- no clubbing, cyanosis, or edema  Pacemaker interrogation- reviewed in detail today,  See PACEART report  Echo 07/01/20- EF 50-55%, severe LA enlargement  ekg tracing ordered today is personally reviewed and shows afib, V paced  Assessment and Plan:  1. Symptomatic sinus bradycardia  Normal pacemaker function See Pace Art report No changes today he is not device dependant today  2. Permanent afib Rate controlled On coumadin with chads2vasc score of 4 We discussed [place] therapy today.  He will look into the cost of eliquis and consider making the change if not cost prohibitive.  3. HTN Stable No change required today  4. CAD No ischemic symptoms  5. HL Continue crestor 5mg  daily  Risks, benefits and potential toxicities for medications prescribed and/or refilled  reviewed with patient today.   Return in a year  Thompson Grayer MD, [facility] 06/18/2021 8:49 AM

## 2021-06-18 NOTE — Patient Instructions (Signed)
Medication Instructions:  Continue all current medications.  Labwork: none  Testing/Procedures: none  Follow-Up: 1 year   Any Other Special Instructions Will Be Listed Below (If Applicable).  If you need a refill on your cardiac medications before your next appointment, please call your pharmacy.  

## 2021-06-21 DIAGNOSIS — I4891 Unspecified atrial fibrillation: Secondary | ICD-10-CM | POA: Diagnosis not present

## 2021-06-21 NOTE — Progress Notes (Signed)
Remote pacemaker transmission.   

## 2021-07-09 DIAGNOSIS — Z23 Encounter for immunization: Secondary | ICD-10-CM | POA: Diagnosis not present

## 2021-07-20 DIAGNOSIS — Z7901 Long term (current) use of anticoagulants: Secondary | ICD-10-CM | POA: Diagnosis not present

## 2021-07-20 DIAGNOSIS — I10 Essential (primary) hypertension: Secondary | ICD-10-CM | POA: Diagnosis not present

## 2021-07-20 DIAGNOSIS — I4891 Unspecified atrial fibrillation: Secondary | ICD-10-CM | POA: Diagnosis not present

## 2021-07-20 DIAGNOSIS — E1142 Type 2 diabetes mellitus with diabetic polyneuropathy: Secondary | ICD-10-CM | POA: Diagnosis not present

## 2021-07-20 DIAGNOSIS — K2100 Gastro-esophageal reflux disease with esophagitis, without bleeding: Secondary | ICD-10-CM | POA: Diagnosis not present

## 2021-07-20 DIAGNOSIS — E782 Mixed hyperlipidemia: Secondary | ICD-10-CM | POA: Diagnosis not present

## 2021-07-20 DIAGNOSIS — M1009 Idiopathic gout, multiple sites: Secondary | ICD-10-CM | POA: Diagnosis not present

## 2021-08-16 DIAGNOSIS — I4891 Unspecified atrial fibrillation: Secondary | ICD-10-CM | POA: Diagnosis not present

## 2021-08-25 ENCOUNTER — Other Ambulatory Visit (INDEPENDENT_AMBULATORY_CARE_PROVIDER_SITE_OTHER): Payer: Self-pay | Admitting: Gastroenterology

## 2021-09-15 DIAGNOSIS — I10 Essential (primary) hypertension: Secondary | ICD-10-CM | POA: Diagnosis not present

## 2021-09-15 DIAGNOSIS — I4891 Unspecified atrial fibrillation: Secondary | ICD-10-CM | POA: Diagnosis not present

## 2021-09-15 DIAGNOSIS — M1009 Idiopathic gout, multiple sites: Secondary | ICD-10-CM | POA: Diagnosis not present

## 2021-09-15 DIAGNOSIS — K2100 Gastro-esophageal reflux disease with esophagitis, without bleeding: Secondary | ICD-10-CM | POA: Diagnosis not present

## 2021-09-15 DIAGNOSIS — E1142 Type 2 diabetes mellitus with diabetic polyneuropathy: Secondary | ICD-10-CM | POA: Diagnosis not present

## 2021-09-15 DIAGNOSIS — E782 Mixed hyperlipidemia: Secondary | ICD-10-CM | POA: Diagnosis not present

## 2021-09-17 ENCOUNTER — Ambulatory Visit (INDEPENDENT_AMBULATORY_CARE_PROVIDER_SITE_OTHER): Payer: Medicare Other

## 2021-09-17 DIAGNOSIS — I495 Sick sinus syndrome: Secondary | ICD-10-CM | POA: Diagnosis not present

## 2021-09-17 LAB — CUP PACEART REMOTE DEVICE CHECK
Battery Remaining Longevity: 41 mo
Battery Remaining Percentage: 31 %
Battery Voltage: 2.87 V
Brady Statistic RV Percent Paced: 96 %
Date Time Interrogation Session: 20230106020007
Implantable Lead Implant Date: 20130801
Implantable Lead Implant Date: 20130801
Implantable Lead Location: 753859
Implantable Lead Location: 753860
Implantable Lead Model: 1948
Implantable Pulse Generator Implant Date: 20130801
Lead Channel Impedance Value: 590 Ohm
Lead Channel Pacing Threshold Amplitude: 1 V
Lead Channel Pacing Threshold Pulse Width: 0.4 ms
Lead Channel Sensing Intrinsic Amplitude: 10.3 mV
Lead Channel Setting Pacing Amplitude: 2.5 V
Lead Channel Setting Pacing Pulse Width: 0.4 ms
Lead Channel Setting Sensing Sensitivity: 2 mV
Pulse Gen Model: 2210
Pulse Gen Serial Number: 7377947

## 2021-09-20 ENCOUNTER — Telehealth: Payer: Self-pay | Admitting: Internal Medicine

## 2021-09-20 NOTE — Telephone Encounter (Signed)
°  1. Has your device fired? no ° °2. Is you device beeping? no ° °3. Are you experiencing draining or swelling at device site?no ° °4. Are you calling to see if we received your device transmission?yes ° °5. Have you passed out? no ° ° ° °Please route to Device Clinic Pool °

## 2021-09-20 NOTE — Telephone Encounter (Signed)
Advised patient transmission was received.  Advised transmissions come through automatically when scheduled.

## 2021-09-27 NOTE — Progress Notes (Signed)
Remote pacemaker transmission.   

## 2021-10-21 DIAGNOSIS — K2100 Gastro-esophageal reflux disease with esophagitis, without bleeding: Secondary | ICD-10-CM | POA: Diagnosis not present

## 2021-10-21 DIAGNOSIS — I4891 Unspecified atrial fibrillation: Secondary | ICD-10-CM | POA: Diagnosis not present

## 2021-10-21 DIAGNOSIS — Z0000 Encounter for general adult medical examination without abnormal findings: Secondary | ICD-10-CM | POA: Diagnosis not present

## 2021-10-21 DIAGNOSIS — E1142 Type 2 diabetes mellitus with diabetic polyneuropathy: Secondary | ICD-10-CM | POA: Diagnosis not present

## 2021-10-21 DIAGNOSIS — Z1331 Encounter for screening for depression: Secondary | ICD-10-CM | POA: Diagnosis not present

## 2021-10-21 DIAGNOSIS — I10 Essential (primary) hypertension: Secondary | ICD-10-CM | POA: Diagnosis not present

## 2021-10-21 DIAGNOSIS — Z125 Encounter for screening for malignant neoplasm of prostate: Secondary | ICD-10-CM | POA: Diagnosis not present

## 2021-10-21 DIAGNOSIS — E782 Mixed hyperlipidemia: Secondary | ICD-10-CM | POA: Diagnosis not present

## 2021-10-21 DIAGNOSIS — M1009 Idiopathic gout, multiple sites: Secondary | ICD-10-CM | POA: Diagnosis not present

## 2021-11-02 DIAGNOSIS — D485 Neoplasm of uncertain behavior of skin: Secondary | ICD-10-CM | POA: Diagnosis not present

## 2021-11-02 DIAGNOSIS — D2371 Other benign neoplasm of skin of right lower limb, including hip: Secondary | ICD-10-CM | POA: Diagnosis not present

## 2021-11-02 DIAGNOSIS — D225 Melanocytic nevi of trunk: Secondary | ICD-10-CM | POA: Diagnosis not present

## 2021-11-02 DIAGNOSIS — Z1283 Encounter for screening for malignant neoplasm of skin: Secondary | ICD-10-CM | POA: Diagnosis not present

## 2021-11-10 DIAGNOSIS — I4891 Unspecified atrial fibrillation: Secondary | ICD-10-CM | POA: Diagnosis not present

## 2021-11-30 DIAGNOSIS — L820 Inflamed seborrheic keratosis: Secondary | ICD-10-CM | POA: Diagnosis not present

## 2021-11-30 DIAGNOSIS — L570 Actinic keratosis: Secondary | ICD-10-CM | POA: Diagnosis not present

## 2021-11-30 DIAGNOSIS — X32XXXD Exposure to sunlight, subsequent encounter: Secondary | ICD-10-CM | POA: Diagnosis not present

## 2021-12-06 ENCOUNTER — Encounter (INDEPENDENT_AMBULATORY_CARE_PROVIDER_SITE_OTHER): Payer: Self-pay | Admitting: *Deleted

## 2021-12-13 ENCOUNTER — Ambulatory Visit (INDEPENDENT_AMBULATORY_CARE_PROVIDER_SITE_OTHER): Payer: Medicare Other

## 2021-12-13 DIAGNOSIS — I495 Sick sinus syndrome: Secondary | ICD-10-CM | POA: Diagnosis not present

## 2021-12-14 LAB — CUP PACEART REMOTE DEVICE CHECK
Battery Remaining Longevity: 38 mo
Battery Remaining Percentage: 29 %
Battery Voltage: 2.87 V
Brady Statistic RV Percent Paced: 97 %
Date Time Interrogation Session: 20230404091553
Implantable Lead Implant Date: 20130801
Implantable Lead Implant Date: 20130801
Implantable Lead Location: 753859
Implantable Lead Location: 753860
Implantable Lead Model: 1948
Implantable Pulse Generator Implant Date: 20130801
Lead Channel Impedance Value: 530 Ohm
Lead Channel Pacing Threshold Amplitude: 1 V
Lead Channel Pacing Threshold Pulse Width: 0.4 ms
Lead Channel Sensing Intrinsic Amplitude: 9.7 mV
Lead Channel Setting Pacing Amplitude: 2.5 V
Lead Channel Setting Pacing Pulse Width: 0.4 ms
Lead Channel Setting Sensing Sensitivity: 2 mV
Pulse Gen Model: 2210
Pulse Gen Serial Number: 7377947

## 2021-12-20 DIAGNOSIS — I4891 Unspecified atrial fibrillation: Secondary | ICD-10-CM | POA: Diagnosis not present

## 2021-12-22 ENCOUNTER — Other Ambulatory Visit (INDEPENDENT_AMBULATORY_CARE_PROVIDER_SITE_OTHER): Payer: Self-pay

## 2021-12-22 ENCOUNTER — Telehealth (INDEPENDENT_AMBULATORY_CARE_PROVIDER_SITE_OTHER): Payer: Self-pay

## 2021-12-22 ENCOUNTER — Encounter (INDEPENDENT_AMBULATORY_CARE_PROVIDER_SITE_OTHER): Payer: Self-pay

## 2021-12-22 DIAGNOSIS — Z85060 Personal history of malignant carcinoid tumor of small intestine: Secondary | ICD-10-CM

## 2021-12-22 NOTE — Telephone Encounter (Signed)
Ok to schedule.  Thanks,  Keitha Kolk Castaneda Mayorga, MD Gastroenterology and Hepatology  [facility]  

## 2021-12-22 NOTE — Telephone Encounter (Signed)
Referring MD/PCP: Hasanaj ? ?Procedure: Egd ? ?Reason/Indication:  Hx of Duodenal Carcinoid ? ?Has patient had this procedure before?  Yes  ? If so, when, by whom and where?  04/22 ? ?Is there a family history of colon cancer?  no ? Who?  What age when diagnosed?   ? ?Is patient diabetic? If yes, Type 1 or Type 2   yes type 2  ?     ?Does patient have prosthetic heart valve or mechanical valve?  no ? ?Do you have a pacemaker/defibrillator?  Yes, pacemaker ? ?Has patient ever had endocarditis/atrial fibrillation? Yes, atrial fib ? ?Does patient use oxygen? no ? ?Has patient had joint replacement within last 12 months?  no ? ?Is patient constipated or do they take laxatives? no ? ?Does patient have a history of alcohol/drug use?  yes ? ?Have you had a stroke/heart attack last 6 mths? no ? ?Do you take medicine for weight loss?  no ? ?For male patients,: have you had a hysterectomy N/A ?                     are you post menopausal N/A ?                     do you still have your menstrual cycle N/A ? ?Is patient on blood thinner such as Coumadin, Plavix and/or Aspirin? yes ? ?Medications: warfarin 5 mg (Sun,Mon, Tues, Thurs & Sat) and 7.5 mg (Wed & Fri) diltiazem 180 mg bid, metformin 1000 mg bid, omperprazole 40 mg daily, glipizide 2/5 mg daily, allopurinol 300 mg daily, famotidine 20 mg daily, rosuvastatin 5 mg (Mon, Wed & Fri) Olmesartan/hctz 20/12.5 mg daily, clonazepam 1 mg daily ? ?Allergies: pseudoephedrine ? ?Medication Adjustment per Dr Jenetta Downer Warfarin 5 days prior ? ?Procedure date & time: Tuesday 01/18/22 at 935 ? ? ?

## 2021-12-23 ENCOUNTER — Encounter (INDEPENDENT_AMBULATORY_CARE_PROVIDER_SITE_OTHER): Payer: Self-pay

## 2021-12-23 NOTE — Progress Notes (Signed)
Remote pacemaker transmission.   

## 2022-01-03 ENCOUNTER — Telehealth (INDEPENDENT_AMBULATORY_CARE_PROVIDER_SITE_OTHER): Payer: Self-pay

## 2022-01-03 NOTE — Telephone Encounter (Signed)
Per Dr Sherrie Sport it is ok for Paul Dunn to hold Warfarin 5 days prior to procedure but does require Lovenox Bridging and patient is aware ?

## 2022-01-03 NOTE — Telephone Encounter (Signed)
Ok thanks, Dr. Sherrie Sport will prescribe it right? ?

## 2022-01-03 NOTE — Telephone Encounter (Signed)
Yes Dr Sherrie Sport will prescribe the Lovenox ?

## 2022-01-03 NOTE — Telephone Encounter (Signed)
thanks

## 2022-01-13 NOTE — Patient Instructions (Addendum)
? ? Paul Dunn ? 01/13/2022  ?  ? '@PREFPERIOPPHARMACY'$ @ ? ? Your procedure is scheduled on 01/18/2022. ? Report to [facility] at 8:00 A.M. ? Call this number if you have problems the morning of surgery: ? [phone] ? ? Remember: ? ? Please follow the instructions given to you by Dr Colman Cater office ? ? Last dose of Coumadin should be the pm dose on 01/12/2022 ? ? Please follow the instructions given to you for the Lovenox bridge ? ? Do not take any diabetic medications the day of the procedure ? ? ? ?  ?                  ?Take these medicines the morning of surgery with A SIP OF WATER : Diltiazem, Prilosec and Pepcid ?  ? Do not wear jewelry, make-up or nail polish. ? Do not wear lotions, powders, or perfumes, or deodorant. ? Do not shave 48 hours prior to surgery.  Men may shave face and neck. ? Do not bring valuables to the hospital. ? [facility] is not responsible for any belongings or valuables. ? ?Contacts, dentures or bridgework may not be worn into surgery.  Leave your suitcase in the car.  After surgery it may be brought to your room. ? ?For patients admitted to the hospital, discharge time will be determined by your treatment team. ? ?Patients discharged the day of surgery will not be allowed to drive home.  ? ?Name and phone number of your driver:   Family ?Special instructions:  N/A ? ?Please read over the following fact sheets that you were given. ?Care and Recovery After Surgery ? ? ?Monitored Anesthesia Care ?Anesthesia refers to techniques, procedures, and medicines that help a person stay safe and comfortable during a medical or dental procedure. Monitored anesthesia care, or sedation, is one type of anesthesia. Your anesthesia specialist may recommend sedation if you will be having a procedure that does not require you to be unconscious. You may have this procedure for: ?Cataract surgery. ?A dental procedure. ?A biopsy. ?A colonoscopy. ?During the procedure, you may receive a medicine to help  you relax (sedative). There are three levels of sedation: ?Mild sedation. At this level, you may feel awake and relaxed. You will be able to follow directions. ?Moderate sedation. At this level, you will be sleepy. You may not remember the procedure. ?Deep sedation. At this level, you will be asleep. You will not remember the procedure. ?The more medicine you are given, the deeper your level of sedation will be. Depending on how you respond to the procedure, the anesthesia specialist may change your level of sedation or the type of anesthesia to fit your needs. An anesthesia specialist will monitor you closely during the procedure. ?Tell a health care provider about: ?Any allergies you have. ?All medicines you are taking, including vitamins, herbs, eye drops, creams, and over-the-counter medicines. ?Any problems you or family members have had with anesthetic medicines. ?Any blood disorders you have. ?Any surgeries you have had. ?Any medical conditions you have, such as sleep apnea. ?Whether you are pregnant or may be pregnant. ?Whether you use cigarettes, alcohol, or drugs. ?Any use of steroids, whether by mouth or as a cream. ?What are the risks? ?Generally, this is a safe procedure. However, problems may occur, including: ?Getting too much medicine (oversedation). ?Nausea. ?Allergic reaction to medicines. ?Trouble breathing. If this happens, a breathing tube may be used to help with breathing. It will be removed when you  are awake and breathing on your own. ?Heart trouble. ?Lung trouble. ?Confusion that gets better with time (emergence delirium). ?What happens before the procedure? ?Staying hydrated ?Follow instructions from your health care provider about hydration, which may include: ?Up to 2 hours before the procedure - you may continue to drink clear liquids, such as water, clear fruit juice, black coffee, and plain tea. ?Eating and drinking restrictions ?Follow instructions from your health care provider  about eating and drinking, which may include: ?8 hours before the procedure - stop eating heavy meals or foods, such as meat, fried foods, or fatty foods. ?6 hours before the procedure - stop eating light meals or foods, such as toast or cereal. ?6 hours before the procedure - stop drinking milk or drinks that contain milk. ?2 hours before the procedure - stop drinking clear liquids. ?Medicines ?Ask your health care provider about: ?Changing or stopping your regular medicines. This is especially important if you are taking diabetes medicines or blood thinners. ?Taking medicines such as aspirin and ibuprofen. These medicines can thin your blood. Do not take these medicines unless your health care provider tells you to take them. ?Taking over-the-counter medicines, vitamins, herbs, and supplements. ?Tests and exams ?You will have a physical exam. ?You may have blood tests done to show: ?How well your kidneys and liver are working. ?How well your blood can clot. ?General instructions ?Plan to have a responsible adult take you home from the hospital or clinic. ?If you will be going home right after the procedure, plan to have a responsible adult care for you for the time you are told. This is important. ?What happens during the procedure? ? ?Your blood pressure, heart rate, breathing, level of pain, and overall condition will be monitored. ?An IV will be inserted into one of your veins. ?You will be given medicines as needed to keep you comfortable during the procedure. This may mean changing the level of sedation. ?Depending on your age or the procedure, the sedative may be given: ?As a pill that you will swallow or as a pill that is inserted into the rectum. ?As an injection into the vein or muscle. ?As a spray through the nose. ?The procedure will be performed. ?Your breathing, heart rate, and blood pressure will be monitored during the procedure. ?When the procedure is over, the medicine will be stopped. ?The  procedure may vary among health care providers and hospitals. ?What happens after the procedure? ?Your blood pressure, heart rate, breathing rate, and blood oxygen level will be monitored until you leave the hospital or clinic. ?You may feel sleepy, clumsy, or nauseous. ?You may feel forgetful about what happened after the procedure. ?You may vomit. ?You may continue to get IV fluids. ?Do not drive or operate machinery until your health care provider says that it is safe. ?Summary ?Monitored anesthesia care is used to keep a patient comfortable during short procedures. ?Tell your health care provider about any allergies or health conditions you have and about all the medicines you are taking. ?Before the procedure, follow instructions about when to stop eating and drinking and about changing or stopping any medicines. ?Your blood pressure, heart rate, breathing rate, and blood oxygen level will be monitored until you leave the hospital or clinic. ?Plan to have a responsible adult take you home from the hospital or clinic. ?This information is not intended to replace advice given to you by your health care provider. Make sure you discuss any questions you have with your health  care provider. ?Document Revised: 08/03/2021 Document Reviewed: 08/01/2019 ?Elsevier Patient Education ? [place]. ?Upper Endoscopy, Adult ?Upper endoscopy is a procedure to look inside the upper GI (gastrointestinal) tract. The upper GI tract is made up of: ?The esophagus. This is the part of the body that moves food from your mouth to your stomach. ?The stomach. ?The duodenum. This is the first part of your small intestine. ?This procedure is also called esophagogastroduodenoscopy (EGD) or gastroscopy. In this procedure, your health care provider passes a thin, flexible tube (endoscope) through your mouth and down your esophagus into your stomach and into your duodenum. A small camera is attached to the end of the tube. Images from  the camera appear on a monitor in the exam room. During this procedure, your health care provider may also remove a small piece of tissue to be sent to a lab and examined under a microscope (biopsy). ?Your healt

## 2022-01-14 ENCOUNTER — Encounter (HOSPITAL_COMMUNITY)
Admission: RE | Admit: 2022-01-14 | Discharge: 2022-01-14 | Disposition: A | Discharge status: Home / Self Care | Payer: Medicare Other | Source: Ambulatory Visit | Attending: Gastroenterology | Admitting: Gastroenterology

## 2022-01-14 ENCOUNTER — Other Ambulatory Visit: Payer: Self-pay

## 2022-01-14 VITALS — BP 129/74 | HR 87 | Temp 97.6°F | Ht 71.0 in | Wt 190.0 lb

## 2022-01-14 DIAGNOSIS — E119 Type 2 diabetes mellitus without complications: Secondary | ICD-10-CM | POA: Diagnosis not present

## 2022-01-14 DIAGNOSIS — Z7901 Long term (current) use of anticoagulants: Secondary | ICD-10-CM | POA: Insufficient documentation

## 2022-01-14 DIAGNOSIS — Z01812 Encounter for preprocedural laboratory examination: Secondary | ICD-10-CM | POA: Insufficient documentation

## 2022-01-14 DIAGNOSIS — Z794 Long term (current) use of insulin: Secondary | ICD-10-CM | POA: Insufficient documentation

## 2022-01-14 LAB — BASIC METABOLIC PANEL
Anion gap: 8 (ref 5–15)
BUN: 19 mg/dL (ref 8–23)
CO2: 26 mmol/L (ref 22–32)
Calcium: 8.7 mg/dL — ABNORMAL LOW (ref 8.9–10.3)
Chloride: 105 mmol/L (ref 98–111)
Creatinine, Ser: 0.94 mg/dL (ref 0.61–1.24)
GFR, Estimated: >60 mL/min (ref >60)
Glucose, Bld: 175 mg/dL — ABNORMAL HIGH (ref 70–99)
Potassium: 4.1 mmol/L (ref 3.5–5.1)
Sodium: 139 mmol/L (ref 135–145)

## 2022-01-14 LAB — PROTIME-INR
INR: 2.4 — ABNORMAL HIGH (ref 0.8–1.2)
Prothrombin Time: 25.8 seconds — ABNORMAL HIGH (ref 11.4–15.2)

## 2022-01-18 ENCOUNTER — Ambulatory Visit (HOSPITAL_COMMUNITY): Payer: Medicare Other | Admitting: Anesthesiology

## 2022-01-18 ENCOUNTER — Ambulatory Visit (HOSPITAL_COMMUNITY)
Admission: RE | Admit: 2022-01-18 | Discharge: 2022-01-18 | Disposition: A | Discharge status: Home / Self Care | Payer: Medicare Other | Attending: Gastroenterology | Admitting: Gastroenterology

## 2022-01-18 ENCOUNTER — Encounter (HOSPITAL_COMMUNITY)
Admission: RE | Disposition: A | Discharge status: Home / Self Care | Payer: Self-pay | Source: Home / Self Care | Attending: Gastroenterology

## 2022-01-18 ENCOUNTER — Ambulatory Visit (HOSPITAL_BASED_OUTPATIENT_CLINIC_OR_DEPARTMENT_OTHER): Payer: Medicare Other | Admitting: Anesthesiology

## 2022-01-18 ENCOUNTER — Encounter (HOSPITAL_COMMUNITY): Payer: Self-pay | Admitting: Gastroenterology

## 2022-01-18 DIAGNOSIS — Z79899 Other long term (current) drug therapy: Secondary | ICD-10-CM | POA: Insufficient documentation

## 2022-01-18 DIAGNOSIS — I4891 Unspecified atrial fibrillation: Secondary | ICD-10-CM | POA: Diagnosis not present

## 2022-01-18 DIAGNOSIS — Z87891 Personal history of nicotine dependence: Secondary | ICD-10-CM | POA: Insufficient documentation

## 2022-01-18 DIAGNOSIS — K219 Gastro-esophageal reflux disease without esophagitis: Secondary | ICD-10-CM | POA: Insufficient documentation

## 2022-01-18 DIAGNOSIS — E1151 Type 2 diabetes mellitus with diabetic peripheral angiopathy without gangrene: Secondary | ICD-10-CM

## 2022-01-18 DIAGNOSIS — I10 Essential (primary) hypertension: Secondary | ICD-10-CM | POA: Insufficient documentation

## 2022-01-18 DIAGNOSIS — J45909 Unspecified asthma, uncomplicated: Secondary | ICD-10-CM | POA: Diagnosis not present

## 2022-01-18 DIAGNOSIS — Z950 Presence of cardiac pacemaker: Secondary | ICD-10-CM | POA: Insufficient documentation

## 2022-01-18 DIAGNOSIS — K449 Diaphragmatic hernia without obstruction or gangrene: Secondary | ICD-10-CM | POA: Diagnosis not present

## 2022-01-18 DIAGNOSIS — I495 Sick sinus syndrome: Secondary | ICD-10-CM

## 2022-01-18 DIAGNOSIS — Z85060 Personal history of malignant carcinoid tumor of small intestine: Secondary | ICD-10-CM

## 2022-01-18 DIAGNOSIS — K3189 Other diseases of stomach and duodenum: Secondary | ICD-10-CM | POA: Insufficient documentation

## 2022-01-18 DIAGNOSIS — N289 Disorder of kidney and ureter, unspecified: Secondary | ICD-10-CM | POA: Diagnosis not present

## 2022-01-18 DIAGNOSIS — K317 Polyp of stomach and duodenum: Secondary | ICD-10-CM

## 2022-01-18 DIAGNOSIS — K922 Gastrointestinal hemorrhage, unspecified: Secondary | ICD-10-CM | POA: Diagnosis not present

## 2022-01-18 HISTORY — PX: BIOPSY: SHX5522

## 2022-01-18 HISTORY — PX: ESOPHAGOGASTRODUODENOSCOPY (EGD) WITH PROPOFOL: SHX5813

## 2022-01-18 LAB — PROTIME-INR
INR: 1 (ref 0.8–1.2)
Prothrombin Time: 13.3 seconds (ref 11.4–15.2)

## 2022-01-18 LAB — GLUCOSE, CAPILLARY: Glucose-Capillary: 97 mg/dL (ref 70–99)

## 2022-01-18 SURGERY — ESOPHAGOGASTRODUODENOSCOPY (EGD) WITH PROPOFOL
Anesthesia: General

## 2022-01-18 MED ORDER — LACTATED RINGERS IV SOLN: INTRAVENOUS | Status: DC

## 2022-01-18 MED ORDER — FAMOTIDINE 40 MG PO TABS: 40.0000 mg | ORAL_TABLET | Freq: Every evening | ORAL | 3 refills | Status: DC

## 2022-01-18 MED ORDER — PROPOFOL 10 MG/ML IV BOLUS
INTRAVENOUS | Status: DC | PRN
  Administered 2022-01-18: 20 mg via INTRAVENOUS
  Administered 2022-01-18: 50 mg via INTRAVENOUS
  Administered 2022-01-18: 30 mg via INTRAVENOUS
  Administered 2022-01-18: 50 mg via INTRAVENOUS
  Administered 2022-01-18: 10 mg via INTRAVENOUS
  Administered 2022-01-18: 30 mg via INTRAVENOUS

## 2022-01-18 MED ORDER — PROPOFOL 10 MG/ML IV BOLUS
INTRAVENOUS | Status: AC
  Filled 2022-01-18: qty 20

## 2022-01-18 MED ORDER — STERILE WATER FOR IRRIGATION IR SOLN
Status: DC | PRN
Start: 2022-01-18 — End: 2022-01-18
  Administered 2022-01-18: 120 mL

## 2022-01-18 NOTE — Anesthesia Preprocedure Evaluation (Signed)
Anesthesia Evaluation  ?Patient identified by MRN, date of birth, ID band ?Patient awake ? ? ? ?Reviewed: ?Allergy & Precautions, H&P , NPO status , Patient's Chart, lab work & pertinent test results, reviewed documented beta blocker date and time  ? ?Airway ?Mallampati: II ? ?TM Distance: >3 FB ?Neck ROM: full ? ? ? Dental ?no notable dental hx. ? ?  ?Pulmonary ?asthma , former smoker,  ?  ?Pulmonary exam normal ?breath sounds clear to auscultation ? ? ? ? ? ? Cardiovascular ?Exercise Tolerance: Good ?hypertension, + Peripheral Vascular Disease  ?+ dysrhythmias Atrial Fibrillation + pacemaker  ?Rhythm:irregular Rate:Normal ? ? ?  ?Neuro/Psych ?negative neurological ROS ? negative psych ROS  ? GI/Hepatic ?Neg liver ROS, hiatal hernia, GERD  Medicated,  ?Endo/Other  ?negative endocrine ROSdiabetes, Type 2 ? Renal/GU ?Renal disease  ?negative genitourinary ?  ?Musculoskeletal ? ? Abdominal ?  ?Peds ? Hematology ?negative hematology ROS ?(+)   ?Anesthesia Other Findings ? ? Reproductive/Obstetrics ?negative OB ROS ? ?  ? ? ? ? ? ? ? ? ? ? ? ? ? ?  ?  ? ? ? ? ? ? ? ? ?Anesthesia Physical ?Anesthesia Plan ? ?ASA: 3 ? ?Anesthesia Plan: General  ? ?Post-op Pain Management:   ? ?Induction:  ? ?PONV Risk Score and Plan: Propofol infusion ? ?Airway Management Planned:  ? ?Additional Equipment:  ? ?Intra-op Plan:  ? ?Post-operative Plan:  ? ?Informed Consent: I have reviewed the patients History and Physical, chart, labs and discussed the procedure including the risks, benefits and alternatives for the proposed anesthesia with the patient or authorized representative who has indicated his/her understanding and acceptance.  ? ? ? ?Dental Advisory Given ? ?Plan Discussed with: CRNA ? ?Anesthesia Plan Comments:   ? ? ? ? ? ? ?Anesthesia Quick Evaluation ? ?

## 2022-01-18 NOTE — Op Note (Addendum)
[facility] ?Patient Name: Paul Dunn ?Procedure Date: 01/18/2022 9:24 AM ?MRN: [id number] ?Date of Birth: [birth year] ?Attending MD: Maylon Peppers ,  ?CSN: [id number] ?Age: 71 ?Admit Type: Outpatient ?Procedure:                Upper GI endoscopy ?Indications:              Follow-up of gastro-esophageal reflux disease ?Providers:                Maylon Peppers, Caprice Kluver, Rosina Lowenstein, RN ?Referring MD:              ?Medicines:                Monitored Anesthesia Care ?Complications:            No immediate complications. ?Estimated Blood Loss:     Estimated blood loss: none. ?Procedure:                Pre-Anesthesia Assessment: ?                          - Prior to the procedure, a History and Physical  ?                          was performed, and patient medications, allergies  ?                          and sensitivities were reviewed. The patient's  ?                          tolerance of previous anesthesia was reviewed. ?                          - The risks and benefits of the procedure and the  ?                          sedation options and risks were discussed with the  ?                          patient. All questions were answered and informed  ?                          consent was obtained. ?                          - ASA Grade Assessment: II - A patient with mild  ?                          systemic disease. ?                          After obtaining informed consent, the endoscope was  ?                          passed under direct vision. Throughout the  ?                          procedure, the patient's  blood pressure, pulse, and  ?                          oxygen saturations were monitored continuously. The  ?                          GIF-H190 ([id number]) scope was introduced through the  ?                          mouth, and advanced to the second part of duodenum.  ?                          The upper GI endoscopy was accomplished without  ?                          difficulty. The  patient tolerated the procedure  ?                          well. ?Scope In: 9:35:51 AM ?Scope Out: 9:46:32 AM ?Total Procedure Duration: 0 hours 10 minutes 41 seconds  ?Findings: ?     The examined esophagus was normal. ?     The Z-line was regular and was found 40 cm from the incisors. ?     Two small sessile polyps were found in the gastric body. Biopsies were  ?     taken with a cold forceps for histology. ?     A 3 mm post polypectomy scar was found in the duodenal bulb. This was  ?     located at 12 position, at the area where NET was previously located.  ?     The scar tissue was healthy in appearance. Biopsies were taken with a  ?     cold forceps for histology. ?     The exam of the duodenum was otherwise normal. ?Impression:               - Normal esophagus. ?                          - Z-line regular, 40 cm from the incisors. ?                          - Two gastric polyps. Biopsied. ?                          - Duodenal scar. Biopsied. ?Moderate Sedation: ?     Per Anesthesia Care ?Recommendation:           - Discharge patient to home (ambulatory). ?                          - Resume previous diet. ?                          - Await pathology results. ?                          - Continue omeprazole 40 mg at day and famotidine  ?  40 mg at night. ?                          - Restart warfarin tonight. ?                          - Will check chromogranin A, if elevated will need  ?                          to proceed with CT abdomen and pelvis with IV  ?                          contrast ?Procedure Code(s):        --- Professional --- ?                          69678, Esophagogastroduodenoscopy, flexible,  ?                          transoral; with biopsy, single or multiple ?Diagnosis Code(s):        --- Professional --- ?                          K31.7, Polyp of stomach and duodenum ?                          K31.89, Other diseases of stomach and duodenum ?                           K21.9, Gastro-esophageal reflux disease without  ?                          esophagitis ?CPT copyright 2019 [employer] Medical Association. All rights reserved. ?The codes documented in this report are preliminary and upon coder review may  ?be revised to meet current compliance requirements. ?Maylon Peppers, MD ?Maylon Peppers,  ?01/18/2022 9:53:17 AM ?This report has been signed electronically. ?Number of Addenda: 0 ?

## 2022-01-18 NOTE — Anesthesia Postprocedure Evaluation (Signed)
Anesthesia Post Note ? ?Patient: Paul Dunn ? ?Procedure(s) Performed: ESOPHAGOGASTRODUODENOSCOPY (EGD) WITH PROPOFOL ?BIOPSY ? ?Patient location during evaluation: Endoscopy ?Anesthesia Type: General ?Level of consciousness: awake ?Pain management: pain level controlled ?Vital Signs Assessment: post-procedure vital signs reviewed and stable ?Respiratory status: spontaneous breathing ?Cardiovascular status: blood pressure returned to baseline and stable ?Anesthetic complications: no ? ? ?No notable events documented. ? ? ?Last Vitals:  ?Vitals:  ? 01/18/22 0834 01/18/22 0954  ?BP: 131/78 (!) 93/55  ?Pulse: (!) 58 63  ?Resp: 15   ?Temp: 36.5 ?C (!) 36.4 ?C  ?SpO2: 96% 98%  ?  ?Last Pain:  ?Vitals:  ? 01/18/22 0954  ?TempSrc: Oral  ?PainSc: 0-No pain  ? ? ?  ?  ?  ?  ?  ?  ? ?Trudi Morgenthaler ? ? ? ? ?

## 2022-01-18 NOTE — Discharge Instructions (Signed)
You are being discharged to home.  ?Resume your previous diet.  ?We are waiting for your pathology results.  ?Continue omeprazole 40 mg at day and famotidine 40 mg at night. ?

## 2022-01-18 NOTE — Transfer of Care (Signed)
Immediate Anesthesia Transfer of Care Note ? ?Patient: Paul Dunn ? ?Procedure(s) Performed: ESOPHAGOGASTRODUODENOSCOPY (EGD) WITH PROPOFOL ?BIOPSY ? ?Patient Location: Short Stay ? ?Anesthesia Type:General ? ?Level of Consciousness: awake ? ?Airway & Oxygen Therapy: Patient Spontanous Breathing ? ?Post-op Assessment: Report given to RN ? ?Post vital signs: Reviewed ? ?Last Vitals:  ?Vitals Value Taken Time  ?BP    ?Temp    ?Pulse    ?Resp    ?SpO2    ? ? ?Last Pain:  ?Vitals:  ? 01/18/22 0931  ?PainSc: 0-No pain  ?   ? ?  ? ?Complications: No notable events documented. ?

## 2022-01-18 NOTE — H&P (Signed)
Paul Dunn is an 71 y.o. male.   ?Chief Complaint: duodenal carcinoid follow up and GERD ?HPI: Paul Dunn is a 71 y.o. 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 ? ?Patient has been taking protonix 40 but he has been presenting recurrent heartburn.  No nausea, vomiting, fever or chills, no dysphagia or odynophagia. ? 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  He was not interested in proceeding with a surgical referral at that time and decided to proceed with endoscopic surveillance ? 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 ? ?Past Medical History:  ?Diagnosis Date  ? Asthma   ? "when I was a child"  ? Atrial fibrillation ([place])   ? long standing persistent  ? Bulging of intervertebral disc between L4 and L5   ? GERD (gastroesophageal reflux disease)   ? Gouty arthritis   ? H/O hiatal hernia   ? High cholesterol   ? History of blood transfusion ~ 1984  ? History of bronchitis   ? "used to get it twice/year; last time was ~ 2 yr ago" (04/12/2012)  ? HTN (hypertension)   ? Kidney cysts   ? "never treated it"  ? Macular degeneration   ? Peripheral vascular disease ([place])   ? Pulmonary nodule   ? Skin cancer   ? Tachycardia-bradycardia syndrome [facility])   ? s/p PPM implant by Dr [name] 04/12/12  ? Type 2 diabetes mellitus ([place])   ? ? ?Past Surgical History:  ?Procedure Laterality Date  ? CARDIAC CATHETERIZATION N/A 06/23/2016  ? Procedure: Left Heart Cath and Coronary Angiography;  Surgeon: Nelva Bush, MD;  Location: [place] [facility];  Service: Cardiovascular;  Laterality: N/A;  ? CATARACT EXTRACTION, BILATERAL    ? COLONOSCOPY N/A 05/22/2019  ? Procedure: COLONOSCOPY;  Surgeon: Rogene Houston, MD;  [facility];  Service: Endoscopy;  Laterality: N/A;  930  ? ESOPHAGEAL DILATION  01/2012  ? ESOPHAGOGASTRODUODENOSCOPY (EGD) WITH PROPOFOL N/A 01/01/2021  ? Procedure: ESOPHAGOGASTRODUODENOSCOPY (EGD) WITH PROPOFOL;  Surgeon: Harvel Quale, MD;  Location[facility];  Service: Gastroenterology;  Laterality: N/A;  AM  ? HEMOSTASIS CLIP PLACEMENT  01/01/2021  ? Procedure: HEMOSTASIS CLIP PLACEMENT;  Surgeon: Montez Morita, Quillian Quince, MD;  Location: [facility];  Service: Gastroenterology;;  ? [place]  ~ 1984  ? left forearm; "cut it w/a power saw"  ? MELANOMA EXCISION  ~ 2011  ? left posterior shoulder  ? PACEMAKER INSERTION  04/12/2012  ? SJM Acc[employer] DR RF pacemaker implanted by Dr [name] for tachy/brady syndrome  ? PERMANENT PACEMAKER INSERTION N/A 04/12/2012  ? Procedure: PERMANENT PACEMAKER INSERTION;  Surgeon: Thompson Grayer, MD;  Location: [facility] C[facility] LAB;  Service: Cardiovascular;  Laterality: N/A;  ? POLYPECTOMY  05/22/2019  ? Procedure: POLYPECTOMY;  Surgeon: Rogene Houston, MD;  [facility];  Service: Endoscopy;;  transverse, cecum,ascending,   ? POLYPECTOMY  01/01/2021  ? Procedure: POLYPECTOMY;  Surgeon: Harvel Quale, MD;  Location: [facility];  Service: Gastroenterology;;  duodenal ?gastric  ? ? ?Family History  ?Problem Relation Age of Onset  ? Ulcers Mother   ? Lung cancer Father   ? ?Social History:  reports that he quit smoking about 30 years ago. His smoking use included cigarettes. He started  smoking about 32 years ago. He has a 2.00 pack-year smoking history. He quit smokeless tobacco use about 25 years ago.  His smokeless tobacco use included chew. He reports that he does not drink alcohol and does not use drugs. ? ?Allergies:  ?Allergies  ?Allergen Reactions  ? Shellfish Allergy Nausea And Vomiting  ? Sudafed [Pseudoephedrine] Other (See Comments)  ?  Unknown reaction ?  ? ? ?Medications Prior to Admission  ?Medication Sig Dispense Refill  ? allopurinol  (ZYLOPRIM) 300 MG tablet Take 300 mg by mouth in the morning.    ? Calcium Carb-Cholecalciferol 600-500 MG-UNIT CAPS Take 1 tablet by mouth in the morning.    ? Carboxymeth-Glyc-Polysorb PF (REFRESH OPTIVE MEGA-3) 0.5-1-0.5 % SOLN Place 1 drop into both eyes in the morning.    ? Cholecalciferol (VITAMIN D) 125 MCG (5000 UT) CAPS Take 5,000 Units by mouth every evening.    ? clonazePAM (KLONOPIN) 1 MG tablet Take 1 mg by mouth at bedtime.    ? diltiazem (CARDIZEM CD) 180 MG 24 hr capsule Take 360 mg by mouth in the morning.    ? famotidine (PEPCID) 20 MG tablet Take 20 mg by mouth daily as needed for indigestion or heartburn.    ? glipiZIDE (GLUCOTROL XL) 2.5 MG 24 hr tablet Take 2.5 mg by mouth in the morning.    ? metFORMIN (GLUCOPHAGE) 1000 MG tablet Take 1,000 mg by mouth in the morning and at bedtime.    ? Multiple Vitamins-Minerals (PRESERVISION AREDS 2) CAPS Take 1 capsule by mouth in the morning and at bedtime.    ? olmesartan-hydrochlorothiazide (BENICAR HCT) 20-12.5 MG tablet Take 1 tablet by mouth in the morning.    ? Omega-3 Fatty Acids (FISH OIL) 1000 MG CAPS Take 1,000 mg by mouth in the morning and at bedtime. Take 1000 mg in the morning and 2000 mg at bedtime    ? omeprazole (PRILOSEC) 40 MG capsule Take one po daily. 90 capsule 3  ? rosuvastatin (CRESTOR) 5 MG tablet TAKE 1 TABLET EVERY MONDAY, WEDNESDAY, &FRIDAY (Patient taking differently: Take 5 mg by mouth every Monday, Wednesday, and Friday at 8 PM.) 38 tablet 2  ? triamcinolone cream (KENALOG) 0.1 % Apply 1 application. topically 2 (two) times daily as needed (dry/irritated skin).    ? Zinc 50 MG TABS Take 50 mg by mouth in the morning.    ? enoxaparin (LOVENOX) 100 MG/ML injection Inject into the skin.    ? warfarin (COUMADIN) 5 MG tablet Take 7.5 mg by mouth daily. Take 1 tablet (5 mg) by mouth on Sundays, Mondays, Tuesdays, Thursdays & Saturdays in the evening. ?Take 1.5 tablets (7.5 mg) by mouth on Wednesdays & Fridays in the evening.     ? ? ?Results for orders placed or performed during the hospital encounter of 01/18/22 (from the past 48 hour(s))  ?Protime-INR     Status: None  ? Collection Time: 01/18/22  8:23 AM  ?Result Value Ref Range  ? Prothrombin Time 13.3 11.4 - 15.2 seconds  ? INR 1.0 0.8 - 1.2  ?  Comment: (NOTE) ?INR goal varies based on device and disease states. ?Performed at [facility], [address], [place], [place] [address] ?  ?Glucose, capillary     Status: None  ? Collection Time: 01/18/22  8:38 AM  ?Result Value Ref Range  ? Glucose-Capillary 97 70 - 99 mg/dL  ?  Comment: Glucose reference range applies only to samples taken after fasting for at least 8  hours.  ? ?No results found. ? ?Review of Systems  ?Constitutional: Negative.   ?HENT: Negative.    ?Eyes: Negative.   ?Respiratory: Negative.    ?Cardiovascular: Negative.   ?Gastrointestinal: Negative.   ?Endocrine: Negative.   ?Genitourinary: Negative.   ?Musculoskeletal: Negative.   ?Skin: Negative.   ?Allergic/Immunologic: Negative.   ?Neurological: Negative.   ?Hematological: Negative.   ?Psychiatric/Behavioral: Negative.    ? ?Blood pressure 131/78, pulse (!) 58, temperature 97.7 ?F (36.5 ?C), resp. rate 15, height '5\' 11"'$  (1.803 m), weight 86.2 kg, SpO2 96 %. ?Physical Exam  ?GENERAL: The patient is AO x3, in no acute distress. ?HEENT: Head is normocephalic and atraumatic. EOMI are intact. Mouth is well hydrated and without lesions. ?NECK: Supple. No masses ?LUNGS: Clear to auscultation. No presence of rhonchi/wheezing/rales. Adequate chest expansion ?HEART: RRR, normal s1 and s2. ?ABDOMEN: Soft, nontender, no guarding, no peritoneal signs, and nondistended. BS +. No masses. ?EXTREMITIES: Without any cyanosis, clubbing, rash, lesions or edema. ?NEUROLOGIC: AOx3, no focal motor deficit. ?SKIN: no jaundice, no rashes ? ?Assessment/Plan ?Paul Dunn is a 71 y.o. male with PMH asthma, atrial fibrillation, GERD, hyperlipidemia, hypertension tachycardia-bradycardia  syndrome status post pacemaker placement, type 2 diabetes, peripheral vascular disease, for follow of GERD and duodenal NET.  We will proceed with EGD. ? ?Harvel Quale, MD ?01/18/2022, 8:54 AM ? ? ? ?

## 2022-01-19 LAB — SURGICAL PATHOLOGY

## 2022-01-20 LAB — CHROMOGRANIN A: Chromogranin A (ng/mL): 214.5 ng/mL — ABNORMAL HIGH (ref 0.0–101.8)

## 2022-01-21 ENCOUNTER — Other Ambulatory Visit (INDEPENDENT_AMBULATORY_CARE_PROVIDER_SITE_OTHER): Payer: Self-pay

## 2022-01-21 ENCOUNTER — Telehealth (INDEPENDENT_AMBULATORY_CARE_PROVIDER_SITE_OTHER): Payer: Self-pay | Admitting: *Deleted

## 2022-01-21 DIAGNOSIS — Z85060 Personal history of malignant carcinoid tumor of small intestine: Secondary | ICD-10-CM

## 2022-01-21 DIAGNOSIS — E340 Carcinoid syndrome: Secondary | ICD-10-CM

## 2022-01-21 NOTE — Telephone Encounter (Signed)
Called and discussed with patient per Dr. Jenetta Downer - Please let the patient we will need to do some other investigations with other imaging.  ? schedule a CT abdomen pelvis with IV contrast and PET GaDOTATE DX: duodenal carcinoid.   ?Pt verbalized understanding of all and cannot do may 16th and would like to try to do both on same day if possible.  ?

## 2022-01-21 NOTE — Telephone Encounter (Signed)
Patient walked in office today and was concerned about lab test result he saw on mychart. Chromogranin A. I let patient know doctor has not signed off on test results yet and I would send a message back to Dr. Jenetta Downer ( he is aware no provider in office today and that Dr. Jenetta Downer is out of office next week. And I would check to see if lab had been signed off on on Monday and if not I would send to [facility] to review)  ?

## 2022-01-24 ENCOUNTER — Other Ambulatory Visit (INDEPENDENT_AMBULATORY_CARE_PROVIDER_SITE_OTHER): Payer: Self-pay

## 2022-01-24 DIAGNOSIS — E340 Carcinoid syndrome: Secondary | ICD-10-CM

## 2022-01-24 NOTE — Progress Notes (Unsigned)
n

## 2022-01-25 ENCOUNTER — Encounter (HOSPITAL_COMMUNITY): Payer: Self-pay | Admitting: Gastroenterology

## 2022-01-25 DIAGNOSIS — I4891 Unspecified atrial fibrillation: Secondary | ICD-10-CM | POA: Diagnosis not present

## 2022-01-25 DIAGNOSIS — E782 Mixed hyperlipidemia: Secondary | ICD-10-CM | POA: Diagnosis not present

## 2022-01-25 DIAGNOSIS — M1009 Idiopathic gout, multiple sites: Secondary | ICD-10-CM | POA: Diagnosis not present

## 2022-01-25 DIAGNOSIS — I10 Essential (primary) hypertension: Secondary | ICD-10-CM | POA: Diagnosis not present

## 2022-01-25 DIAGNOSIS — E1142 Type 2 diabetes mellitus with diabetic polyneuropathy: Secondary | ICD-10-CM | POA: Diagnosis not present

## 2022-01-25 DIAGNOSIS — K2100 Gastro-esophageal reflux disease with esophagitis, without bleeding: Secondary | ICD-10-CM | POA: Diagnosis not present

## 2022-02-03 DIAGNOSIS — I4891 Unspecified atrial fibrillation: Secondary | ICD-10-CM | POA: Diagnosis not present

## 2022-02-09 ENCOUNTER — Ambulatory Visit (HOSPITAL_COMMUNITY)
Admission: RE | Admit: 2022-02-09 | Discharge: 2022-02-09 | Disposition: A | Discharge status: Home / Self Care | Payer: Medicare Other | Source: Ambulatory Visit | Attending: Gastroenterology | Admitting: Gastroenterology

## 2022-02-09 ENCOUNTER — Encounter (HOSPITAL_COMMUNITY)
Admission: RE | Admit: 2022-02-09 | Discharge: 2022-02-09 | Disposition: A | Discharge status: Home / Self Care | Payer: Medicare Other | Source: Ambulatory Visit | Attending: Gastroenterology | Admitting: Gastroenterology

## 2022-02-09 DIAGNOSIS — I2510 Atherosclerotic heart disease of native coronary artery without angina pectoris: Secondary | ICD-10-CM | POA: Insufficient documentation

## 2022-02-09 DIAGNOSIS — Z85060 Personal history of malignant carcinoid tumor of small intestine: Secondary | ICD-10-CM | POA: Diagnosis not present

## 2022-02-09 DIAGNOSIS — E340 Carcinoid syndrome: Secondary | ICD-10-CM

## 2022-02-09 DIAGNOSIS — I700 Atherosclerosis of aorta: Secondary | ICD-10-CM | POA: Insufficient documentation

## 2022-02-09 DIAGNOSIS — Z950 Presence of cardiac pacemaker: Secondary | ICD-10-CM | POA: Diagnosis not present

## 2022-02-09 DIAGNOSIS — K7689 Other specified diseases of liver: Secondary | ICD-10-CM | POA: Diagnosis not present

## 2022-02-09 DIAGNOSIS — C7A010 Malignant carcinoid tumor of the duodenum: Secondary | ICD-10-CM | POA: Diagnosis not present

## 2022-02-09 MED ORDER — COPPER CU 64 DOTATATE 1 MCI/ML IV SOLN
4.0000 | Freq: Once | INTRAVENOUS | Status: AC
  Administered 2022-02-09: 4 via INTRAVENOUS

## 2022-02-09 MED ORDER — IOHEXOL 9 MG/ML PO SOLN
500.0000 mL | ORAL | Status: AC
  Administered 2022-02-09 (×2): 500 mL via ORAL

## 2022-02-09 MED ORDER — IOHEXOL 300 MG/ML  SOLN
100.0000 mL | Freq: Once | INTRAMUSCULAR | Status: AC | PRN
  Administered 2022-02-09: 100 mL via INTRAVENOUS

## 2022-02-09 MED ORDER — IOHEXOL 9 MG/ML PO SOLN
ORAL | Status: AC
  Filled 2022-02-09: qty 1000

## 2022-02-11 ENCOUNTER — Other Ambulatory Visit (INDEPENDENT_AMBULATORY_CARE_PROVIDER_SITE_OTHER): Payer: Self-pay | Admitting: Gastroenterology

## 2022-02-11 DIAGNOSIS — R932 Abnormal findings on diagnostic imaging of liver and biliary tract: Secondary | ICD-10-CM

## 2022-02-14 ENCOUNTER — Telehealth: Payer: Self-pay | Admitting: Internal Medicine

## 2022-02-14 NOTE — Telephone Encounter (Signed)
Calling asking that dr allred review his CT scan notes. Please advise

## 2022-02-16 ENCOUNTER — Encounter: Payer: Self-pay | Admitting: Cardiovascular Disease

## 2022-02-16 ENCOUNTER — Encounter: Payer: Self-pay | Admitting: *Deleted

## 2022-02-16 ENCOUNTER — Ambulatory Visit (INDEPENDENT_AMBULATORY_CARE_PROVIDER_SITE_OTHER): Payer: Medicare Other | Admitting: Cardiovascular Disease

## 2022-02-16 VITALS — BP 134/72 | HR 64 | Ht 71.0 in | Wt 193.0 lb

## 2022-02-16 DIAGNOSIS — I4821 Permanent atrial fibrillation: Secondary | ICD-10-CM

## 2022-02-16 DIAGNOSIS — R079 Chest pain, unspecified: Secondary | ICD-10-CM | POA: Diagnosis not present

## 2022-02-16 DIAGNOSIS — I2510 Atherosclerotic heart disease of native coronary artery without angina pectoris: Secondary | ICD-10-CM | POA: Diagnosis not present

## 2022-02-16 DIAGNOSIS — I4891 Unspecified atrial fibrillation: Secondary | ICD-10-CM | POA: Diagnosis not present

## 2022-02-16 NOTE — Progress Notes (Signed)
CARDIOLOGY CONSULT NOTE       Patient ID: Paul Dunn MRN: [id number] DOB/AGE: 71-Feb-1952 71 y.o.  Admit date: (Not on file) Referring Physician: Hasanaj Primary Physician: Neale Burly, MD Primary Cardiologist: New Reason for Consultation: CAD  Active Problems:   * No active hospital problems. *   HPI:  71 y.o. referred by Dr Sherrie Sport for CAD. 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 with endoscopic surveillance   He quit smoking 30 years ago with some residual asthmatic bronchitis   Last seen by EP Dr [name] 06/18/21 note chronic afib with TTE showing severe LAE On coumadin for CHADVASC 4 and normal PPM function St Jude device not pacer dependant DOAC expensive for him and has preferred to be on coumadin INR ;s Rx except when held for EGD;s   Has not had any stress testing recently  Cath 06/23/16 by Dr End with diffuse 60% distal LAD dx, and 70% rPL stenosis No chest pain and small vessels so medical Rx recommended   TTE 07/01/20 EF 50-55% severe LAE and mild MR  He has left sided sciatica and does not walk well He does go to gym 2x/week and uses bicycle and weights with no chest pain   ROS All other systems reviewed and negative except as noted above  Past Medical History:  Diagnosis Date   Asthma    "when I was a child"   Atrial fibrillation ([place])    long standing persistent   Bulging of intervertebral disc between L4 and L5    GERD (gastroesophageal reflux disease)    Gouty arthritis    H/O hiatal hernia    High cholesterol    History of blood transfusion ~ 1984   History of bronchitis    "used to get it twice/year;  last time was ~ 2 yr ago" (04/12/2012)   HTN (hypertension)    Kidney cysts    "never treated it"   Macular degeneration    Peripheral vascular disease ([place])    Pulmonary nodule    Skin cancer    Tachycardia-bradycardia syndrome ([place])    s/p PPM implant by Dr [name] 04/12/12   Type 2 diabetes mellitus ([place])     Family History  Problem Relation Age of Onset   Coronary artery disease Mother    Coronary artery disease Father    Ulcers Father    Lung cancer Father    Diabetes Sister    Hypertension Sister    Rheum arthritis Sister    Hypertension Brother    Coronary artery disease Maternal Grandmother    Stroke Maternal Grandfather    Coronary artery disease Paternal Grandmother    Coronary artery disease Paternal Grandfather     Social History   Socioeconomic History   Marital status: Married    Spouse name: Not on file   Number of children: Not on file   Years of education: Not on file   Highest education level: Not on file  Occupational History   Not on file  Tobacco Use   Smoking status: Former    Packs/day: 1.00    Years: 2.00    Pack  years: 2.00    Types: Cigarettes    Start date: 09/12/1989    Quit date: 09/13/1991    Years since quitting: 30.4   Smokeless tobacco: Former    Types: Chew    Quit date: 09/12/1996  Vaping Use   Vaping Use: Not on file  Substance and Sexual Activity   Alcohol use: No    Alcohol/week: 0.0 standard drinks   Drug use: No   Sexual activity: Yes  Other Topics Concern   Not on file  Social History Narrative   Lives with wife in [place].  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[employer]   Social Determinants of Health   Financial Resource Strain: Not on file  Food Insecurity: Not on file  Transportation Needs: Not on file  Physical Activity: Not on file  Stress: Not on file  Social Connections: Not on file  Intimate Partner Violence: Not on file    Past Surgical History:  Procedure Laterality Date   BIOPSY  01/18/2022    Procedure: BIOPSY;  Surgeon: Harvel Quale, MD;  [facility];  Service: Gastroenterology;;   CARDIAC CATHETERIZATION N/A 06/23/2016   Procedure: Left Heart Cath and Coronary Angiography;  Surgeon: Nelva Bush, MD;  Location: [facility];  Service: Cardiovascular;  Laterality: N/A;   CATARACT EXTRACTION, BILATERAL     COLONOSCOPY N/A 05/22/2019   Procedure: COLONOSCOPY;  Surgeon: Rogene Houston, MD;  [facility];  Service: Endoscopy;  Laterality: N/A;  930   ESOPHAGEAL DILATION  01/2012   ESOPHAGOGASTRODUODENOSCOPY (EGD) WITH PROPOFOL N/A 01/01/2021   Procedure: ESOPHAGOGASTRODUODENOSCOPY (EGD) WITH PROPOFOL;  Surgeon: Harvel Quale, MD;  [facility];  Service: Gastroenterology;  Laterality: N/A;  AM   ESOPHAGOGASTRODUODENOSCOPY (EGD) WITH PROPOFOL N/A 01/18/2022   Procedure: ESOPHAGOGASTRODUODENOSCOPY (EGD) WITH PROPOFOL;  Surgeon: Harvel Quale, MD;  [facility];  Service: Gastroenterology;  Laterality: N/A;  [place]  01/01/2021   Procedure: HEMOSTASIS CLIP PLACEMENT;  Surgeon: Montez Morita, Quillian Quince, MD;  [facility];  Service: Gastroenterology;;   LACERATION REPAIR  ~ 1984   left forearm; "cut it w/a power saw"   MELANOMA EXCISION  ~ 2011   left posterior shoulder   PACEMAKER INSERTION  04/12/2012   SJM Accent DR RF pacemaker implanted by Dr [name] for tachy/brady syndrome   PERMANENT PACEMAKER INSERTION N/A 04/12/2012   Procedure: PERMANENT PACEMAKER INSERTION;  Surgeon: Thompson Grayer, MD;  Location: [facility];  Service: Cardiovascular;  Laterality: N/A;   POLYPECTOMY  05/22/2019   Procedure: POLYPECTOMY;  Surgeon: Rogene Houston, MD;  [facility];  Service: Endoscopy;;  transverse, cecum,ascending,    POLYPECTOMY  01/01/2021   Procedure: POLYPECTOMY;  Surgeon: Montez Morita, Quillian Quince, MD;  [facility];  Service: Gastroenterology;;   duodenal gastric      Current Outpatient Medications:    allopurinol (ZYLOPRIM) 300 MG tablet, Take 300 mg by mouth in the morning., Disp: , Rfl:    Calcium Carb-Cholecalciferol 600-500 MG-UNIT CAPS, Take 1 tablet by mouth in the morning., Disp: , Rfl:    Carboxymeth-Glyc-Polysorb PF (REFRESH OPTIVE MEGA-3) 0.5-1-0.5 % SOLN, Place 1 drop into both eyes in the morning., Disp: , Rfl:    Cholecalciferol (VITAMIN D) 125 MCG (5000 UT) CAPS, Take 5,000 Units by mouth every evening., Disp: , Rfl:    clonazePAM (KLONOPIN) 1 MG tablet, Take 1 mg by mouth at bedtime., Disp: , Rfl:  diltiazem (CARDIZEM CD) 180 MG 24 hr capsule, Take 360 mg by mouth in the morning., Disp: , Rfl:    famotidine (PEPCID) 40 MG tablet, Take 1 tablet (40 mg total) by mouth at bedtime., Disp: 90 tablet, Rfl: 3   glipiZIDE (GLUCOTROL XL) 2.5 MG 24 hr tablet, Take 2.5 mg by mouth in the morning., Disp: , Rfl:    metFORMIN (GLUCOPHAGE) 1000 MG tablet, Take 1,000 mg by mouth in the morning and at bedtime., Disp: , Rfl:    Multiple Vitamins-Minerals (PRESERVISION AREDS 2) CAPS, Take 1 capsule by mouth in the morning and at bedtime., Disp: , Rfl:    olmesartan-hydrochlorothiazide (BENICAR HCT) 20-12.5 MG tablet, Take 1 tablet by mouth in the morning., Disp: , Rfl:    Omega-3 Fatty Acids (FISH OIL) 1000 MG CAPS, Take 1,000 mg by mouth in the morning and at bedtime. Take 1000 mg in the morning and 2000 mg at bedtime, Disp: , Rfl:    omeprazole (PRILOSEC) 40 MG capsule, Take one po daily., Disp: 90 capsule, Rfl: 3   rosuvastatin (CRESTOR) 5 MG tablet, TAKE 1 TABLET EVERY MONDAY, WEDNESDAY, &FRIDAY (Patient taking differently: Take 5 mg by mouth every Monday, Wednesday, and Friday at 8 PM.), Disp: 38 tablet, Rfl: 2   triamcinolone cream (KENALOG) 0.1 %, Apply 1 application. topically 2 (two) times daily as needed (dry/irritated skin)., Disp: , Rfl:    warfarin (COUMADIN) 5 MG tablet, Take 7.5 mg by mouth daily. Take 1 tablet (5 mg) by  mouth on Sundays, Mondays, Tuesdays, Thursdays & Saturdays in the evening. Take 1.5 tablets (7.5 mg) by mouth on Wednesdays & Fridays in the evening., Disp: , Rfl:    Zinc 50 MG TABS, Take 50 mg by mouth in the morning., Disp: , Rfl:    enoxaparin (LOVENOX) 100 MG/ML injection, Inject into the skin., Disp: , Rfl:     Physical Exam: Blood pressure 134/72, pulse 64, height '5\' 11"'$  (1.803 m), weight 193 lb (87.5 kg), SpO2 99 %.    Affect appropriate Healthy:  appears stated age 34: normal Neck supple with no adenopathy JVP normal no bruits no thyromegaly Lungs clear with no wheezing and good diaphragmatic motion Heart:  S1/S2 no murmur, no rub, gallop or click PMI normal  PPM under left clavicle  Abdomen: benighn, BS positve, no tenderness, no AAA no bruit.  No HSM or HJR Distal pulses intact with no bruits No edema Neuro non-focal Skin warm and dry No muscular weakness   Labs:  No results found for: WBC, HGB, HCT, MCV, PLT No results for input(s): NA, K, CL, CO2, BUN, CREATININE, CALCIUM, PROT, BILITOT, ALKPHOS, ALT, AST, GLUCOSE in the last 168 hours.  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 Follow-up study. * Tracking Code: BO * EXAM: CT ABDOMEN AND PELVIS WITH CONTRAST TECHNIQUE: Multidetector CT imaging of the abdomen and pelvis was performed using the standard protocol following bolus administration of intravenous contrast. RADIATION DOSE REDUCTION: This exam was performed according to the departmental dose-optimization program which includes automated exposure  control, adjustment of the mA and/or kV according to patient size and/or use  of iterative reconstruction technique. CONTRAST:  1108m OMNIPAQUE IOHEXOL 300 MG/ML  SOLN COMPARISON:  None available. FINDINGS: Lower chest: Pacemaker lead terminating in the right ventricular apex. Atherosclerotic calcifications in the descending thoracic aorta and right coronary artery. Hepatobiliary: Diffuse low attenuation throughout the liver, indicative of a background of hepatic steatosis. Liver has a slightly shrunken appearance and nodular contour, indicating underlying cirrhosis. No discrete cystic or solid hepatic lesions. No intra or extrahepatic biliary ductal dilatation. Gallbladder is normal in appearance. Pancreas: No pancreatic mass. No pancreatic ductal dilatation. No pancreatic or peripancreatic fluid collections or inflammatory changes. Spleen: Unremarkable. Adrenals/Urinary Tract: Multiple well-defined low-attenuation lesions bilaterally, indicative of simple renal cysts (Bosniak class 1), requiring no imaging follow-up. No suspicious renal lesions. No hydroureteronephrosis. Bilateral adrenal glands are normal in appearance. Urinary bladder is normal in appearance. Stomach/Bowel: The appearance of the stomach is normal. There is no pathologic dilatation of small bowel or colon. No definite mass confidently identified in the small bowel or colon on today's examination. Normal appendix. Vascular/Lymphatic: Aortic atherosclerosis, without evidence of aneurysm or dissection in the abdominal or pelvic vasculature. No lymphadenopathy noted in the abdomen or pelvis. Reproductive: Prostate gland and seminal vesicles are unremarkable in appearance. Other: No significant volume of ascites.  No pneumoperitoneum. Musculoskeletal: Mixed lucent and sclerotic lesion in the right femoral neck, similar to prior studies, presumably benign (previously characterized on CT the right hip 07/05/2016). There are no aggressive appearing lytic  or blastic lesions noted in the visualized portions of the skeleton. IMPRESSION: 1. No primary malignancy or definite signs of metastatic disease noted in the abdomen or pelvis on today's examination. 2. The appearance of the liver suggests a combination of cirrhosis and hepatic steatosis, as above. 3. Aortic atherosclerosis, in addition to at least right coronary artery disease. Please note that although the presence of coronary artery calcium documents the presence of coronary artery disease, the severity of this disease and any potential stenosis cannot be assessed on this non-gated CT examination. Assessment for potential risk factor modification, dietary therapy or pharmacologic therapy may be warranted, if clinically indicated. 4. Additional incidental findings, as above. Electronically Signed   By: DVinnie LangtonM.D.   On: 02/11/2022 06:25   NM PET (CU-64 DETECTNET)SKULL TO MID THIGH  Result Date: 02/09/2022 CLINICAL DATA:  Duodenal carcinoid.  Recent endoscopy. EXAM: NUCLEAR MEDICINE PET SKULL BASE TO THIGH TECHNIQUE: 4.2 mCi copper 629DOTATATE was injected intravenously. Full-ring PET imaging was performed from the skull base to thigh after the radiotracer. CT data was obtained and used for attenuation correction and anatomic localization. COMPARISON:  None Available. FINDINGS: NECK No radiotracer activity in neck lymph nodes. Incidental CT findings: None CHEST No radiotracer accumulation within mediastinal or hilar lymph nodes. No suspicious pulmonary nodules on the CT scan. Incidental CT finding:LEFT-sided pacemaker. Coronary artery calcification and aortic atherosclerotic calcification. ABDOMEN/PELVIS Mild physiologic activity noted in the stomach, duodenum and small bowel. No focal lesion identified. Focal activity in the uncinate of the pancreas is a known benign physiologic finding. No abnormal radiotracer activity in the liver. No mesenteric implants.  No peritoneal radiotracer avid  nodularity. Physiologic activity noted in the liver, spleen, adrenal glands and kidneys. Incidental CT findings:None SKELETON No focal activity to suggest skeletal metastasis. Incidental CT findings:None IMPRESSION: 1. No evidence neuroendocrine tumor in the GI tract. 2. No evidence of metastatic adenopathy, peritoneal metastasis, mesenteric metastasis or liver metastasis. 3. No distant metastatic disease. Electronically Signed   By: SHelane GuntherD.  On: 02/09/2022 11:30    EKG: Afib no acute changes    ASSESSMENT AND PLAN:   CAD:  seen in RCA on recent abdominal CT Known small vessel diffuse dx in distal LAD and RCA by cath in 2017 No clinical angina but concern for progressive CAD will order f/u lexiscan myovue  GI:  GERD with pepcid use f/u surveillance for neuroendocrine tumor  Afib:  chronic rate control with cardizem and anticoagulation with coumadin  HLD:  continue crestor target LDL < 70   DM:  Discussed low carb diet.  Target hemoglobin A1c is 6.5 or less.  Continue current medications.  Lexiscan myovue   F/U with EP for PPM F/U with me in a year pending results   Signed: Jenkins Rouge 02/16/2022, 3:25 PM

## 2022-02-16 NOTE — Telephone Encounter (Signed)
Patient is calling back stating his gastrologist advised his CT showed coronary artery disease and he needs to speak with our office regarding this. Please advise.

## 2022-02-16 NOTE — Telephone Encounter (Signed)
Advised that he needed to discuss CT result with a primary cardiologist since Allred was EP doctor. Scheduled to see Nishan today at 3:00 pm at the [place] office.  Verbalized understanding.

## 2022-02-16 NOTE — Patient Instructions (Signed)
Medication Instructions:  Your physician recommends that you continue on your current medications as directed. Please refer to the Current Medication list given to you today.  *If you need a refill on your cardiac medications before your next appointment, please call your pharmacy*   Lab Work: NONE   If you have labs (blood work) drawn today and your tests are completely normal, you will receive your results only by: [place] (if you have MyChart) OR A paper copy in the mail If you have any lab test that is abnormal or we need to change your treatment, we will call you to review the results.   Testing/Procedures: Your physician has requested that you have a lexiscan myoview. For further information please visit [email or web address]. Please follow instruction sheet, as given.    Follow-Up: At [facility], you and your health needs are our priority.  As part of our continuing mission to provide you with exceptional heart care, we have created designated Provider Care Teams.  These Care Teams include your primary Cardiologist (physician) and Advanced Practice Providers (APPs -  Physician Assistants and Nurse Practitioners) who all work together to provide you with the care you need, when you need it.  We recommend signing up for the patient portal called "MyChart".  Sign up information is provided on this After Visit Summary.  MyChart is used to connect with patients for Virtual Visits (Telemedicine).  Patients are able to view lab/test results, encounter notes, upcoming appointments, etc.  Non-urgent messages can be sent to your provider as well.   To learn more about what you can do with MyChart, go to [email or web address].    Your next appointment:   1 year(s)  The format for your next appointment:   In Person  Provider:   Jenkins Rouge, MD    Other Instructions Thank you for choosing [facility]!    Important Information About Sugar

## 2022-02-17 DIAGNOSIS — I4891 Unspecified atrial fibrillation: Secondary | ICD-10-CM | POA: Diagnosis not present

## 2022-02-22 ENCOUNTER — Ambulatory Visit (HOSPITAL_COMMUNITY)
Admission: RE | Admit: 2022-02-22 | Discharge: 2022-02-22 | Disposition: A | Discharge status: Home / Self Care | Payer: Medicare Other | Source: Ambulatory Visit | Attending: Cardiovascular Disease | Admitting: Cardiovascular Disease

## 2022-02-22 ENCOUNTER — Encounter (HOSPITAL_COMMUNITY)
Admission: RE | Admit: 2022-02-22 | Discharge: 2022-02-22 | Disposition: A | Discharge status: Home / Self Care | Payer: Medicare Other | Source: Ambulatory Visit | Attending: Cardiovascular Disease | Admitting: Cardiovascular Disease

## 2022-02-22 ENCOUNTER — Encounter (HOSPITAL_COMMUNITY): Payer: Self-pay

## 2022-02-22 DIAGNOSIS — R079 Chest pain, unspecified: Secondary | ICD-10-CM | POA: Diagnosis not present

## 2022-02-22 LAB — NM MYOCAR MULTI W/SPECT W/WALL MOTION / EF
LV dias vol: 144 mL (ref 62–150)
LV sys vol: 78 mL
Nuc Stress EF: 45 %
Peak HR: 69 {beats}/min
RATE: 0.3
Rest HR: 61 {beats}/min
Rest Nuclear Isotope Dose: 11 mCi
SDS: 3
SRS: 20
SSS: 21
ST Depression (mm): 0 mm
Stress Nuclear Isotope Dose: 33 mCi
TID: 1.08

## 2022-02-22 MED ORDER — TECHNETIUM TC 99M TETROFOSMIN IV KIT
10.0000 | PACK | Freq: Once | INTRAVENOUS | Status: AC | PRN
  Administered 2022-02-22: 11 via INTRAVENOUS

## 2022-02-22 MED ORDER — SODIUM CHLORIDE FLUSH 0.9 % IV SOLN
INTRAVENOUS | Status: AC
  Administered 2022-02-22: 10 mL via INTRAVENOUS
  Filled 2022-02-22: qty 10

## 2022-02-22 MED ORDER — REGADENOSON 0.4 MG/5ML IV SOLN
INTRAVENOUS | Status: AC
  Administered 2022-02-22: 0.4 mg via INTRAVENOUS
  Filled 2022-02-22: qty 5

## 2022-02-22 MED ORDER — TECHNETIUM TC 99M TETROFOSMIN IV KIT
30.0000 | PACK | Freq: Once | INTRAVENOUS | Status: AC | PRN
  Administered 2022-02-22: 33 via INTRAVENOUS

## 2022-02-28 ENCOUNTER — Ambulatory Visit (HOSPITAL_COMMUNITY)
Admission: RE | Admit: 2022-02-28 | Discharge: 2022-02-28 | Disposition: A | Discharge status: Home / Self Care | Payer: Medicare Other | Source: Ambulatory Visit | Attending: Gastroenterology | Admitting: Gastroenterology

## 2022-02-28 ENCOUNTER — Telehealth: Payer: Self-pay | Admitting: Internal Medicine

## 2022-02-28 DIAGNOSIS — K7689 Other specified diseases of liver: Secondary | ICD-10-CM | POA: Diagnosis not present

## 2022-02-28 DIAGNOSIS — R932 Abnormal findings on diagnostic imaging of liver and biliary tract: Secondary | ICD-10-CM | POA: Insufficient documentation

## 2022-02-28 DIAGNOSIS — K760 Fatty (change of) liver, not elsewhere classified: Secondary | ICD-10-CM | POA: Diagnosis not present

## 2022-02-28 NOTE — H&P (View-Only) (Signed)
Cardiology Office Note    Date:  03/01/2022   ID:  Paul Dunn, DOB Feb 17, 1951, MRN [id number]  PCP:  Neale Burly, MD  Cardiologist: Jenkins Rouge, MD   EP: Dr. [name]  Chief Complaint  Patient presents with   Follow-up    Abnormal NST    History of Present Illness:    Paul Dunn is a 71 y.o. male past medical history of persistent atrial fibrillation, symptomatic bradycardia (s/p St. Jude PPM placement), HTN, HLD, neuroendocrine tumor and CAD (s/p cath in 06/2016 showing moderate 60% LAD stenosis and 70% rPL stenosis with medical management recommended) who presents to the office today for follow-up of his recent abnormal NST.   He was last examined by Dr. Johnsie Cancel in 02/2022 and recent Abdominal CT showed coronary calcification and while he did not report any anginal symptoms, Dr. Johnsie Cancel recommended a Lexiscan Myoview given concerns for progressive CAD. NST showed evidence of infarction in the apical to basal inferior area and EF was reduced at 45%. Therefore, Dr. Johnsie Cancel recommended a catheterization for definitive evaluation.   In talking with the patient today, he reports he goes to a local gym several days a week and denies any recent chest pain or dyspnea on exertion with this. Says he has maybe noticed more dyspnea over the past few years but thought this was secondary to age. No recent change in his energy level. No recent orthopnea, PND or pitting edema.   Past Medical History:  Diagnosis Date   Asthma    "when I was a child"   Atrial fibrillation ([place])    long standing persistent   Bulging of intervertebral disc between L4 and L5    GERD (gastroesophageal reflux disease)    Gouty arthritis    H/O hiatal hernia    High cholesterol    History of blood transfusion ~ 1984   History of bronchitis    "used to get it twice/year; last time was ~ 2 yr ago" (04/12/2012)   HTN (hypertension)    Kidney cysts    "never treated it"   Macular degeneration     Peripheral vascular disease ([place])    Pulmonary nodule    Skin cancer    Tachycardia-bradycardia syndrome ([place])    s/p PPM implant by Dr [name] 04/12/12   Type 2 diabetes mellitus ([place])     Past Surgical History:  Procedure Laterality Date   BIOPSY  01/18/2022   Procedure: BIOPSY;  Surgeon: Harvel Quale, MD;  [facility];  Service: Gastroenterology;;   CARDIAC CATHETERIZATION N/A 06/23/2016   Procedure: Left Heart Cath and Coronary Angiography;  Surgeon: Nelva Bush, MD;  Location: [facility] [facility];  Service: Cardiovascular;  Laterality: N/A;   CATARACT EXTRACTION, BILATERAL     COLONOSCOPY N/A 05/22/2019   Procedure: COLONOSCOPY;  Surgeon: Rogene Houston, MD;  [facility];  Service: Endoscopy;  Laterality: N/A;  930   ESOPHAGEAL DILATION  01/2012   ESOPHAGOGASTRODUODENOSCOPY (EGD) WITH PROPOFOL N/A 01/01/2021   Procedure: ESOPHAGOGASTRODUODENOSCOPY (EGD) WITH PROPOFOL;  Surgeon: Harvel Quale, MD;  [facility];  Service: Gastroenterology;  Laterality: N/A;  AM   ESOPHAGOGASTRODUODENOSCOPY (EGD) WITH PROPOFOL N/A 01/18/2022   Procedure: ESOPHAGOGASTRODUODENOSCOPY (EGD) WITH PROPOFOL;  Surgeon: Harvel Quale, MD;  [facility];  Service: Gastroenterology;  Laterality: N/A;  [place]  01/01/2021   Procedure: HEMOSTASIS CLIP PLACEMENT;  Surgeon: Harvel Quale, MD;  [facility];  Service: Gastroenterology;;   LACERATION REPAIR  ~ 1984   left forearm; "cut it w/a power saw"   MELANOMA EXCISION  ~ 2011   left posterior shoulder   PACEMAKER INSERTION  04/12/2012   SJM Accent DR RF pacemaker implanted by Dr [name] for tachy/brady syndrome   PERMANENT PACEMAKER INSERTION N/A 04/12/2012   Procedure: PERMANENT PACEMAKER INSERTION;  Surgeon: Thompson Grayer, MD;  Location: [facility];  Service: Cardiovascular;  Laterality: N/A;   POLYPECTOMY  05/22/2019   Procedure: POLYPECTOMY;   Surgeon: Rogene Houston, MD;  [facility];  Service: Endoscopy;;  transverse, cecum,ascending,    POLYPECTOMY  01/01/2021   Procedure: POLYPECTOMY;  Surgeon: Harvel Quale, MD;  [facility];  Service: Gastroenterology;;  duodenal gastric    Current Medications: Outpatient Medications Prior to Visit  Medication Sig Dispense Refill   allopurinol (ZYLOPRIM) 300 MG tablet Take 300 mg by mouth in the morning.     Calcium Carb-Cholecalciferol 600-500 MG-UNIT CAPS Take 1 tablet by mouth in the morning.     Carboxymeth-Glyc-Polysorb PF (REFRESH OPTIVE MEGA-3) 0.5-1-0.5 % SOLN Place 1 drop into both eyes in the morning.     Cholecalciferol (VITAMIN D) 125 MCG (5000 UT) CAPS Take 5,000 Units by mouth every evening.     clonazePAM (KLONOPIN) 1 MG tablet Take 1 mg by mouth at bedtime.     Cyanocobalamin (VITAMIN B 12 PO) Take by mouth. Once daily     diltiazem (CARDIZEM CD) 180 MG 24 hr capsule Take 360 mg by mouth in the morning.     famotidine (PEPCID) 40 MG tablet Take 1 tablet (40 mg total) by mouth at bedtime. 90 tablet 3   glipiZIDE (GLUCOTROL XL) 2.5 MG 24 hr tablet Take 2.5 mg by mouth in the morning.     metFORMIN (GLUCOPHAGE) 1000 MG tablet Take 1,000 mg by mouth in the morning and at bedtime.     Multiple Vitamins-Minerals (PRESERVISION AREDS 2) CAPS Take 1 capsule by mouth in the morning and at bedtime.     olmesartan-hydrochlorothiazide (BENICAR HCT) 20-12.5 MG tablet Take 1 tablet by mouth in the morning.     Omega-3 Fatty Acids (FISH OIL) 1000 MG CAPS Take 1,000 mg by mouth in the morning and at bedtime. Take 1000 mg in the morning and 2000 mg at bedtime     omeprazole (PRILOSEC) 40 MG capsule Take one po daily. 90 capsule 3   rosuvastatin (CRESTOR) 5 MG tablet TAKE 1 TABLET EVERY MONDAY, WEDNESDAY, &FRIDAY (Patient taking differently: Take 5 mg by mouth every Monday, Wednesday, and Friday at 8 PM.) 38 tablet 2   triamcinolone cream (KENALOG) 0.1 %  Apply 1 application. topically 2 (two) times daily as needed (dry/irritated skin).     warfarin (COUMADIN) 5 MG tablet Take 7.5 mg by mouth daily. Take 1 tablet (5 mg) by mouth on Sundays, Mondays, Tuesdays, Thursdays & Saturdays in the evening. Take 1.5 tablets (7.5 mg) by mouth on Wednesdays & Fridays in the evening.     Zinc 50 MG TABS Take 50 mg by mouth in the morning.     enoxaparin (LOVENOX) 100 MG/ML injection Inject into the skin.     No facility-administered medications prior to visit.     Allergies:   Shellfish allergy and Sudafed [pseudoephedrine]   Social History   Socioeconomic History   Marital status: Married    Spouse name: Not on file   Number of children: Not  on file   Years of education: Not on file   Highest education level: Not on file  Occupational History   Not on file  Tobacco Use   Smoking status: Former    Packs/day: 1.00    Years: 2.00    Total pack years: 2.00    Types: Cigarettes    Start date: 09/12/1989    Quit date: 09/13/1991    Years since quitting: 30.4   Smokeless tobacco: Former    Types: Chew    Quit date: 09/12/1996  Vaping Use   Vaping Use: Not on file  Substance and Sexual Activity   Alcohol use: No    Alcohol/week: 0.0 standard drinks of alcohol   Drug use: No   Sexual activity: Yes  Other Topics Concern   Not on file  Social History Narrative   Lives with wife in [place].  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[employer]   Social Determinants of Health   Financial Resource Strain: Not on file  Food Insecurity: Not on file  Transportation Needs: Not on file  Physical Activity: Not on file  Stress: Not on file  Social Connections: Not on file     Family History:  The patient's family history includes Coronary artery disease in his father, maternal grandmother, mother, paternal grandfather, and paternal grandmother; Diabetes in his sister; Hypertension in his brother and sister; Lung cancer in his father; Rheum arthritis  in his sister; Stroke in his maternal grandfather; Ulcers in his father.   Review of Systems:    Please see the history of present illness.     All other systems reviewed and are otherwise negative except as noted above.   Physical Exam:    VS:  BP 122/64 (BP Location: Left Arm, Patient Position: Sitting, Cuff Size: Large)   Pulse 60   Ht '5\' 11"'$  (1.803 m)   Wt 194 lb (88 kg)   SpO2 97%   BMI 27.06 kg/m    General: Well developed, well nourished,male appearing in no acute distress. Head: Normocephalic, atraumatic. Neck: No carotid bruits. JVD not elevated.  Lungs: Respirations regular and unlabored, without wheezes or rales.  Heart: Irregularly irregular. No S3 or S4.  No murmur, no rubs, or gallops appreciated. Abdomen: Appears non-distended. No obvious abdominal masses. Msk:  Strength and tone appear normal for age. No obvious joint deformities or effusions. Extremities: No clubbing or cyanosis. No pitting edema.  Distal pedal pulses are 2+ bilaterally. Neuro: Alert and oriented X 3. Moves all extremities spontaneously. No focal deficits noted. Psych:  Responds to questions appropriately with a normal affect. Skin: No rashes or lesions noted  Wt Readings from Last 3 Encounters:  03/01/22 194 lb (88 kg)  02/16/22 193 lb (87.5 kg)  01/18/22 190 lb (86.2 kg)     Studies/Labs Reviewed:   EKG:  EKG is not ordered today. EKG from recent NST on 02/22/2022 reviewed which shows V-paced rhythm, HR 60 with underlying atrial fibrillation.   Recent Labs: 03/01/2022: BUN 22; Creatinine, Ser 1.16; Hemoglobin 14.2; Platelets 178; Potassium 3.9; Sodium 137   Lipid Panel No results found for: "CHOL", "TRIG", "HDL", "CHOLHDL", "VLDL", "Delight", "LDLDIRECT"  Additional studies/ records that were reviewed today include:   LHC: 06/2016 Conclusions: Moderate, non-critical coronary artery disease, including diffuse LAD disease (up to 60% in the distal vessel) and 70% rPL stenoses.  rPL  lesions are in small vessel; would favor medical therapy over PCI unless patient develops refractory angina. Normal left ventricular  filling pressure. Mildly reduced LV contraction with dyssynchrony, which may be related to RV pacing, as LVEF was normal by echo last month.   Recommendations: Given lack of chest pain for several months, will pursue aggressive medical therapy.  Start isosorbide mononitrate 15 mg daily and start rosuvastatin 5 mg M,W,F (as patient has experienced myalgias with statins in the past). Suspect dyssynchrony from RV pacing is contributing to appearance of reduced LVEF today.  Echo on 06/01/16 showed normal systolic function. Restart warfarin tonight and metformin in 48 hours. Follow-up with Dr. [name] as an outpatient.   NST: 02/2022 Large fixed defect in the inferior segments and inferoseptum into the apex. Hypokinesis in this segments. The findings either represent infarct vs diaphragm attenuation.   LV perfusion is abnormal. There is no evidence of ischemia. There is evidence of infarction. Defect 1: There is a large defect with severe reduction in uptake present in the apical to basal inferior, inferoseptal and apex location(s) that is fixed. There is abnormal wall motion in the defect area. Consistent with infarction.   Left ventricular function is abnormal. Nuclear stress EF: 45 %. The left ventricular ejection fraction is mildly decreased (45-54%). End diastolic cavity size is mildly enlarged.   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 The study is intermediate risk.   Assessment:    1. Coronary artery disease involving native coronary artery of native heart without angina pectoris   2. Abnormal stress test   3. Preop cardiovascular exam   4. Permanent atrial fibrillation ([place])   5. Tachycardia-bradycardia syndrome ([place])   6. Essential hypertension   7. Hyperlipidemia LDL goal <70      Plan:   In order of problems listed above:  1.  CAD/Abnormal NST - He has known CAD with prior cath in 06/2016 showing moderate 60% LAD stenosis and 70% rPL stenosis with medical management recommended. Recent NST showed evidence of infarction in the apical to basal inferior area and EF was reduced at 45% with Dr. Johnsie Cancel recommending a catheterization for definitive evaluation. The patient understands that risks include but are not limited to stroke (1 in 1000), death (1 in 27), kidney failure [usually temporary] (1 in 500), bleeding (1 in 200), allergic reaction [possibly serious] (1 in 200). He is in agreement to proceed. We reviewed NST results may be secondary to small vessel disease but this will be definitive. - Reviewed with Pharmacy and he can hold Coumadin for 5 days prior with no Lovenox bridge. Will take ASA '81mg'$  daily prior to his cath. Continue statin therapy. If EF reduced by cath as well, would plan to switch Cardizem CD to Toprol-XL and titrate GDMT. No recent signs/symptoms of CHF so EF could be falsely reduced by NST.    2. Permanent Atrial Fibrillation - He denies any recent palpitations and HR is well-controlled in the 60's today. Continue Cardizem CD '180mg'$  daily.  - No reports of active bleeding. He remains on Coumadin for anticoagulation.   3. Symptomatic bradycardia - He is s/p St. Jude PPM placement which is followed by Dr. [name]. Device interrogation in 12/2021 showed normal device function.   4. HTN - His BP is well-controlled at 122/64 during today's visit. Continue current medical therapy with Cardizem CD '180mg'$  daily and Olmesartan-HCTZ 20-12.'5mg'$  daily.   5. HLD - Followed by his PCP. Will request a copy of most recent labs. He remains on Crestor '5mg'$  MF as he has been intolerant to higher-intensity statin therapy in the past secondary to myalgias.  Medication Adjustments/Labs and Tests Ordered: Current medicines are reviewed at length with the patient today.  Concerns regarding medicines are outlined above.   Medication changes, Labs and Tests ordered today are listed in the Patient Instructions below. Patient Instructions  Medication Instructions:  Your physician recommends that you continue on your current medications as directed. Please refer to the Current Medication list given to you today.   Labwork: BMET CBC  Testing/Procedures: Left Heart Cath  Follow-Up: Follow up with Dr. Johnsie Cancel or Bernerd Pho, PA-C 3-4 weeks after Cath.   Any Other Special Instructions Will Be Listed Below (If Applicable).     If you need a refill on your cardiac medications before your next appointment, please call your pharmacy.    [address] [place] [address] Dept: [phone] Loc: [place]  03/01/2022  You are scheduled for a Cardiac Catheterization on Thursday, 03/10/2022 with Dr.McAlhaney .  1. Please arrive at the Main Entrance A at [facility]: [place], [place] [id number] at 5:30 am   (This time is two hours before your procedure to ensure your preparation). Free valet parking service is available.   Special note: Every effort is made to have your procedure done on time. Please understand that emergencies sometimes delay scheduled procedures.  2. Diet: Do not eat solid foods after midnight.  You may have clear liquids until 5 AM upon the day of the procedure.  3. Labs: You will need to have blood drawn on today. You do not need to be fasting.  4. Medication instructions in preparation for your procedure:   Contrast Allergy: No  Stop Warfarin on 03/05/2022- This is 5 days prior to your Cath.   Hold Olmesartan-Hydrochlorothiazide the morning of your procedure.     On the morning of your procedure, take Aspirin 81 mg tablet and any morning medicines NOT listed above.  You may use sips of water.  5. Plan to go home the same day, you will only stay  overnight if medically necessary. 6. You MUST have a responsible adult to drive you home. 7. An adult MUST be with you the first 24 hours after you arrive home. 8. Bring a current list of your medications, and the last time and date medication taken. 9. Bring ID and current insurance cards. 10.Please wear clothes that are easy to get on and off and wear slip-on shoes.  Thank you for allowing [place] to care for you!   -- [facility]    Signed, Erma Heritage, Hershal Coria  03/01/2022 7:50 PM    [place]. [address] [place], [place] [address] Phone: [phone] Fax: [phone]

## 2022-02-28 NOTE — Progress Notes (Unsigned)
Cardiology Office Note    Date:  03/01/2022   ID:  TOA MIA, DOB [birth year], MRN [id number]  PCP:  Neale Burly, MD  Cardiologist: Jenkins Rouge, MD   EP: Dr. [name]  Chief Complaint  Patient presents with   Follow-up    Abnormal NST    History of Present Illness:    Paul Dunn is a 71 y.o. male past medical history of persistent atrial fibrillation, symptomatic bradycardia (s/p St. Jude PPM placement), HTN, HLD, neuroendocrine tumor and CAD (s/p cath in 06/2016 showing moderate 60% LAD stenosis and 70% rPL stenosis with medical management recommended) who presents to the office today for follow-up of his recent abnormal NST.   He was last examined by Dr. Johnsie Cancel in 02/2022 and recent Abdominal CT showed coronary calcification and while he did not report any anginal symptoms, Dr. Johnsie Cancel recommended a Lexiscan Myoview given concerns for progressive CAD. NST showed evidence of infarction in the apical to basal inferior area and EF was reduced at 45%. Therefore, Dr. Johnsie Cancel recommended a catheterization for definitive evaluation.   In talking with the patient today, he reports he goes to a local gym several days a week and denies any recent chest pain or dyspnea on exertion with this. Says he has maybe noticed more dyspnea over the past few years but thought this was secondary to age. No recent change in his energy level. No recent orthopnea, PND or pitting edema.   Past Medical History:  Diagnosis Date   Asthma    "when I was a child"   Atrial fibrillation ([place])    long standing persistent   Bulging of intervertebral disc between L4 and L5    GERD (gastroesophageal reflux disease)    Gouty arthritis    H/O hiatal hernia    High cholesterol    History of blood transfusion ~ 1984   History of bronchitis    "used to get it twice/year; last time was ~ 2 yr ago" (04/12/2012)   HTN (hypertension)    Kidney cysts    "never treated it"   Macular degeneration     Peripheral vascular disease ([place])    Pulmonary nodule    Skin cancer    Tachycardia-bradycardia syndrome ([place])    s/p PPM implant by Dr [name] 04/12/12   Type 2 diabetes mellitus ([place])     Past Surgical History:  Procedure Laterality Date   BIOPSY  01/18/2022   Procedure: BIOPSY;  Surgeon: Harvel Quale, MD;  [facility];  Service: Gastroenterology;;   CARDIAC CATHETERIZATION N/A 06/23/2016   Procedure: Left Heart Cath and Coronary Angiography;  Surgeon: Nelva Bush, MD;  Location: [facility];  Service: Cardiovascular;  Laterality: N/A;   CATARACT EXTRACTION, BILATERAL     COLONOSCOPY N/A 05/22/2019   Procedure: COLONOSCOPY;  Surgeon: Rogene Houston, MD;  [facility];  Service: Endoscopy;  Laterality: N/A;  930   ESOPHAGEAL DILATION  01/2012   ESOPHAGOGASTRODUODENOSCOPY (EGD) WITH PROPOFOL N/A 01/01/2021   Procedure: ESOPHAGOGASTRODUODENOSCOPY (EGD) WITH PROPOFOL;  Surgeon: Harvel Quale, MD;  [facility];  Service: Gastroenterology;  Laterality: N/A;  AM   ESOPHAGOGASTRODUODENOSCOPY (EGD) WITH PROPOFOL N/A 01/18/2022   Procedure: ESOPHAGOGASTRODUODENOSCOPY (EGD) WITH PROPOFOL;  Surgeon: Harvel Quale, MD;  [facility];  Service: Gastroenterology;  Laterality: N/A;  [place]  01/01/2021   Procedure: HEMOSTASIS CLIP PLACEMENT;  Surgeon: Harvel Quale, MD;  [facility]  [facility];  Service: Gastroenterology;;   LACERATION REPAIR  ~ 1984   left forearm; "cut it w/a power saw"   MELANOMA EXCISION  ~ 2011   left posterior shoulder   PACEMAKER INSERTION  04/12/2012   SJM Accent DR RF pacemaker implanted by Dr [name] for tachy/brady syndrome   PERMANENT PACEMAKER INSERTION N/A 04/12/2012   Procedure: PERMANENT PACEMAKER INSERTION;  Surgeon: Thompson Grayer, MD;  Location: [facility] C[facility] LAB;  Service: Cardiovascular;  Laterality: N/A;   POLYPECTOMY  05/22/2019   Procedure: POLYPECTOMY;   Surgeon: Rogene Houston, MD;  [facility];  Service: Endoscopy;;  transverse, cecum,ascending,    POLYPECTOMY  01/01/2021   Procedure: POLYPECTOMY;  Surgeon: Harvel Quale, MD;  [facility];  Service: Gastroenterology;;  duodenal gastric    Current Medications: Outpatient Medications Prior to Visit  Medication Sig Dispense Refill   allopurinol (ZYLOPRIM) 300 MG tablet Take 300 mg by mouth in the morning.     Calcium Carb-Cholecalciferol 600-500 MG-UNIT CAPS Take 1 tablet by mouth in the morning.     Carboxymeth-Glyc-Polysorb PF (REFRESH OPTIVE MEGA-3) 0.5-1-0.5 % SOLN Place 1 drop into both eyes in the morning.     Cholecalciferol (VITAMIN D) 125 MCG (5000 UT) CAPS Take 5,000 Units by mouth every evening.     clonazePAM (KLONOPIN) 1 MG tablet Take 1 mg by mouth at bedtime.     Cyanocobalamin (VITAMIN B 12 PO) Take by mouth. Once daily     diltiazem (CARDIZEM CD) 180 MG 24 hr capsule Take 360 mg by mouth in the morning.     famotidine (PEPCID) 40 MG tablet Take 1 tablet (40 mg total) by mouth at bedtime. 90 tablet 3   glipiZIDE (GLUCOTROL XL) 2.5 MG 24 hr tablet Take 2.5 mg by mouth in the morning.     metFORMIN (GLUCOPHAGE) 1000 MG tablet Take 1,000 mg by mouth in the morning and at bedtime.     Multiple Vitamins-Minerals (PRESERVISION AREDS 2) CAPS Take 1 capsule by mouth in the morning and at bedtime.     olmesartan-hydrochlorothiazide (BENICAR HCT) 20-12.5 MG tablet Take 1 tablet by mouth in the morning.     Omega-3 Fatty Acids (FISH OIL) 1000 MG CAPS Take 1,000 mg by mouth in the morning and at bedtime. Take 1000 mg in the morning and 2000 mg at bedtime     omeprazole (PRILOSEC) 40 MG capsule Take one po daily. 90 capsule 3   rosuvastatin (CRESTOR) 5 MG tablet TAKE 1 TABLET EVERY MONDAY, WEDNESDAY, &FRIDAY (Patient taking differently: Take 5 mg by mouth every Monday, Wednesday, and Friday at 8 PM.) 38 tablet 2   triamcinolone cream (KENALOG) 0.1 %  Apply 1 application. topically 2 (two) times daily as needed (dry/irritated skin).     warfarin (COUMADIN) 5 MG tablet Take 7.5 mg by mouth daily. Take 1 tablet (5 mg) by mouth on Sundays, Mondays, Tuesdays, Thursdays & Saturdays in the evening. Take 1.5 tablets (7.5 mg) by mouth on Wednesdays & Fridays in the evening.     Zinc 50 MG TABS Take 50 mg by mouth in the morning.     enoxaparin (LOVENOX) 100 MG/ML injection Inject into the skin.     No facility-administered medications prior to visit.     Allergies:   Shellfish allergy and Sudafed [pseudoephedrine]   Social History   Socioeconomic History   Marital status: Married    Spouse name: Not on file   Number of children: Not  on file   Years of education: Not on file   Highest education level: Not on file  Occupational History   Not on file  Tobacco Use   Smoking status: Former    Packs/day: 1.00    Years: 2.00    Total pack years: 2.00    Types: Cigarettes    Start date: 09/12/1989    Quit date: 09/13/1991    Years since quitting: 30.4   Smokeless tobacco: Former    Types: Chew    Quit date: 09/12/1996  Vaping Use   Vaping Use: Not on file  Substance and Sexual Activity   Alcohol use: No    Alcohol/week: 0.0 standard drinks of alcohol   Drug use: No   Sexual activity: Yes  Other Topics Concern   Not on file  Social History Narrative   Lives with wife in [place].  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[employer]   Social Determinants of Health   Financial Resource Strain: Not on file  Food Insecurity: Not on file  Transportation Needs: Not on file  Physical Activity: Not on file  Stress: Not on file  Social Connections: Not on file     Family History:  The patient's family history includes Coronary artery disease in his father, maternal grandmother, mother, paternal grandfather, and paternal grandmother; Diabetes in his sister; Hypertension in his brother and sister; Lung cancer in his father; Rheum arthritis  in his sister; Stroke in his maternal grandfather; Ulcers in his father.   Review of Systems:    Please see the history of present illness.     All other systems reviewed and are otherwise negative except as noted above.   Physical Exam:    VS:  BP 122/64 (BP Location: Left Arm, Patient Position: Sitting, Cuff Size: Large)   Pulse 60   Ht '5\' 11"'$  (1.803 m)   Wt 194 lb (88 kg)   SpO2 97%   BMI 27.06 kg/m    General: Well developed, well nourished,male appearing in no acute distress. Head: Normocephalic, atraumatic. Neck: No carotid bruits. JVD not elevated.  Lungs: Respirations regular and unlabored, without wheezes or rales.  Heart: Irregularly irregular. No S3 or S4.  No murmur, no rubs, or gallops appreciated. Abdomen: Appears non-distended. No obvious abdominal masses. Msk:  Strength and tone appear normal for age. No obvious joint deformities or effusions. Extremities: No clubbing or cyanosis. No pitting edema.  Distal pedal pulses are 2+ bilaterally. Neuro: Alert and oriented X 3. Moves all extremities spontaneously. No focal deficits noted. Psych:  Responds to questions appropriately with a normal affect. Skin: No rashes or lesions noted  Wt Readings from Last 3 Encounters:  03/01/22 194 lb (88 kg)  02/16/22 193 lb (87.5 kg)  01/18/22 190 lb (86.2 kg)     Studies/Labs Reviewed:   EKG:  EKG is not ordered today. EKG from recent NST on 02/22/2022 reviewed which shows V-paced rhythm, HR 60 with underlying atrial fibrillation.   Recent Labs: 03/01/2022: BUN 22; Creatinine, Ser 1.16; Hemoglobin 14.2; Platelets 178; Potassium 3.9; Sodium 137   Lipid Panel No results found for: "CHOL", "TRIG", "HDL", "CHOLHDL", "VLDL", "[place]", "LDLDIRECT"  Additional studies/ records that were reviewed today include:   LHC: 06/2016 Conclusions: Moderate, non-critical coronary artery disease, including diffuse LAD disease (up to 60% in the distal vessel) and 70% rPL stenoses.  rPL  lesions are in small vessel; would favor medical therapy over PCI unless patient develops refractory angina. Normal left ventricular  filling pressure. Mildly reduced LV contraction with dyssynchrony, which may be related to RV pacing, as LVEF was normal by echo last month.   Recommendations: Given lack of chest pain for several months, will pursue aggressive medical therapy.  Start isosorbide mononitrate 15 mg daily and start rosuvastatin 5 mg M,W,F (as patient has experienced myalgias with statins in the past). Suspect dyssynchrony from RV pacing is contributing to appearance of reduced LVEF today.  Echo on 06/01/16 showed normal systolic function. Restart warfarin tonight and metformin in 48 hours. Follow-up with Dr. [name] as an outpatient.   NST: 02/2022 Large fixed defect in the inferior segments and inferoseptum into the apex. Hypokinesis in this segments. The findings either represent infarct vs diaphragm attenuation.   LV perfusion is abnormal. There is no evidence of ischemia. There is evidence of infarction. Defect 1: There is a large defect with severe reduction in uptake present in the apical to basal inferior, inferoseptal and apex location(s) that is fixed. There is abnormal wall motion in the defect area. Consistent with infarction.   Left ventricular function is abnormal. Nuclear stress EF: 45 %. The left ventricular ejection fraction is mildly decreased (45-54%). End diastolic cavity size is mildly enlarged.   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 The study is intermediate risk.   Assessment:    1. Coronary artery disease involving native coronary artery of native heart without angina pectoris   2. Abnormal stress test   3. Preop cardiovascular exam   4. Permanent atrial fibrillation ([place])   5. Tachycardia-bradycardia syndrome ([place])   6. Essential hypertension   7. Hyperlipidemia LDL goal <70      Plan:   In order of problems listed above:  1.  CAD/Abnormal NST - He has known CAD with prior cath in 06/2016 showing moderate 60% LAD stenosis and 70% rPL stenosis with medical management recommended. Recent NST showed evidence of infarction in the apical to basal inferior area and EF was reduced at 45% with Dr. Johnsie Cancel recommending a catheterization for definitive evaluation. The patient understands that risks include but are not limited to stroke (1 in 1000), death (1 in 52), kidney failure [usually temporary] (1 in 500), bleeding (1 in 200), allergic reaction [possibly serious] (1 in 200). He is in agreement to proceed. We reviewed NST results may be secondary to small vessel disease but this will be definitive. - Reviewed with Pharmacy and he can hold Coumadin for 5 days prior with no Lovenox bridge. Will take ASA '81mg'$  daily prior to his cath. Continue statin therapy. If EF reduced by cath as well, would plan to switch Cardizem CD to Toprol-XL and titrate GDMT. No recent signs/symptoms of CHF so EF could be falsely reduced by NST.    2. Permanent Atrial Fibrillation - He denies any recent palpitations and HR is well-controlled in the 60's today. Continue Cardizem CD '180mg'$  daily.  - No reports of active bleeding. He remains on Coumadin for anticoagulation.   3. Symptomatic bradycardia - He is s/p St. Jude PPM placement which is followed by Dr. [name]. Device interrogation in 12/2021 showed normal device function.   4. HTN - His BP is well-controlled at 122/64 during today's visit. Continue current medical therapy with Cardizem CD '180mg'$  daily and Olmesartan-HCTZ 20-12.'5mg'$  daily.   5. HLD - Followed by his PCP. Will request a copy of most recent labs. He remains on Crestor '5mg'$  MF as he has been intolerant to higher-intensity statin therapy in the past secondary to myalgias.  Medication Adjustments/Labs and Tests Ordered: Current medicines are reviewed at length with the patient today.  Concerns regarding medicines are outlined above.   Medication changes, Labs and Tests ordered today are listed in the Patient Instructions below. Patient Instructions  Medication Instructions:  Your physician recommends that you continue on your current medications as directed. Please refer to the Current Medication list given to you today.   Labwork: BMET CBC  Testing/Procedures: Left Heart Cath  Follow-Up: Follow up with Dr. Johnsie Cancel or Bernerd Pho, PA-C 3-4 weeks after Cath.   Any Other Special Instructions Will Be Listed Below (If Applicable).     If you need a refill on your cardiac medications before your next appointment, please call your pharmacy.    [address] [place] [address] Dept: [phone] Loc: [place]  03/01/2022  You are scheduled for a Cardiac Catheterization on Thursday, 03/10/2022 with Dr.McAlhaney .  1. Please arrive at the Main Entrance A at [facility]: [place], [place] [address] at 5:30 am   (This time is two hours before your procedure to ensure your preparation). Free valet parking service is available.   Special note: Every effort is made to have your procedure done on time. Please understand that emergencies sometimes delay scheduled procedures.  2. Diet: Do not eat solid foods after midnight.  You may have clear liquids until 5 AM upon the day of the procedure.  3. Labs: You will need to have blood drawn on today. You do not need to be fasting.  4. Medication instructions in preparation for your procedure:   Contrast Allergy: No  Stop Warfarin on 03/05/2022- This is 5 days prior to your Cath.   Hold Olmesartan-Hydrochlorothiazide the morning of your procedure.     On the morning of your procedure, take Aspirin 81 mg tablet and any morning medicines NOT listed above.  You may use sips of water.  5. Plan to go home the same day, you will only stay  overnight if medically necessary. 6. You MUST have a responsible adult to drive you home. 7. An adult MUST be with you the first 24 hours after you arrive home. 8. Bring a current list of your medications, and the last time and date medication taken. 9. Bring ID and current insurance cards. 10.Please wear clothes that are easy to get on and off and wear slip-on shoes.  Thank you for allowing [place] to care for you!   -- [facility]    Signed, Erma Heritage, Hershal Coria  03/01/2022 7:50 PM    [place]. [address] [place], [place] [address] Phone: [phone] Fax: [phone]

## 2022-02-28 NOTE — Telephone Encounter (Signed)
error 

## 2022-03-01 ENCOUNTER — Other Ambulatory Visit (HOSPITAL_COMMUNITY)
Admission: RE | Admit: 2022-03-01 | Discharge: 2022-03-01 | Disposition: A | Discharge status: Home / Self Care | Payer: Medicare Other | Source: Ambulatory Visit | Attending: Student | Admitting: Student

## 2022-03-01 ENCOUNTER — Ambulatory Visit (INDEPENDENT_AMBULATORY_CARE_PROVIDER_SITE_OTHER): Payer: Medicare Other | Admitting: Student

## 2022-03-01 ENCOUNTER — Encounter: Payer: Self-pay | Admitting: Student

## 2022-03-01 VITALS — BP 122/64 | HR 60 | Ht 71.0 in | Wt 194.0 lb

## 2022-03-01 DIAGNOSIS — I10 Essential (primary) hypertension: Secondary | ICD-10-CM

## 2022-03-01 DIAGNOSIS — R9439 Abnormal result of other cardiovascular function study: Secondary | ICD-10-CM

## 2022-03-01 DIAGNOSIS — E785 Hyperlipidemia, unspecified: Secondary | ICD-10-CM | POA: Diagnosis not present

## 2022-03-01 DIAGNOSIS — Z01810 Encounter for preprocedural cardiovascular examination: Secondary | ICD-10-CM | POA: Diagnosis not present

## 2022-03-01 DIAGNOSIS — I2510 Atherosclerotic heart disease of native coronary artery without angina pectoris: Secondary | ICD-10-CM

## 2022-03-01 DIAGNOSIS — I495 Sick sinus syndrome: Secondary | ICD-10-CM

## 2022-03-01 DIAGNOSIS — I4821 Permanent atrial fibrillation: Secondary | ICD-10-CM | POA: Diagnosis not present

## 2022-03-01 LAB — BASIC METABOLIC PANEL
Anion gap: 8 (ref 5–15)
BUN: 22 mg/dL (ref 8–23)
CO2: 25 mmol/L (ref 22–32)
Calcium: 8.9 mg/dL (ref 8.9–10.3)
Chloride: 104 mmol/L (ref 98–111)
Creatinine, Ser: 1.16 mg/dL (ref 0.61–1.24)
GFR, Estimated: >60 mL/min (ref >60)
Glucose, Bld: 110 mg/dL — ABNORMAL HIGH (ref 70–99)
Potassium: 3.9 mmol/L (ref 3.5–5.1)
Sodium: 137 mmol/L (ref 135–145)

## 2022-03-01 LAB — CBC
HCT: 41.9 % (ref 39.0–52.0)
Hemoglobin: 14.2 g/dL (ref 13.0–17.0)
MCH: 30.5 pg (ref 26.0–34.0)
MCHC: 33.9 g/dL (ref 30.0–36.0)
MCV: 90.1 fL (ref 80.0–100.0)
Platelets: 178 10*3/uL (ref 150–400)
RBC: 4.65 MIL/uL (ref 4.22–5.81)
RDW: 14.7 % (ref 11.5–15.5)
WBC: 7.9 10*3/uL (ref 4.0–10.5)
nRBC: 0 % (ref 0.0–0.2)

## 2022-03-01 NOTE — Patient Instructions (Addendum)
Medication Instructions:  Your physician recommends that you continue on your current medications as directed. Please refer to the Current Medication list given to you today.   Labwork: BMET CBC  Testing/Procedures: Left Heart Cath  Follow-Up: Follow up with Dr. Johnsie Cancel or Bernerd Pho, PA-C 3-4 weeks after Cath.   Any Other Special Instructions Will Be Listed Below (If Applicable).     If you need a refill on your cardiac medications before your next appointment, please call your pharmacy.    [place] [address] Dept: [phone] Loc: [place]  03/01/2022  You are scheduled for a Cardiac Catheterization on Thursday, 03/10/2022 with Dr.McAlhaney .  1. Please arrive at the Main Entrance A at [facility]: [place], [place] [address] at 5:30 am   (This time is two hours before your procedure to ensure your preparation). Free valet parking service is available.   Special note: Every effort is made to have your procedure done on time. Please understand that emergencies sometimes delay scheduled procedures.  2. Diet: Do not eat solid foods after midnight.  You may have clear liquids until 5 AM upon the day of the procedure.  3. Labs: You will need to have blood drawn on today. You do not need to be fasting.  4. Medication instructions in preparation for your procedure:   Contrast Allergy: No  Stop Warfarin on 03/05/2022- This is 5 days prior to your Cath.   Hold Olmesartan-Hydrochlorothiazide the morning of your procedure.     On the morning of your procedure, take Aspirin 81 mg tablet and any morning medicines NOT listed above.  You may use sips of water.  5. Plan to go home the same day, you will only stay overnight if medically necessary. 6. You MUST have a responsible adult to drive you home. 7. An adult MUST be with  you the first 24 hours after you arrive home. 8. Bring a current list of your medications, and the last time and date medication taken. 9. Bring ID and current insurance cards. 10.Please wear clothes that are easy to get on and off and wear slip-on shoes.  Thank you for allowing [place] to care for you!   -- [place] [facility]

## 2022-03-07 ENCOUNTER — Other Ambulatory Visit (INDEPENDENT_AMBULATORY_CARE_PROVIDER_SITE_OTHER): Payer: Self-pay

## 2022-03-07 ENCOUNTER — Encounter (INDEPENDENT_AMBULATORY_CARE_PROVIDER_SITE_OTHER): Payer: Self-pay

## 2022-03-07 DIAGNOSIS — R945 Abnormal results of liver function studies: Secondary | ICD-10-CM

## 2022-03-07 DIAGNOSIS — R932 Abnormal findings on diagnostic imaging of liver and biliary tract: Secondary | ICD-10-CM

## 2022-03-08 ENCOUNTER — Telehealth: Payer: Self-pay | Admitting: *Deleted

## 2022-03-10 ENCOUNTER — Encounter (HOSPITAL_COMMUNITY): Payer: Self-pay | Admitting: Cardiovascular Disease

## 2022-03-10 ENCOUNTER — Other Ambulatory Visit: Payer: Self-pay

## 2022-03-10 ENCOUNTER — Ambulatory Visit (HOSPITAL_COMMUNITY)
Admission: RE | Admit: 2022-03-10 | Discharge: 2022-03-10 | Disposition: A | Discharge status: Home / Self Care | Payer: Medicare Other | Attending: Cardiovascular Disease | Admitting: Cardiovascular Disease

## 2022-03-10 ENCOUNTER — Encounter (HOSPITAL_COMMUNITY)
Admission: RE | Disposition: A | Discharge status: Home / Self Care | Payer: Self-pay | Source: Home / Self Care | Attending: Cardiovascular Disease

## 2022-03-10 DIAGNOSIS — I2510 Atherosclerotic heart disease of native coronary artery without angina pectoris: Secondary | ICD-10-CM

## 2022-03-10 DIAGNOSIS — Z01812 Encounter for preprocedural laboratory examination: Secondary | ICD-10-CM

## 2022-03-10 DIAGNOSIS — Z87891 Personal history of nicotine dependence: Secondary | ICD-10-CM | POA: Diagnosis not present

## 2022-03-10 DIAGNOSIS — Z7982 Long term (current) use of aspirin: Secondary | ICD-10-CM | POA: Insufficient documentation

## 2022-03-10 DIAGNOSIS — E785 Hyperlipidemia, unspecified: Secondary | ICD-10-CM | POA: Diagnosis not present

## 2022-03-10 DIAGNOSIS — I4821 Permanent atrial fibrillation: Secondary | ICD-10-CM | POA: Diagnosis not present

## 2022-03-10 DIAGNOSIS — I2584 Coronary atherosclerosis due to calcified coronary lesion: Secondary | ICD-10-CM | POA: Insufficient documentation

## 2022-03-10 DIAGNOSIS — R9439 Abnormal result of other cardiovascular function study: Secondary | ICD-10-CM | POA: Diagnosis not present

## 2022-03-10 DIAGNOSIS — Z79899 Other long term (current) drug therapy: Secondary | ICD-10-CM | POA: Insufficient documentation

## 2022-03-10 DIAGNOSIS — E1151 Type 2 diabetes mellitus with diabetic peripheral angiopathy without gangrene: Secondary | ICD-10-CM | POA: Diagnosis not present

## 2022-03-10 DIAGNOSIS — Z7901 Long term (current) use of anticoagulants: Secondary | ICD-10-CM | POA: Diagnosis not present

## 2022-03-10 DIAGNOSIS — Z950 Presence of cardiac pacemaker: Secondary | ICD-10-CM | POA: Diagnosis not present

## 2022-03-10 DIAGNOSIS — I10 Essential (primary) hypertension: Secondary | ICD-10-CM | POA: Diagnosis not present

## 2022-03-10 DIAGNOSIS — Z7984 Long term (current) use of oral hypoglycemic drugs: Secondary | ICD-10-CM | POA: Insufficient documentation

## 2022-03-10 DIAGNOSIS — I495 Sick sinus syndrome: Secondary | ICD-10-CM | POA: Diagnosis not present

## 2022-03-10 HISTORY — PX: LEFT HEART CATH AND CORONARY ANGIOGRAPHY: CATH118249

## 2022-03-10 LAB — GLUCOSE, CAPILLARY: Glucose-Capillary: 105 mg/dL — ABNORMAL HIGH (ref 70–99)

## 2022-03-10 LAB — PROTIME-INR
INR: 1.2 (ref 0.8–1.2)
Prothrombin Time: 15 seconds (ref 11.4–15.2)

## 2022-03-10 SURGERY — LEFT HEART CATH AND CORONARY ANGIOGRAPHY
Anesthesia: LOCAL

## 2022-03-10 MED ORDER — VERAPAMIL HCL 2.5 MG/ML IV SOLN
INTRAVENOUS | Status: DC | PRN
  Administered 2022-03-10: 10 mL via INTRA_ARTERIAL

## 2022-03-10 MED ORDER — VERAPAMIL HCL 2.5 MG/ML IV SOLN
INTRAVENOUS | Status: AC
  Filled 2022-03-10: qty 2

## 2022-03-10 MED ORDER — HYDRALAZINE HCL 20 MG/ML IJ SOLN: 10.0000 mg | INTRAMUSCULAR | Status: DC | PRN

## 2022-03-10 MED ORDER — ONDANSETRON HCL 4 MG/2ML IJ SOLN: 4.0000 mg | Freq: Four times a day (QID) | INTRAMUSCULAR | Status: DC | PRN

## 2022-03-10 MED ORDER — FENTANYL CITRATE (PF) 100 MCG/2ML IJ SOLN
INTRAMUSCULAR | Status: DC | PRN
  Administered 2022-03-10: 25 ug via INTRAVENOUS

## 2022-03-10 MED ORDER — SODIUM CHLORIDE 0.9 % IV SOLN
INTRAVENOUS | Status: DC
Start: 2022-03-10 — End: 2022-03-10

## 2022-03-10 MED ORDER — SODIUM CHLORIDE 0.9 % IV SOLN: 250.0000 mL | INTRAVENOUS | Status: DC | PRN

## 2022-03-10 MED ORDER — IOHEXOL 350 MG/ML SOLN
INTRAVENOUS | Status: DC | PRN
  Administered 2022-03-10: 45 mL

## 2022-03-10 MED ORDER — SODIUM CHLORIDE 0.9% FLUSH
3.0000 mL | INTRAVENOUS | Status: DC | PRN
Start: 2022-03-10 — End: 2022-03-10

## 2022-03-10 MED ORDER — SODIUM CHLORIDE 0.9% FLUSH: 3.0000 mL | Freq: Two times a day (BID) | INTRAVENOUS | Status: DC

## 2022-03-10 MED ORDER — LIDOCAINE HCL (PF) 1 % IJ SOLN
INTRAMUSCULAR | Status: DC | PRN
  Administered 2022-03-10: 2 mL

## 2022-03-10 MED ORDER — ASPIRIN 81 MG PO CHEW: 81.0000 mg | CHEWABLE_TABLET | ORAL | Status: DC

## 2022-03-10 MED ORDER — LABETALOL HCL 5 MG/ML IV SOLN: 10.0000 mg | INTRAVENOUS | Status: DC | PRN

## 2022-03-10 MED ORDER — ASPIRIN 81 MG PO CHEW
81.0000 mg | CHEWABLE_TABLET | ORAL | Status: AC
  Administered 2022-03-10: 81 mg via ORAL
  Filled 2022-03-10: qty 1

## 2022-03-10 MED ORDER — SODIUM CHLORIDE 0.9 % WEIGHT BASED INFUSION: 1.0000 mL/kg/h | INTRAVENOUS | Status: DC

## 2022-03-10 MED ORDER — SODIUM CHLORIDE 0.9 % WEIGHT BASED INFUSION
3.0000 mL/kg/h | INTRAVENOUS | Status: AC
  Administered 2022-03-10: 3 mL/kg/h via INTRAVENOUS

## 2022-03-10 MED ORDER — LIDOCAINE HCL (PF) 1 % IJ SOLN
INTRAMUSCULAR | Status: AC
  Filled 2022-03-10: qty 30

## 2022-03-10 MED ORDER — HEPARIN SODIUM (PORCINE) 1000 UNIT/ML IJ SOLN
INTRAMUSCULAR | Status: DC | PRN
  Administered 2022-03-10: 4500 [IU] via INTRAVENOUS

## 2022-03-10 MED ORDER — MIDAZOLAM HCL 2 MG/2ML IJ SOLN
INTRAMUSCULAR | Status: DC | PRN
  Administered 2022-03-10: 1 mg via INTRAVENOUS

## 2022-03-10 MED ORDER — HEPARIN (PORCINE) IN NACL 1000-0.9 UT/500ML-% IV SOLN
INTRAVENOUS | Status: DC | PRN
  Administered 2022-03-10 (×2): 500 mL

## 2022-03-10 MED ORDER — ACETAMINOPHEN 325 MG PO TABS: 650.0000 mg | ORAL_TABLET | ORAL | Status: DC | PRN

## 2022-03-10 MED ORDER — MIDAZOLAM HCL 2 MG/2ML IJ SOLN
INTRAMUSCULAR | Status: AC
  Filled 2022-03-10: qty 2

## 2022-03-10 MED ORDER — FENTANYL CITRATE (PF) 100 MCG/2ML IJ SOLN
INTRAMUSCULAR | Status: AC
  Filled 2022-03-10: qty 2

## 2022-03-10 MED ORDER — SODIUM CHLORIDE 0.9% FLUSH: 3.0000 mL | INTRAVENOUS | Status: DC | PRN

## 2022-03-10 MED ORDER — HEPARIN (PORCINE) IN NACL 1000-0.9 UT/500ML-% IV SOLN
INTRAVENOUS | Status: AC
  Filled 2022-03-10: qty 1000

## 2022-03-10 MED ORDER — HEPARIN SODIUM (PORCINE) 1000 UNIT/ML IJ SOLN
INTRAMUSCULAR | Status: AC
  Filled 2022-03-10: qty 10

## 2022-03-10 SURGICAL SUPPLY — 10 items
BAND CMPR LRG ZPHR (HEMOSTASIS) ×1
BAND ZEPHYR COMPRESS 30 LONG (HEMOSTASIS) ×1 IMPLANT
CATH 5FR JL3.5 JR4 ANG PIG MP (CATHETERS) ×1 IMPLANT
GLIDESHEATH SLEND SS 6F .021 (SHEATH) ×1 IMPLANT
GUIDEWIRE INQWIRE 1.5J.035X260 (WIRE) IMPLANT
INQWIRE 1.5J .035X260CM (WIRE) ×2
KIT HEART LEFT (KITS) ×3 IMPLANT
PACK CARDIAC CATHETERIZATION (CUSTOM PROCEDURE TRAY) ×3 IMPLANT
TRANSDUCER W/STOPCOCK (MISCELLANEOUS) ×3 IMPLANT
TUBING CIL FLEX 10 FLL-RA (TUBING) ×3 IMPLANT

## 2022-03-10 NOTE — Progress Notes (Signed)
Patient and wife was given discharge instructions both verbalized understanding.

## 2022-03-10 NOTE — Discharge Instructions (Addendum)
Resume coumadin tonight.  Hold metformin 48 hours post cath  Drink plenty of fluid for 48 hours and keep wrist elevated at heart level for 24 hours  Radial Site Care   This sheet gives you information about how to care for yourself after your procedure. Your health care provider may also give you more specific instructions. If you have problems or questions, contact your health care provider. What can I expect after the procedure? After the procedure, it is common to have: Bruising and tenderness at the catheter insertion area. Follow these instructions at home: Medicines Take over-the-counter and prescription medicines only as told by your health care provider. Insertion site care Follow instructions from your health care provider about how to take care of your insertion site. Make sure you: Wash your hands with soap and water before you change your bandage (dressing). If soap and water are not available, use hand sanitizer. remove your dressing as told by your health care provider. In 24 hours Check your insertion site every day for signs of infection. Check for: Redness, swelling, or pain. Fluid or blood. Pus or a bad smell. Warmth. Do not take baths, swim, or use a hot tub until your health care provider approves. You may shower 24-48 hours after the procedure, or as directed by your health care provider. Remove the dressing and gently wash the site with plain soap and water. Pat the area dry with a clean towel. Do not rub the site. That could cause bleeding. Do not apply powder or lotion to the site. Activity   For 24 hours after the procedure, or as directed by your health care provider: Do not flex or bend the affected arm. Do not push or pull heavy objects with the affected arm. Do not drive yourself home from the hospital or clinic. You may drive 24 hours after the procedure unless your health care provider tells you not to. Do not operate machinery or power tools. Do  not lift anything that is heavier than 10 lb (4.5 kg), or the limit that you are told, until your health care provider says that it is safe. For 4 days Ask your health care provider when it is okay to: Return to work or school. Resume usual physical activities or sports. Resume sexual activity. General instructions If the catheter site starts to bleed, raise your arm and put firm pressure on the site. If the bleeding does not stop, get help right away. This is a medical emergency. If you went home on the same day as your procedure, a responsible adult should be with you for the first 24 hours after you arrive home. Keep all follow-up visits as told by your health care provider. This is important. Contact a health care provider if: You have a fever. You have redness, swelling, or yellow drainage around your insertion site. Get help right away if: You have unusual pain at the radial site. The catheter insertion area swells very fast. The insertion area is bleeding, and the bleeding does not stop when you hold steady pressure on the area. Your arm or hand becomes pale, cool, tingly, or numb. These symptoms may represent a serious problem that is an emergency. Do not wait to see if the symptoms will go away. Get medical help right away. Call your local emergency services (911 in the [place] Do not drive yourself to the hospital. Summary After the procedure, it is common to have bruising and tenderness at the site. Follow instructions from your  health care provider about how to take care of your radial site wound. Check the wound every day for signs of infection. Do not lift anything that is heavier than 10 lb (4.5 kg), or the limit that you are told, until your health care provider says that it is safe. This information is not intended to replace advice given to you by your health care provider. Make sure you discuss any questions you have with your health care provider. Document Revised:  10/04/2017 Document Reviewed: 10/04/2017 Elsevier Patient Education  2020 [employer].

## 2022-03-10 NOTE — Interval H&P Note (Signed)
History and Physical Interval Note:  03/10/2022 7:12 AM  Paul Dunn  has presented today for surgery, with the diagnosis of abnormal stress test.  The various methods of treatment have been discussed with the patient and family. After consideration of risks, benefits and other options for treatment, the patient has consented to  Procedure(s): LEFT HEART CATH AND CORONARY ANGIOGRAPHY (N/A) as a surgical intervention.  The patient's history has been reviewed, patient examined, no change in status, stable for surgery.  I have reviewed the patient's chart and labs.  Questions were answered to the patient's satisfaction.    Cath Lab Visit (complete for each Cath Lab visit)  Clinical Evaluation Leading to the Procedure:   ACS: No.  Non-ACS:    Anginal Classification: No Symptoms  Anti-ischemic medical therapy: Minimal Therapy (1 class of medications)  Non-Invasive Test Results: Intermediate-risk stress test findings: cardiac mortality 1-3%/year  Prior CABG: No previous CABG        Lauree Chandler

## 2022-03-11 NOTE — Progress Notes (Unsigned)
Cardiology Office Note    Date:  03/14/2022   ID:  Paul Dunn, DOB June 12, 1951, MRN [id number]  PCP:  Neale Burly, MD  Cardiologist: Jenkins Rouge, MD   EP: Dr. Benancio Deeds chief complaint on file.   History of Present Illness:    Paul Dunn is a 71 y.o. 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 Therefore, cath recommended   Cath 03/10/22 showed moderate CAD with tightest lesions in OM 65% most distal PLB 70% with major epicardial vessels being non critical Medical Rx recommended. Not having angina  Discussed checking with pharmacy to see if DOAC affordable    Past Medical History:  Diagnosis Date   Asthma    "when I was a child"   Atrial fibrillation ([place])    long standing persistent   Bulging of intervertebral disc between L4 and L5    GERD (gastroesophageal reflux disease)    Gouty arthritis    H/O hiatal hernia    High cholesterol    History of blood transfusion ~ 1984   History of bronchitis    "used to get it twice/year; last time was ~ 2 yr ago" (04/12/2012)   HTN (hypertension)    Kidney cysts    "never treated it"   Macular degeneration    Peripheral vascular disease ([place])    Pulmonary nodule    Skin cancer    Tachycardia-bradycardia syndrome ([place])    s/p PPM implant by Dr [name] 04/12/12   Type 2 diabetes mellitus ([place])     Past Surgical History:  Procedure Laterality Date   BIOPSY  01/18/2022   Procedure: BIOPSY;  Surgeon: Harvel Quale, MD;  [facility];  Service: Gastroenterology;;   CARDIAC CATHETERIZATION N/A 06/23/2016    Procedure: Left Heart Cath and Coronary Angiography;  Surgeon: Nelva Bush, MD;  Location: [facility];  Service: Cardiovascular;  Laterality: N/A;   CATARACT EXTRACTION, BILATERAL     COLONOSCOPY N/A 05/22/2019   Procedure: COLONOSCOPY;  Surgeon: Rogene Houston, MD;  [facility];  Service: Endoscopy;  Laterality: N/A;  930   ESOPHAGEAL DILATION  01/2012   ESOPHAGOGASTRODUODENOSCOPY (EGD) WITH PROPOFOL N/A 01/01/2021   Procedure: ESOPHAGOGASTRODUODENOSCOPY (EGD) WITH PROPOFOL;  Surgeon: Harvel Quale, MD;  [facility];  Service: Gastroenterology;  Laterality: N/A;  AM   ESOPHAGOGASTRODUODENOSCOPY (EGD) WITH PROPOFOL N/A 01/18/2022   Procedure: ESOPHAGOGASTRODUODENOSCOPY (EGD) WITH PROPOFOL;  Surgeon: Harvel Quale, MD;  [facility];  Service: Gastroenterology;  Laterality: N/A;  [place]  01/01/2021   Procedure: HEMOSTASIS CLIP PLACEMENT;  Surgeon: Harvel Quale, MD;  [facility];  Service: Gastroenterology;;   LACERATION REPAIR  ~ 1984   left forearm; "cut it w/a power saw"   LEFT HEART CATH AND CORONARY ANGIOGRAPHY N/A 03/10/2022   Procedure: LEFT HEART CATH AND CORONARY ANGIOGRAPHY;  Surgeon: Burnell Blanks, MD;  Location: [facility];  Service: Cardiovascular;  Laterality: N/A;   MELANOMA EXCISION  ~ 2011   left posterior shoulder   PACEMAKER INSERTION  04/12/2012  SJM [employer] DR RF pacemaker implanted by Dr [name] for tachy/brady syndrome   PERMANENT PACEMAKER INSERTION N/A 04/12/2012   Procedure: PERMANENT PACEMAKER INSERTION;  Surgeon: Thompson Grayer, MD;  [facility];  Service: Cardiovascular;  Laterality: N/A;   POLYPECTOMY  05/22/2019   Procedure: POLYPECTOMY;  Surgeon: Rogene Houston, MD;  [facility];  Service: Endoscopy;;  transverse, cecum,ascending,    POLYPECTOMY  01/01/2021   Procedure: POLYPECTOMY;  Surgeon: Harvel Quale,  MD;  [facility];  Service: Gastroenterology;;  duodenal gastric    Current Medications: Outpatient Medications Prior to Visit  Medication Sig Dispense Refill   allopurinol (ZYLOPRIM) 300 MG tablet Take 300 mg by mouth in the morning.     Calcium Carb-Cholecalciferol 600-500 MG-UNIT CAPS Take 1 tablet by mouth in the morning.     Carboxymeth-Glyc-Polysorb PF (REFRESH OPTIVE MEGA-3) 0.5-1-0.5 % SOLN Place 1 drop into both eyes in the morning.     Cholecalciferol (VITAMIN D) 125 MCG (5000 UT) CAPS Take 5,000 Units by mouth every evening.     clonazePAM (KLONOPIN) 1 MG tablet Take 1 mg by mouth at bedtime.     Cyanocobalamin (VITAMIN B 12 PO) Take 1 tablet by mouth daily. Once daily     diltiazem (CARDIZEM CD) 180 MG 24 hr capsule Take 360 mg by mouth in the morning.     famotidine (PEPCID) 40 MG tablet Take 1 tablet (40 mg total) by mouth at bedtime. 90 tablet 3   glipiZIDE (GLUCOTROL XL) 2.5 MG 24 hr tablet Take 2.5 mg by mouth in the morning.     metFORMIN (GLUCOPHAGE) 1000 MG tablet Take 1,000 mg by mouth 2 (two) times daily with a meal.     Multiple Vitamins-Minerals (PRESERVISION AREDS 2) CAPS Take 1 capsule by mouth in the morning and at bedtime.     olmesartan-hydrochlorothiazide (BENICAR HCT) 20-12.5 MG tablet Take 1 tablet by mouth in the morning.     Omega-3 Fatty Acids (FISH OIL) 1000 MG CAPS Take 1,000 mg by mouth in the morning and at bedtime.     omeprazole (PRILOSEC) 40 MG capsule Take one po daily. 90 capsule 3   rosuvastatin (CRESTOR) 5 MG tablet TAKE 1 TABLET EVERY MONDAY, WEDNESDAY, &FRIDAY (Patient taking differently: Take 5 mg by mouth every Monday, Wednesday, and Friday at 8 PM.) 38 tablet 2   triamcinolone cream (KENALOG) 0.1 % Apply 1 application. topically 2 (two) times daily as needed (dry/irritated skin).     warfarin (COUMADIN) 5 MG tablet Take 5-7.5 mg by mouth daily. Take 1 tablet (5 mg) by mouth on Sundays, Tuesdays, Wednesday and Friday  in the  evening. Take 1.5 tablets (7.5 mg) by mouth on Monday, Thursday & Saturday in the evening.     Zinc 50 MG TABS Take 50 mg by mouth in the morning.     No facility-administered medications prior to visit.     Allergies:   Shellfish allergy and Sudafed [pseudoephedrine]   Social History   Socioeconomic History   Marital status: Married    Spouse name: Not on file   Number of children: Not on file   Years of education: Not on file   Highest education level: Not on file  Occupational History   Not on file  Tobacco Use   Smoking status: Former    Packs/day: 1.00    Years: 2.00    Total pack years: 2.00    Types: Cigarettes    Start date:  09/12/1989    Quit date: 09/13/1991    Years since quitting: 30.5   Smokeless tobacco: Former    Types: Chew    Quit date: 09/12/1996  Vaping Use   Vaping Use: Never used  Substance and Sexual Activity   Alcohol use: No    Alcohol/week: 0.0 standard drinks of alcohol   Drug use: No   Sexual activity: Yes  Other Topics Concern   Not on file  Social History Narrative   Lives with wife in [place].  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[employer]   Social Determinants of Health   Financial Resource Strain: Not on file  Food Insecurity: Not on file  Transportation Needs: Not on file  Physical Activity: Not on file  Stress: Not on file  Social Connections: Not on file     Family History:  The patient's family history includes Coronary artery disease in his father, maternal grandmother, mother, paternal grandfather, and paternal grandmother; Diabetes in his sister; Hypertension in his brother and sister; Lung cancer in his father; Rheum arthritis in his sister; Stroke in his maternal grandfather; Ulcers in his father.   Review of Systems:    Please see the history of present illness.     All other systems reviewed and are otherwise negative except as noted above.   Physical Exam:    VS:  BP 120/70   Pulse 63   Ht '5\' 11"'$  (1.803  m)   Wt 194 lb 9.6 oz (88.3 kg)   SpO2 97%   BMI 27.14 kg/m    General: Well developed, well nourished,male appearing in no acute distress. Head: Normocephalic, atraumatic. Neck: No carotid bruits. JVD not elevated.  Lungs: Respirations regular and unlabored, without wheezes or rales.  Heart: Irregularly irregular. No S3 or S4.  No murmur, no rubs, or gallops appreciated. Abdomen: Appears non-distended. No obvious abdominal masses. Msk:  Strength and tone appear normal for age. No obvious joint deformities or effusions. Extremities: No clubbing or cyanosis. No pitting edema.  Distal pedal pulses are 2+ bilaterally. Neuro: Alert and oriented X 3. Moves all extremities spontaneously. No focal deficits noted. Psych:  Responds to questions appropriately with a normal affect. Skin: No rashes or lesions noted  Wt Readings from Last 3 Encounters:  03/14/22 194 lb 9.6 oz (88.3 kg)  03/10/22 194 lb (88 kg)  03/01/22 194 lb (88 kg)     Studies/Labs Reviewed:   EKG:  EKG is not ordered today. EKG from recent NST on 02/22/2022 reviewed which shows V-paced rhythm, HR 60 with underlying atrial fibrillation.   Recent Labs: 03/01/2022: BUN 22; Creatinine, Ser 1.16; Hemoglobin 14.2; Platelets 178; Potassium 3.9; Sodium 137   Lipid Panel No results found for: "CHOL", "TRIG", "HDL", "CHOLHDL", "VLDL", "LDLCALC", "LDLDIRECT"  Additional studies/ records that were reviewed today include:   LHC: 03/10/22  LEFT HEART CATH AND CORONARY ANGIOGRAPHY   Conclusion      RPAV lesion is 70% stenosed.   3rd RPL lesion is 70% stenosed.   Prox Cx lesion is 20% stenosed.   Dist RCA lesion is 10% stenosed.   Ost 2nd Mrg lesion is 40% stenosed.   Ost 3rd Mrg lesion is 30% stenosed.   Prox RCA to Mid RCA lesion is 20% stenosed.   RPDA lesion is 40% stenosed.   Mid Cx lesion is 50% stenosed.   2nd Mrg lesion is 65% stenosed.   Mid LAD to Dist LAD lesion is 40% stenosed.   Prox LAD  lesion is 50%  stenosed.   The left ventricular systolic function is normal.   LV end diastolic pressure is normal.   The left ventricular ejection fraction is 50-55% by visual estimate.   There is no mitral valve regurgitation.   The LAD has a calcified moderate proximal stenosis and diffuse moderate disease in the mid vessel. No change from last cath The Circumflex has diffuse moderate mid stenosis. Small to moderate caliber obtuse marginal branch with moderate to severe proximal stenosis.  The RCA is a large dominant, heavily calcified vessel. The right posterolateral artery has diffuse moderate stenosis, unchanged from last cath.  LV systolic function appears to be low normal.  Normal LVEDP   Recommendations: Inferior wall perfusion defect on nuclear stress test consistent with defect seen on scan in 2017. He has moderate CAD with moderately severe disease in the small to moderate caliber obtuse marginal branch. No high grade disease in the RCA or LAD. He is having no chest pain. LV function appears to be low normal. I would favor medical management of his CAD. If he were to develop angina, could consider PCI of the obtuse marginal branch.     Wall Motion              Left Heart  Left Ventricle The left ventricular size is normal. The left ventricular systolic function is normal. LV end diastolic pressure is normal. The left ventricular ejection fraction is 50-55% by visual estimate. No regional wall motion abnormalities. There is no evidence of mitral regurgitation.     Coronary Diagrams  Diagnostic Dominance: Right  Intervention   Flowsheet Row Most Recent Value  AO Systolic Pressure 95 mmHg  AO Diastolic Pressure 51 mmHg  AO Mean 70 mmHg  LV Systolic Pressure 678 mmHg  LV Diastolic Pressure 26 mmHg  LV EDP 12 mmHg  AOp Systolic Pressure 95 mmHg  AOp Diastolic Pressure 49 mmHg  AOp Mean Pressure 69 mmHg  LVp Systolic Pressure 938 mmHg  LVp Diastolic Pressure 3 mmHg  LVp EDP  Pressure 13 mmHg   Electronically signed by Burnell Blanks, MD on 03/10/22 at [phone] EDT     NST: 02/2022 Large fixed defect in the inferior segments and inferoseptum into the apex. Hypokinesis in this segments. The findings either represent infarct vs diaphragm attenuation.   LV perfusion is abnormal. There is no evidence of ischemia. There is evidence of infarction. Defect 1: There is a large defect with severe reduction in uptake present in the apical to basal inferior, inferoseptal and apex location(s) that is fixed. There is abnormal wall motion in the defect area. Consistent with infarction.   Left ventricular function is abnormal. Nuclear stress EF: 45 %. The left ventricular ejection fraction is mildly decreased (45-54%). End diastolic cavity size is mildly enlarged.   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 The study is intermediate risk.   Assessment:    No diagnosis found.    Plan:   In order of problems listed above:  1. CAD/Abnormal NST -  Moderate OM/distal PLB disease 50% proximal LAD medical Rx no angina and active continue medical Rx   2. Permanent Atrial Fibrillation - He denies any recent palpitations and HR is well-controlled in the 60's today. Continue Cardizem CD '180mg'$  daily.  - No reports of active bleeding. He remains on Coumadin for anticoagulation.  - change to DOAC if affordable   3. Symptomatic bradycardia - He is s/p St. Jude PPM placement which is followed by  Dr. [name]. Device interrogation in 12/2021 showed normal device function.   4. HTN - His BP is well-controlled at 122/64 during today's visit. Continue current medical therapy with Cardizem CD '180mg'$  daily and Olmesartan-HCTZ 20-12.'5mg'$  daily.   5. HLD - Followed by his PCP. Will request a copy of most recent labs. He remains on Crestor '5mg'$  MF as he has been intolerant to higher-intensity statin therapy in the past secondary to myalgias.     Medication Adjustments/Labs  and Tests Ordered: Current medicines are reviewed at length with the patient today.  Concerns regarding medicines are outlined above.  Medication changes, Labs and Tests ordered today are listed in the Patient Instructions below. There are no Patient Instructions on file for this visit.   Signed, Jenkins Rouge, MD  03/14/2022 1:32 PM    [facility] [address] [place], [place] [address] Phone: [phone] Fax: [phone]

## 2022-03-14 ENCOUNTER — Ambulatory Visit (INDEPENDENT_AMBULATORY_CARE_PROVIDER_SITE_OTHER): Payer: Medicare Other | Admitting: Cardiovascular Disease

## 2022-03-14 ENCOUNTER — Encounter: Payer: Self-pay | Admitting: Cardiovascular Disease

## 2022-03-14 ENCOUNTER — Ambulatory Visit (INDEPENDENT_AMBULATORY_CARE_PROVIDER_SITE_OTHER): Payer: Medicare Other

## 2022-03-14 VITALS — BP 120/70 | HR 63 | Ht 71.0 in | Wt 194.6 lb

## 2022-03-14 DIAGNOSIS — I495 Sick sinus syndrome: Secondary | ICD-10-CM

## 2022-03-14 DIAGNOSIS — R932 Abnormal findings on diagnostic imaging of liver and biliary tract: Secondary | ICD-10-CM | POA: Diagnosis not present

## 2022-03-14 DIAGNOSIS — I2510 Atherosclerotic heart disease of native coronary artery without angina pectoris: Secondary | ICD-10-CM | POA: Diagnosis not present

## 2022-03-14 DIAGNOSIS — E785 Hyperlipidemia, unspecified: Secondary | ICD-10-CM

## 2022-03-14 DIAGNOSIS — I4891 Unspecified atrial fibrillation: Secondary | ICD-10-CM

## 2022-03-14 NOTE — Patient Instructions (Signed)
Medication Instructions:  Your physician recommends that you continue on your current medications as directed. Please refer to the Current Medication list given to you today.  *If you need a refill on your cardiac medications before your next appointment, please call your pharmacy*   Lab Work: NONE   If you have labs (blood work) drawn today and your tests are completely normal, you will receive your results only by: [facility] [place] (if you have MyChart) OR A paper copy in the mail If you have any lab test that is abnormal or we need to change your treatment, we will call you to review the results.   Testing/Procedures: NONE    Follow-Up: At [facility], you and your health needs are our priority.  As part of our continuing mission to provide you with exceptional heart care, we have created designated Provider Care Teams.  These Care Teams include your primary Cardiologist (physician) and Advanced Practice Providers (APPs -  Physician Assistants and Nurse Practitioners) who all work together to provide you with the care you need, when you need it.  We recommend signing up for the patient portal called "MyChart".  Sign up information is provided on this After Visit Summary.  MyChart is used to connect with patients for Virtual Visits (Telemedicine).  Patients are able to view lab/test results, encounter notes, upcoming appointments, etc.  Non-urgent messages can be sent to your provider as well.   To learn more about what you can do with MyChart, go to [email or web address].    Your next appointment:   6 month(s)  The format for your next appointment:   In Person  Provider:   Jenkins Rouge, MD    Other Instructions Thank you for choosing [facility]!    Important Information About Sugar

## 2022-03-15 LAB — CUP PACEART REMOTE DEVICE CHECK
Battery Remaining Longevity: 35 mo
Battery Remaining Percentage: 27 %
Battery Voltage: 2.86 V
Brady Statistic RV Percent Paced: 97 %
Date Time Interrogation Session: 20230703031423
Implantable Lead Implant Date: 20130801
Implantable Lead Implant Date: 20130801
Implantable Lead Location: 753859
Implantable Lead Location: 753860
Implantable Lead Model: 1948
Implantable Pulse Generator Implant Date: 20130801
Lead Channel Impedance Value: 510 Ohm
Lead Channel Pacing Threshold Amplitude: 1 V
Lead Channel Pacing Threshold Pulse Width: 0.4 ms
Lead Channel Sensing Intrinsic Amplitude: 12 mV
Lead Channel Setting Pacing Amplitude: 2.5 V
Lead Channel Setting Pacing Pulse Width: 0.4 ms
Lead Channel Setting Sensing Sensitivity: 2 mV
Pulse Gen Model: 2210
Pulse Gen Serial Number: 7377947

## 2022-03-18 ENCOUNTER — Telehealth (INDEPENDENT_AMBULATORY_CARE_PROVIDER_SITE_OTHER): Payer: Self-pay

## 2022-03-18 NOTE — Telephone Encounter (Signed)
Patient called today wanting the result of labs from Monday. The Fibrosis test is still pending, but Pt/Inr is out of range and the creatinine has jumped up in the last two months. Please advise.

## 2022-03-18 NOTE — Telephone Encounter (Signed)
Called patient today and discussed results which showed very mildly elevated INR and normal albumin, AST and ALT.  He will follow his PCP regarding his elevated creatinine.  We will reach him once we receive the results of his FibroTest.

## 2022-03-21 LAB — LIVER FIBROSIS, FIBROTEST-ACTITEST
ALT: 23 U/L (ref 9–46)
Alpha-2-Macroglobulin: 243 mg/dL (ref 106–279)
Apolipoprotein A1: 141 mg/dL (ref 94–176)
Bilirubin: 0.4 mg/dL (ref 0.2–1.2)
Fibrosis Score: 0.43
GGT: 29 U/L (ref 3–70)
Haptoglobin: 105 mg/dL (ref 43–212)
Necroinflammat ACT Score: 0.12
Reference ID: 4445578

## 2022-03-21 LAB — PROTIME-INR
INR: 1.2 — ABNORMAL HIGH
Prothrombin Time: 12.9 s — ABNORMAL HIGH (ref 9.0–11.5)

## 2022-03-21 LAB — COMPREHENSIVE METABOLIC PANEL
AG Ratio: 2.4 (calc) (ref 1.0–2.5)
ALT: 23 U/L (ref 9–46)
AST: 20 U/L (ref 10–35)
Albumin: 4.4 g/dL (ref 3.6–5.1)
Alkaline phosphatase (APISO): 20 U/L — ABNORMAL LOW (ref 35–144)
BUN/Creatinine Ratio: 14 (calc) (ref 6–22)
BUN: 23 mg/dL (ref 7–25)
CO2: 23 mmol/L (ref 20–32)
Calcium: 9.4 mg/dL (ref 8.6–10.3)
Chloride: 106 mmol/L (ref 98–110)
Creat: 1.64 mg/dL — ABNORMAL HIGH (ref 0.70–1.28)
Globulin: 1.8 g/dL (calc) — ABNORMAL LOW (ref 1.9–3.7)
Glucose, Bld: 79 mg/dL (ref 65–139)
Potassium: 4.3 mmol/L (ref 3.5–5.3)
Sodium: 140 mmol/L (ref 135–146)
Total Bilirubin: 0.5 mg/dL (ref 0.2–1.2)
Total Protein: 6.2 g/dL (ref 6.1–8.1)

## 2022-03-24 DIAGNOSIS — Z7901 Long term (current) use of anticoagulants: Secondary | ICD-10-CM | POA: Diagnosis not present

## 2022-04-07 NOTE — Progress Notes (Signed)
Remote pacemaker transmission.   

## 2022-05-03 DIAGNOSIS — I4891 Unspecified atrial fibrillation: Secondary | ICD-10-CM | POA: Diagnosis not present

## 2022-05-03 DIAGNOSIS — E1142 Type 2 diabetes mellitus with diabetic polyneuropathy: Secondary | ICD-10-CM | POA: Diagnosis not present

## 2022-05-03 DIAGNOSIS — E782 Mixed hyperlipidemia: Secondary | ICD-10-CM | POA: Diagnosis not present

## 2022-05-03 DIAGNOSIS — K2100 Gastro-esophageal reflux disease with esophagitis, without bleeding: Secondary | ICD-10-CM | POA: Diagnosis not present

## 2022-05-03 DIAGNOSIS — I10 Essential (primary) hypertension: Secondary | ICD-10-CM | POA: Diagnosis not present

## 2022-05-03 DIAGNOSIS — M1009 Idiopathic gout, multiple sites: Secondary | ICD-10-CM | POA: Diagnosis not present

## 2022-05-20 ENCOUNTER — Telehealth: Payer: Self-pay

## 2022-05-20 ENCOUNTER — Telehealth: Payer: Self-pay | Admitting: Cardiovascular Disease

## 2022-05-20 ENCOUNTER — Other Ambulatory Visit: Payer: Self-pay

## 2022-05-20 ENCOUNTER — Encounter (HOSPITAL_COMMUNITY): Payer: Self-pay

## 2022-05-20 ENCOUNTER — Encounter (HOSPITAL_COMMUNITY): Payer: Self-pay | Admitting: *Deleted

## 2022-05-20 ENCOUNTER — Emergency Department (HOSPITAL_COMMUNITY)
Admission: EM | Admit: 2022-05-20 | Discharge: 2022-05-20 | Disposition: A | Discharge status: Home / Self Care | Payer: Medicare Other | Attending: Emergency Medicine | Admitting: Emergency Medicine

## 2022-05-20 ENCOUNTER — Emergency Department (HOSPITAL_COMMUNITY): Payer: Medicare Other

## 2022-05-20 DIAGNOSIS — R0789 Other chest pain: Secondary | ICD-10-CM | POA: Diagnosis not present

## 2022-05-20 DIAGNOSIS — I2510 Atherosclerotic heart disease of native coronary artery without angina pectoris: Secondary | ICD-10-CM | POA: Diagnosis not present

## 2022-05-20 DIAGNOSIS — Z7984 Long term (current) use of oral hypoglycemic drugs: Secondary | ICD-10-CM | POA: Diagnosis not present

## 2022-05-20 DIAGNOSIS — Z7901 Long term (current) use of anticoagulants: Secondary | ICD-10-CM | POA: Diagnosis not present

## 2022-05-20 DIAGNOSIS — R079 Chest pain, unspecified: Secondary | ICD-10-CM | POA: Diagnosis not present

## 2022-05-20 LAB — CBC
HCT: 41.6 % (ref 39.0–52.0)
Hemoglobin: 14.1 g/dL (ref 13.0–17.0)
MCH: 30.7 pg (ref 26.0–34.0)
MCHC: 33.9 g/dL (ref 30.0–36.0)
MCV: 90.4 fL (ref 80.0–100.0)
Platelets: 171 10*3/uL (ref 150–400)
RBC: 4.6 MIL/uL (ref 4.22–5.81)
RDW: 14.8 % (ref 11.5–15.5)
WBC: 7.7 10*3/uL (ref 4.0–10.5)
nRBC: 0 % (ref 0.0–0.2)

## 2022-05-20 LAB — BASIC METABOLIC PANEL
Anion gap: 10 (ref 5–15)
BUN: 23 mg/dL (ref 8–23)
CO2: 25 mmol/L (ref 22–32)
Calcium: 9.5 mg/dL (ref 8.9–10.3)
Chloride: 105 mmol/L (ref 98–111)
Creatinine, Ser: 1.04 mg/dL (ref 0.61–1.24)
GFR, Estimated: >60 mL/min (ref >60)
Glucose, Bld: 97 mg/dL (ref 70–99)
Potassium: 4.2 mmol/L (ref 3.5–5.1)
Sodium: 140 mmol/L (ref 135–145)

## 2022-05-20 LAB — CBG MONITORING, ED: Glucose-Capillary: 88 mg/dL (ref 70–99)

## 2022-05-20 LAB — TROPONIN I (HIGH SENSITIVITY)
Troponin I (High Sensitivity): 5 ng/L (ref <18)
Troponin I (High Sensitivity): 5 ng/L (ref <18)

## 2022-05-20 NOTE — Telephone Encounter (Signed)
Patient called reports of chest pressure "feels like something laying in the center of my chest." Patient advised he needs to go to the Emergency Department ([facility] if he is able to to there so our providers can see him if needed considering recent VT event noted on his device. Patient states he does not have anyone to drive him, he would have to drive. Advised patient he does not need to drive, call [phone]. Voiced understanding. Patient states he is agreeable to plan and if he decides to go to [facility] he will call back. Otherwise he will go to [facility].

## 2022-05-20 NOTE — Telephone Encounter (Signed)
Patient returned RN's call. 

## 2022-05-20 NOTE — Telephone Encounter (Signed)
Patient called again stating he has been belching lot and feeling heaviness in the center of his chest.

## 2022-05-20 NOTE — Telephone Encounter (Signed)
Pt left a message stating he is going to [facility].

## 2022-05-20 NOTE — Telephone Encounter (Signed)
Noted! Thank you

## 2022-05-20 NOTE — ED Provider Notes (Signed)
[facility] Provider Note   CSN: [id number] Arrival date & time: 05/20/22  1249     History {Add pertinent medical, surgical, social history, OB history to HPI:1} Chief Complaint  Patient presents with   Chest Pain    Paul Dunn is a 71 y.o. male.  Patient states that he was called by his pacemaker people who stated he should go to the doctor.  Also has been having chest discomfort   Chest Pain      Home Medications Prior to Admission medications   Medication Sig Start Date End Date Taking? Authorizing Provider  allopurinol (ZYLOPRIM) 300 MG tablet Take 300 mg by mouth in the morning.   Yes [provider]  Calcium Carb-Cholecalciferol 600-500 MG-UNIT CAPS Take 1 tablet by mouth in the morning.   Yes [provider]  Carboxymeth-Glyc-Polysorb PF (REFRESH OPTIVE MEGA-3) 0.5-1-0.5 % SOLN Place 1 drop into both eyes in the morning.   Yes [provider]  Cholecalciferol (VITAMIN D) 125 MCG (5000 UT) CAPS Take 5,000 Units by mouth every evening.   Yes [provider]  clonazePAM (KLONOPIN) 1 MG tablet Take 1 mg by mouth at bedtime.   Yes [provider]  Cyanocobalamin (VITAMIN B 12 PO) Take 1 tablet by mouth daily. Once daily   Yes [provider]  diltiazem (CARDIZEM CD) 180 MG 24 hr capsule Take 360 mg by mouth in the morning. 11/20/21  Yes [provider]  famotidine (PEPCID) 40 MG tablet Take 1 tablet (40 mg total) by mouth at bedtime. 01/18/22  Yes Harvel Quale, MD  glipiZIDE (GLUCOTROL XL) 2.5 MG 24 hr tablet Take 2.5 mg by mouth in the morning.   Yes [provider]  metFORMIN (GLUCOPHAGE) 1000 MG tablet Take 1,000 mg by mouth 2 (two) times daily with a meal. 01/08/21  Yes [provider]  Multiple Vitamins-Minerals (PRESERVISION AREDS 2) CAPS Take 1 capsule by mouth in the morning and at bedtime.   Yes [provider]  olmesartan-hydrochlorothiazide  (BENICAR HCT) 20-12.5 MG tablet Take 1 tablet by mouth in the morning. 10/23/21  Yes [provider]  Omega-3 Fatty Acids (FISH OIL) 1000 MG CAPS Take 1,000 mg by mouth in the morning and at bedtime.   Yes [provider]  omeprazole (PRILOSEC) 40 MG capsule Take one po daily. 08/25/21  Yes Harvel Quale, MD  rosuvastatin (CRESTOR) 5 MG tablet TAKE 1 TABLET EVERY MONDAY, WEDNESDAY, &FRIDAY Patient taking differently: Take 5 mg by mouth every Monday, Wednesday, and Friday at 8 PM. 05/28/18  Yes Allred, Jeneen Rinks, MD  warfarin (COUMADIN) 5 MG tablet Take 5-7.5 mg by mouth daily. Take 1 tablet (5 mg) by mouth on Sundays, Tuesdays, Wednesday and Friday  in the evening. Take 1.5 tablets (7.5 mg) by mouth on Monday, Thursday & Saturday in the evening.   Yes [provider]  Zinc 50 MG TABS Take 50 mg by mouth in the morning.   Yes [provider]      Allergies    Shellfish allergy and Sudafed [pseudoephedrine]    Review of Systems   Review of Systems  Cardiovascular:  Positive for chest pain.    Physical Exam Updated Vital Signs BP 137/79   Pulse 64   Temp 97.6 F (36.4 C) (Oral)   Resp 13   Ht '5\' 11"'$  (1.803 m)   Wt 88.5 kg   SpO2 100%   BMI 27.20 kg/m  Physical Exam  ED  Results / Procedures / Treatments   Labs (all labs ordered are listed, but only abnormal results are displayed) Labs Reviewed  BASIC METABOLIC PANEL  CBC  CBG MONITORING, ED  TROPONIN I (HIGH SENSITIVITY)  TROPONIN I (HIGH SENSITIVITY)    EKG EKG Interpretation  Date/Time:  Friday May 20 2022 13:00:03 EDT Ventricular Rate:  60 PR Interval:    QRS Duration: 208 QT Interval:  490 QTC Calculation: 490 R Axis:   -82 Text Interpretation: Ventricular-paced rhythm Abnormal ECG When compared with ECG of 10-Mar-2022 06:28, No significant change was found Confirmed by Milton Ferguson [phone]) on 05/20/2022 3:10:39 PM  Radiology DG Chest 2 View  Result Date:  05/20/2022 CLINICAL DATA:  Chest pain EXAM: CHEST - 2 VIEW COMPARISON:  04/13/2012 FINDINGS: Cardiac size is within normal limits. Lung fields are clear of any infiltrates or pulmonary edema. There is no pleural effusion or pneumothorax. In the PA view, there are faint subcentimeter nodular densities in both lower lung fields. Repeat PA view with nipple markers may be considered. IMPRESSION: There are no signs of pulmonary edema or focal pulmonary consolidation. There are faint subcentimeter nodules in both lower lung fields, possibly nipple shadows or new parenchymal nodules. Repeat PA view with nipple markers may be considered. Electronically Signed   By: Elmer Picker M.D.   On: 05/20/2022 13:19    Procedures Procedures  {Document cardiac monitor, telemetry assessment procedure when appropriate:1}  Medications Ordered in ED Medications - No data to display  ED Course/ Medical Decision Making/ A&P                           Medical Decision Making Amount and/or Complexity of Data Reviewed Labs: ordered. Radiology: ordered.   Patient with chest pain but normal troponin and questionable V. tach for 50 beats today.  I spoke with Dr. Johnsie Cancel cardiology and he stated the patient can follow-up with the cardiology group next week  {Document critical care time when appropriate:1} {Document review of labs and clinical decision tools ie heart score, Chads2Vasc2 etc:1}  {Document your independent review of radiology images, and any outside records:1} {Document your discussion with family members, caretakers, and with consultants:1} {Document social determinants of health affecting pt's care:1} {Document your decision making why or why not admission, treatments were needed:1} Final Clinical Impression(s) / ED Diagnoses Final diagnoses:  Atypical chest pain    Rx / DC Orders ED Discharge Orders     None

## 2022-05-20 NOTE — ED Notes (Addendum)
05/20/2022 2100hrs. HR of 220 noted. Approximately 40-50 beats  A-fib is baseline at controlled rate.   According to Paul Dunn w/ [employer]

## 2022-05-20 NOTE — ED Triage Notes (Signed)
Pt woke up with mid chest pressure, + belching, denies any SOB, denies N/V. Pt has a pacemaker.

## 2022-05-20 NOTE — Discharge Instructions (Signed)
Follow-up with your cardiologist next week.  Return here sooner if any problems

## 2022-05-20 NOTE — Telephone Encounter (Addendum)
[employer].  Device alert for HVR EGM show AF with likely VT, abrupt onset, self limiting, >20 beats. Known AF, Warfarin.  Confirmed with Dr. Curt Bears that episdoes appears AF w/ separate VT tachycardia. Device is PPM, therefor therapies not available. Needs follow up with EP APP per Dr. Curt Bears. Patient is [place] patient and has apt with Dr. Myles Gip in November but explained to patient that he needs to be sooner than November. Patient denies any symptoms. Advised to call if any changes occur. Voiced understanding.

## 2022-05-24 NOTE — Progress Notes (Unsigned)
Electrophysiology Office Note Date: 05/24/2022  ID:  Paul Dunn, DOB 1950-09-22, MRN [id number]  PCP: Neale Burly, MD Primary Cardiologist: Jenkins Rouge, MD Electrophysiologist: Thompson Grayer, MD -> Dr. Myles Gip  CC: Pacemaker follow-up  Paul Dunn is a 71 y.o. male seen today for Thompson Grayer, MD for post hospital follow up.    Seen in ED 05/20/2022 with atypical chest pain. Labs were unrermarkable. Phone note 9/8 shows approx 13s/45-50 beats of likely VT/ dual tachycardia. (See below)  Since discharge from hospital the patient reports doing well. No further chest discomfort. He denies syncope or undue SOB. No edema  Cath 02/2022 with mostly Moderate + CAD with 1 borderline OM lesion. Medical therapy recommended unless symptoms worsened or changed.   Device History: St. Jude Dual Chamber PPM implanted 04/2012 for sinus bradycardia  Past Medical History:  Diagnosis Date   Asthma    "when I was a child"   Atrial fibrillation ([place])    long standing persistent   Bulging of intervertebral disc between L4 and L5    GERD (gastroesophageal reflux disease)    Gouty arthritis    H/O hiatal hernia    High cholesterol    History of blood transfusion ~ 1984   History of bronchitis    "used to get it twice/year; last time was ~ 2 yr ago" (04/12/2012)   HTN (hypertension)    Kidney cysts    "never treated it"   Macular degeneration    Peripheral vascular disease ([place])    Pulmonary nodule    Skin cancer    Tachycardia-bradycardia syndrome ([place])    s/p PPM implant by Dr [name] 04/12/12   Type 2 diabetes mellitus ([place])    Past Surgical History:  Procedure Laterality Date   BIOPSY  01/18/2022   Procedure: BIOPSY;  Surgeon: Harvel Quale, MD;  [facility];  Service: Gastroenterology;;   CARDIAC CATHETERIZATION N/A 06/23/2016   Procedure: Left Heart Cath and Coronary Angiography;  Surgeon: Nelva Bush, MD;  Location: [place] [facility];  Service:  Cardiovascular;  Laterality: N/A;   CATARACT EXTRACTION, BILATERAL     COLONOSCOPY N/A 05/22/2019   Procedure: COLONOSCOPY;  Surgeon: Rogene Houston, MD;  [facility];  Service: Endoscopy;  Laterality: N/A;  930   ESOPHAGEAL DILATION  01/2012   ESOPHAGOGASTRODUODENOSCOPY (EGD) WITH PROPOFOL N/A 01/01/2021   Procedure: ESOPHAGOGASTRODUODENOSCOPY (EGD) WITH PROPOFOL;  Surgeon: Harvel Quale, MD;  [facility];  Service: Gastroenterology;  Laterality: N/A;  AM   ESOPHAGOGASTRODUODENOSCOPY (EGD) WITH PROPOFOL N/A 01/18/2022   Procedure: ESOPHAGOGASTRODUODENOSCOPY (EGD) WITH PROPOFOL;  Surgeon: Harvel Quale, MD;  [facility];  Service: Gastroenterology;  Laterality: N/A;  [place]  01/01/2021   Procedure: HEMOSTASIS CLIP PLACEMENT;  Surgeon: Harvel Quale, MD;  [facility];  Service: Gastroenterology;;   LACERATION REPAIR  ~ 1984   left forearm; "cut it w/a power saw"   LEFT HEART CATH AND CORONARY ANGIOGRAPHY N/A 03/10/2022   Procedure: LEFT HEART CATH AND CORONARY ANGIOGRAPHY;  Surgeon: Burnell Blanks, MD;  Location: [place] [facility];  Service: Cardiovascular;  Laterality: N/A;   MELANOMA EXCISION  ~ 2011   left posterior shoulder   PACEMAKER INSERTION  04/12/2012   SJM Accent DR RF pacemaker implanted by Dr [name] for tachy/brady syndrome   PERMANENT PACEMAKER INSERTION N/A 04/12/2012   Procedure: PERMANENT PACEMAKER INSERTION;  Surgeon: Thompson Grayer, MD;  Location: [facility];  Service: Cardiovascular;  Laterality: N/A;   POLYPECTOMY  05/22/2019   Procedure: POLYPECTOMY;  Surgeon: Rogene Houston, MD;  [facility];  Service: Endoscopy;;  transverse, cecum,ascending,    POLYPECTOMY  01/01/2021   Procedure: POLYPECTOMY;  Surgeon: Harvel Quale, MD;  [facility];  Service: Gastroenterology;;  duodenal gastric    Current Outpatient Medications   Medication Sig Dispense Refill   allopurinol (ZYLOPRIM) 300 MG tablet Take 300 mg by mouth in the morning.     Calcium Carb-Cholecalciferol 600-500 MG-UNIT CAPS Take 1 tablet by mouth in the morning.     Carboxymeth-Glyc-Polysorb PF (REFRESH OPTIVE MEGA-3) 0.5-1-0.5 % SOLN Place 1 drop into both eyes in the morning.     Cholecalciferol (VITAMIN D) 125 MCG (5000 UT) CAPS Take 5,000 Units by mouth every evening.     clonazePAM (KLONOPIN) 1 MG tablet Take 1 mg by mouth at bedtime.     Cyanocobalamin (VITAMIN B 12 PO) Take 1 tablet by mouth daily. Once daily     diltiazem (CARDIZEM CD) 180 MG 24 hr capsule Take 360 mg by mouth in the morning.     famotidine (PEPCID) 40 MG tablet Take 1 tablet (40 mg total) by mouth at bedtime. 90 tablet 3   glipiZIDE (GLUCOTROL XL) 2.5 MG 24 hr tablet Take 2.5 mg by mouth in the morning.     metFORMIN (GLUCOPHAGE) 1000 MG tablet Take 1,000 mg by mouth 2 (two) times daily with a meal.     Multiple Vitamins-Minerals (PRESERVISION AREDS 2) CAPS Take 1 capsule by mouth in the morning and at bedtime.     olmesartan-hydrochlorothiazide (BENICAR HCT) 20-12.5 MG tablet Take 1 tablet by mouth in the morning.     Omega-3 Fatty Acids (FISH OIL) 1000 MG CAPS Take 1,000 mg by mouth in the morning and at bedtime.     omeprazole (PRILOSEC) 40 MG capsule Take one po daily. 90 capsule 3   rosuvastatin (CRESTOR) 5 MG tablet TAKE 1 TABLET EVERY MONDAY, WEDNESDAY, &FRIDAY (Patient taking differently: Take 5 mg by mouth every Monday, Wednesday, and Friday at 8 PM.) 38 tablet 2   warfarin (COUMADIN) 5 MG tablet Take 5-7.5 mg by mouth daily. Take 1 tablet (5 mg) by mouth on Sundays, Tuesdays, Wednesday and Friday  in the evening. Take 1.5 tablets (7.5 mg) by mouth on Monday, Thursday & Saturday in the evening.     Zinc 50 MG TABS Take 50 mg by mouth in the morning.     No current facility-administered medications for this visit.    Allergies:   Shellfish allergy and Sudafed  [pseudoephedrine]   Social History: Social History   Socioeconomic History   Marital status: Married    Spouse name: Not on file   Number of children: Not on file   Years of education: Not on file   Highest education level: Not on file  Occupational History   Not on file  Tobacco Use   Smoking status: Former    Packs/day: 1.00    Years: 2.00    Total pack years: 2.00    Types: Cigarettes    Start date: 09/12/1989    Quit date: 09/13/1991    Years since quitting: 30.7   Smokeless tobacco: Former    Types: Chew    Quit date: 09/12/1996  Vaping Use   Vaping Use: Never used  Substance and Sexual Activity   Alcohol use: No    Alcohol/week: 0.0  standard drinks of alcohol   Drug use: No   Sexual activity: Yes  Other Topics Concern   Not on file  Social History Narrative   Lives with wife in [place].  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[employer]   Social Determinants of Health   Financial Resource Strain: Not on file  Food Insecurity: Not on file  Transportation Needs: Not on file  Physical Activity: Not on file  Stress: Not on file  Social Connections: Not on file  Intimate Partner Violence: Not on file    Family History: Family History  Problem Relation Age of Onset   Coronary artery disease Mother    Coronary artery disease Father    Ulcers Father    Lung cancer Father    Diabetes Sister    Hypertension Sister    Rheum arthritis Sister    Hypertension Brother    Coronary artery disease Maternal Grandmother    Stroke Maternal Grandfather    Coronary artery disease Paternal Grandmother    Coronary artery disease Paternal Grandfather      Review of Systems: All other systems reviewed and are otherwise negative except as noted above.  Physical Exam: Vitals:   05/25/22 1218  BP: 120/66  Pulse: 86  SpO2: 98%  Weight: 193 lb 12.8 oz (87.9 kg)  Height: '5\' 11"'$  (1.803 m)     GEN- The patient is well appearing, alert and oriented x 3 today.   HEENT:  normocephalic, atraumatic; sclera clear, conjunctiva pink; hearing intact; oropharynx clear; neck supple, no JVP Lymph- no cervical lymphadenopathy Lungs- Clear to ausculation bilaterally, normal work of breathing.  No wheezes, rales, rhonchi Heart- Regular  rate and rhythm (V paced), no murmurs, rubs or gallops, PMI not laterally displaced GI- soft, non-tender, non-distended, bowel sounds present, no hepatosplenomegaly Extremities- no clubbing or cyanosis. No peripheral edema; DP/PT/radial pulses 2+ bilaterally MS- no significant deformity or atrophy Skin- warm and dry, no rash or lesion; PPM pocket well healed Psych- euthymic mood, full affect Neuro- strength and sensation are intact  PPM Interrogation-  reviewed in detail today,  See PACEART report.  EKG:  EKG is not ordered today. Personal review of ekg ordered  9/8  shows V pacing at 60 bpm   Recent Labs: 03/14/2022: ALT 23; ALT 23 05/20/2022: BUN 23; Creatinine, Ser 1.04; Hemoglobin 14.1; Platelets 171; Potassium 4.2; Sodium 140   Wt Readings from Last 3 Encounters:  05/20/22 195 lb (88.5 kg)  03/14/22 194 lb 9.6 oz (88.3 kg)  03/10/22 194 lb (88 kg)     Other studies Reviewed: Additional studies/ records that were reviewed today include: Previous EP office notes, Previous remote checks, Most recent labwork.   LHC 02/2022   RPAV lesion is 70% stenosed.   3rd RPL lesion is 70% stenosed.   Prox Cx lesion is 20% stenosed.   Dist RCA lesion is 10% stenosed.   Ost 2nd Mrg lesion is 40% stenosed.   Ost 3rd Mrg lesion is 30% stenosed.   Prox RCA to Mid RCA lesion is 20% stenosed.   RPDA lesion is 40% stenosed.   Mid Cx lesion is 50% stenosed.   2nd Mrg lesion is 65% stenosed.   Mid LAD to Dist LAD lesion is 40% stenosed.   Prox LAD lesion is 50% stenosed.   The left ventricular systolic function is normal.   LV end diastolic pressure is normal.   The left ventricular ejection fraction is 50-55% by visual estimate.   There  is no  mitral valve regurgitation.   The LAD has a calcified moderate proximal stenosis and diffuse moderate disease in the mid vessel. No change from last cath The Circumflex has diffuse moderate mid stenosis. Small to moderate caliber obtuse marginal branch with moderate to severe proximal stenosis.  The RCA is a large dominant, heavily calcified vessel. The right posterolateral artery has diffuse moderate stenosis, unchanged from last cath.  LV systolic function appears to be low normal.  Normal LVEDP  Assessment and Plan:  1. Symptomatic bradycardia s/p St. Jude PPM  Normal PPM function See Pace Art report No changes today  2. Permanent afib Rate controlled overall Continue coumadin with chads2vasc score of 4   3. 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  Consider mexitil vs amio if recurs Changed VT detection to >30 seconds. Update Echo Ultimately he may require upgrade to ICD.   5. HTN Stable on current regimen    6. CAD Denies s/s ischemia Will see if Dr. Angelena Form recommends closer look a ostial lesion; though wouldn't expect unless ischemic symptoms or rhythm recurs.  Update Echo.  Myoview 02/2022 with EF ~45% and prior infarct, no ischemia.  Cath 03/10/2022 with moderate diffuse disease as above with borderline OM lesion.   7. HL Continue crestor '5mg'$  daily  Current medicines are reviewed at length with the patient today.    Labs from ED visit reviewed and stable.    Disposition:   Follow up with Dr. Myles Gip  in 4 weeks. Alarm symptoms given.     Jacalyn Lefevre, PA-C  05/24/2022 10:22 PM  [address] [id number] [phone] (office) [phone] (fax)

## 2022-05-25 ENCOUNTER — Ambulatory Visit: Payer: Medicare Other | Attending: Student | Admitting: Student

## 2022-05-25 ENCOUNTER — Encounter: Payer: Self-pay | Admitting: Student

## 2022-05-25 VITALS — BP 120/66 | HR 86 | Ht 71.0 in | Wt 193.8 lb

## 2022-05-25 DIAGNOSIS — R079 Chest pain, unspecified: Secondary | ICD-10-CM | POA: Diagnosis not present

## 2022-05-25 DIAGNOSIS — I4891 Unspecified atrial fibrillation: Secondary | ICD-10-CM | POA: Insufficient documentation

## 2022-05-25 DIAGNOSIS — I2510 Atherosclerotic heart disease of native coronary artery without angina pectoris: Secondary | ICD-10-CM | POA: Diagnosis not present

## 2022-05-25 DIAGNOSIS — I10 Essential (primary) hypertension: Secondary | ICD-10-CM | POA: Diagnosis not present

## 2022-05-25 LAB — CUP PACEART INCLINIC DEVICE CHECK
Battery Remaining Longevity: 31 mo
Battery Voltage: 2.86 V
Brady Statistic RA Percent Paced: 0 %
Brady Statistic RV Percent Paced: 98 %
Date Time Interrogation Session: 20230913124001
Implantable Lead Implant Date: 20130801
Implantable Lead Implant Date: 20130801
Implantable Lead Location: 753859
Implantable Lead Location: 753860
Implantable Lead Model: 1948
Implantable Pulse Generator Implant Date: 20130801
Lead Channel Impedance Value: 525 Ohm
Lead Channel Pacing Threshold Amplitude: 1 V
Lead Channel Pacing Threshold Amplitude: 1 V
Lead Channel Pacing Threshold Pulse Width: 0.4 ms
Lead Channel Pacing Threshold Pulse Width: 0.4 ms
Lead Channel Sensing Intrinsic Amplitude: 12 mV
Lead Channel Sensing Intrinsic Amplitude: 3.6 mV
Lead Channel Setting Pacing Amplitude: 2.5 V
Lead Channel Setting Pacing Pulse Width: 0.4 ms
Lead Channel Setting Sensing Sensitivity: 2 mV
Pulse Gen Model: 2210
Pulse Gen Serial Number: 7377947

## 2022-05-25 MED ORDER — METOPROLOL SUCCINATE ER 50 MG PO TB24: 50.0000 mg | ORAL_TABLET | Freq: Every day | ORAL | 3 refills | Status: DC

## 2022-05-25 NOTE — Patient Instructions (Addendum)
Medication Instructions:  Your physician has recommended you make the following change in your medication:   DISCONTINUE: Diltiazem START: Metoprolol Succinate '50mg'$  daily  *If you need a refill on your cardiac medications before your next appointment, please call your pharmacy*   Lab Work: None If you have labs (blood work) drawn today and your tests are completely normal, you will receive your results only by: [place] (if you have MyChart) OR A paper copy in the mail If you have any lab test that is abnormal or we need to change your treatment, we will call you to review the results.   Testing/Procedures: Your physician has requested that you have an echocardiogram. Echocardiography is a painless test that uses sound waves to create images of your heart. It provides your doctor with information about the size and shape of your heart and how well your heart's chambers and valves are working. This procedure takes approximately one hour. There are no restrictions for this procedure.   Follow-Up: At [facility], you and your health needs are our priority.  As part of our continuing mission to provide you with exceptional heart care, we have created designated Provider Care Teams.  These Care Teams include your primary Cardiologist (physician) and Advanced Practice Providers (APPs -  Physician Assistants and Nurse Practitioners) who all work together to provide you with the care you need, when you need it.   Your next appointment:   1st available with Dr. Myles Gip on same day as Echo (Echo being done 1st)  The format for your next appointment:   In Person  Provider:   Doralee Albino, MD

## 2022-05-31 DIAGNOSIS — C44329 Squamous cell carcinoma of skin of other parts of face: Secondary | ICD-10-CM | POA: Diagnosis not present

## 2022-05-31 DIAGNOSIS — X32XXXD Exposure to sunlight, subsequent encounter: Secondary | ICD-10-CM | POA: Diagnosis not present

## 2022-05-31 DIAGNOSIS — L570 Actinic keratosis: Secondary | ICD-10-CM | POA: Diagnosis not present

## 2022-06-03 IMAGING — CT NM PET SKULL BASE TO THIGH
1 of 7 series · 1 of 25 positions shown · non-contrast
Comparison: None Available.

CLINICAL DATA: Duodenal carcinoid.  Recent endoscopy.

EXAM:
NUCLEAR MEDICINE PET SKULL BASE TO THIGH
TECHNIQUE: 4.2 mCi Ideth 64 DOTATATE was injected intravenously. Full-ring PET
imaging was performed from the skull base to thigh after the
radiotracer. CT data was obtained and used for attenuation
correction and anatomic localization.

[Series 4: ct sk_thigh 5.0 br38 · axial · 5.0mm · 0.91mm/px · 1 of 250 slices shown]
[im 250/250  brain]
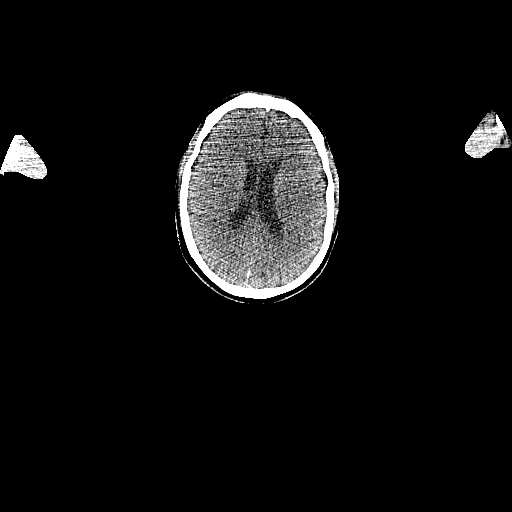

[1 of 25 positions shown; findings below may reference images not displayed]

FINDINGS: NECK

No radiotracer activity in neck lymph nodes.

Incidental CT findings: None

CHEST

No radiotracer accumulation within mediastinal or hilar lymph nodes.
No suspicious pulmonary nodules on the CT scan.

Incidental CT finding:LEFT-sided pacemaker. Coronary artery
calcification and aortic atherosclerotic calcification.

ABDOMEN/PELVIS

Mild physiologic activity noted in the stomach, duodenum and small
bowel. No focal lesion identified.

Focal activity in the uncinate of the pancreas is a known benign
physiologic finding.

No abnormal radiotracer activity in the liver.

No mesenteric implants.  No peritoneal radiotracer avid nodularity.

Physiologic activity noted in the liver, spleen, adrenal glands and
kidneys.

Incidental CT findings:None

SKELETON

No focal activity to suggest skeletal metastasis.

Incidental CT findings:None
IMPRESSION: 1. No evidence neuroendocrine tumor in the GI tract.
2. No evidence of metastatic adenopathy, peritoneal metastasis,
mesenteric metastasis or liver metastasis.
3. No distant metastatic disease.

## 2022-06-03 IMAGING — CT CT ABD-PELV W/ CM
2 of 5 series · 15 of 46 positions shown, 17 images · IV contrast (APPLIED)
Comparison: None available.

CLINICAL DATA: 70-year-old male with history of gastric cancer and
suspected duodenal carcinoid tumor with carcinoid syndrome.
Follow-up study.

* Tracking Code: BO *
EXAM:
CT ABDOMEN AND PELVIS WITH CONTRAST
TECHNIQUE: Multidetector CT imaging of the abdomen and pelvis was performed
using the standard protocol following bolus administration of
intravenous contrast.

[Series 2: axial st · axial · 0.92mm/px · z∈[-336,+104]mm · 12 of 104 slices shown, 14 images]
[im 8/104  soft-tissue]
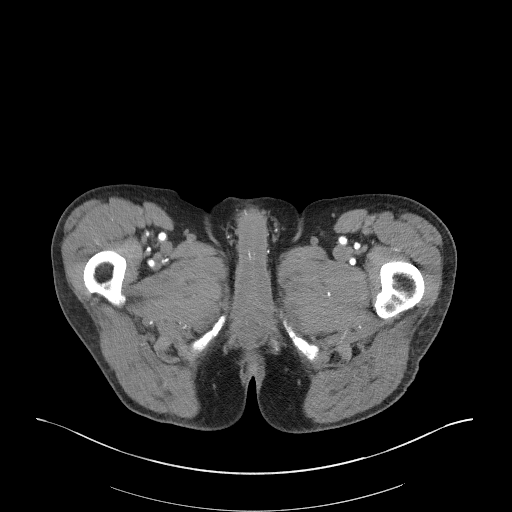
[im 8/104  bone]
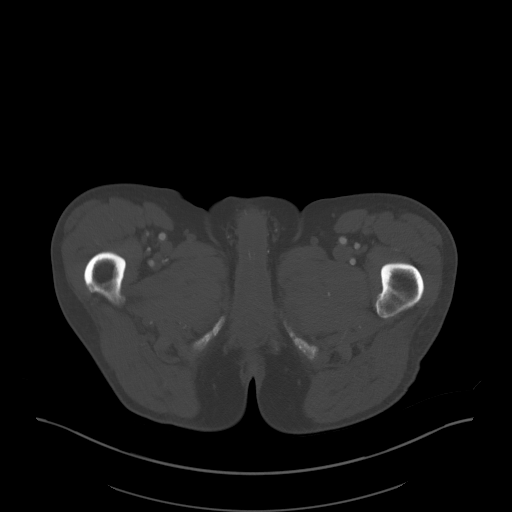
[im 15/104  soft-tissue]
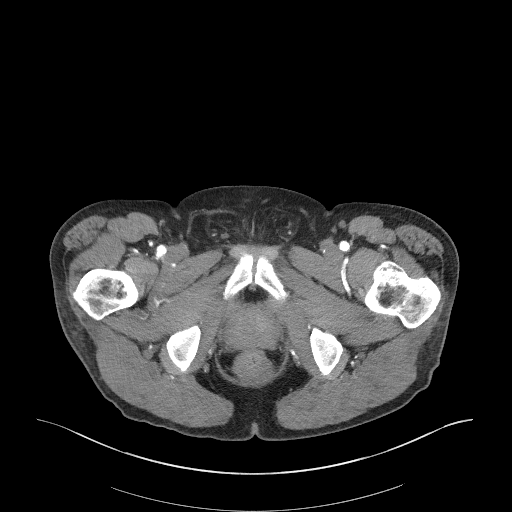
[im 23/104  soft-tissue]
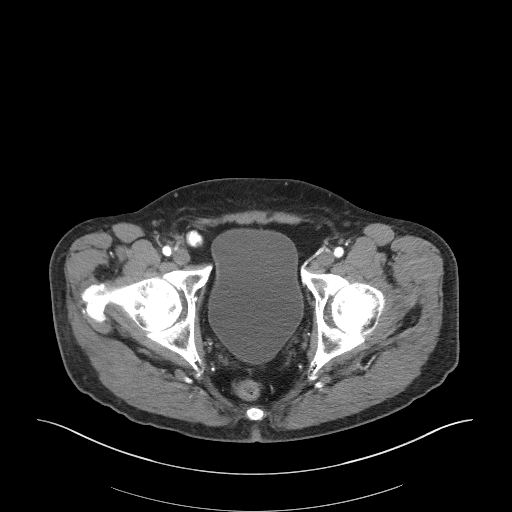
[im 30/104  soft-tissue]
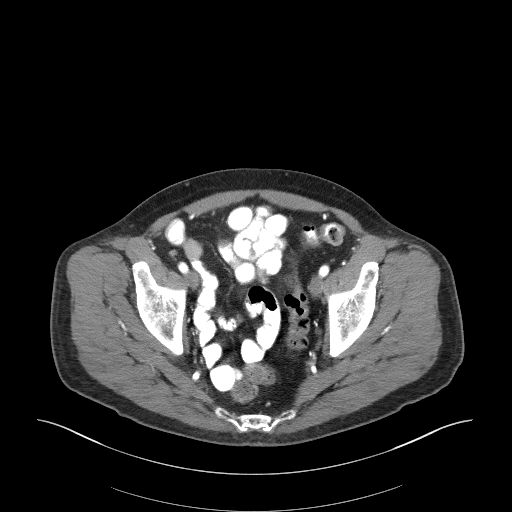
[im 37/104  soft-tissue]
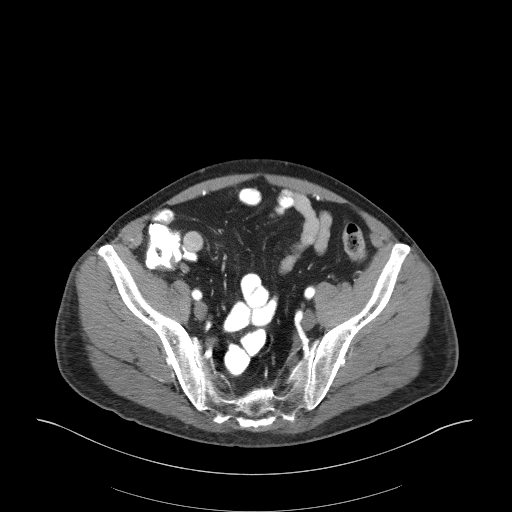
[im 45/104  soft-tissue]
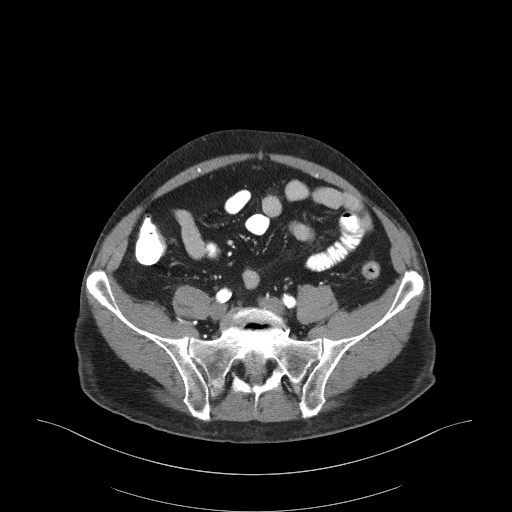
[im 59/104  soft-tissue]
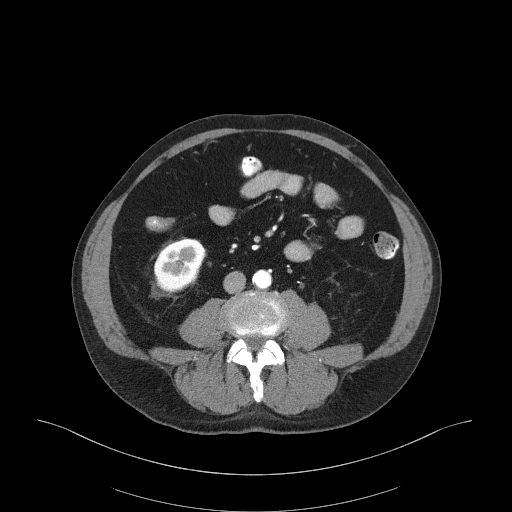
[im 67/104  soft-tissue]
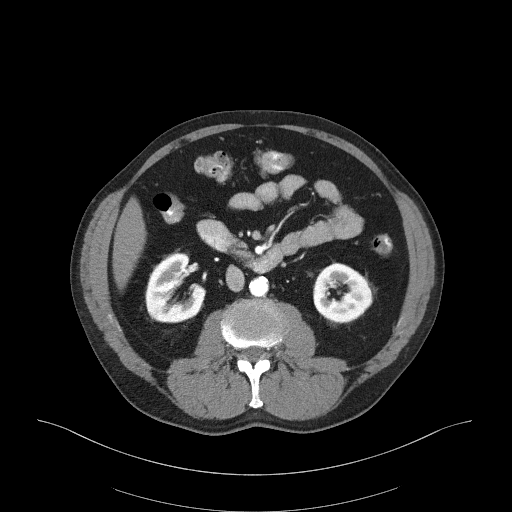
[im 74/104  soft-tissue]
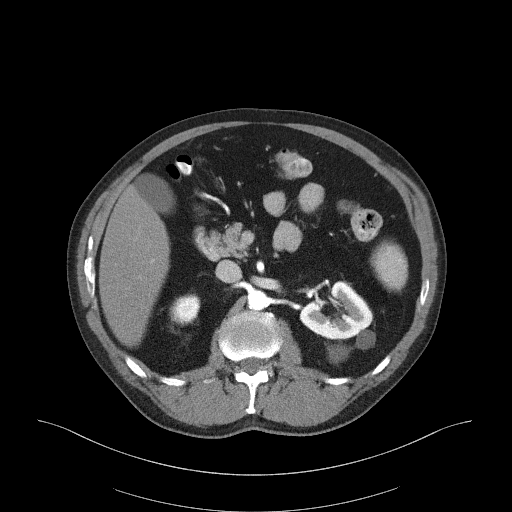
[im 74/104  bone]
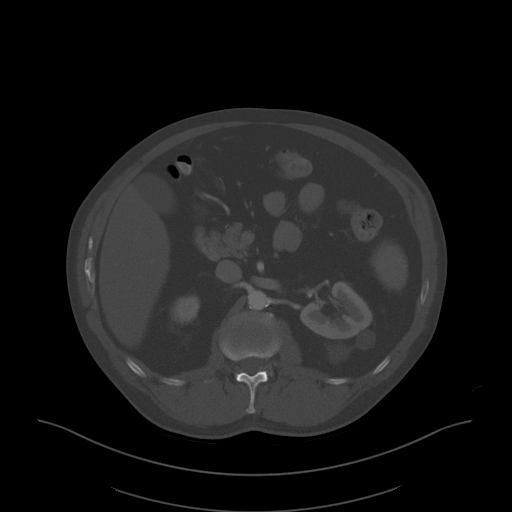
[im 81/104  soft-tissue]
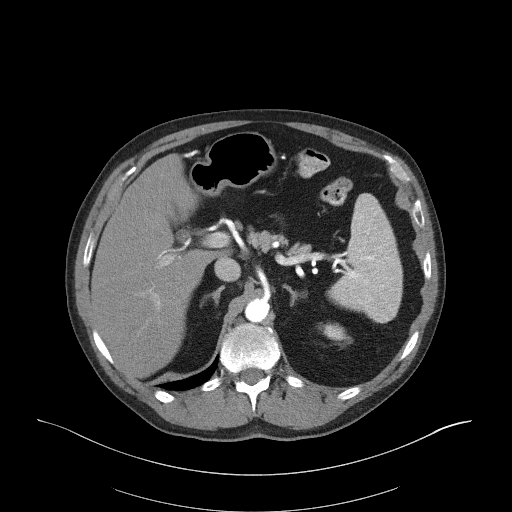
[im 89/104  soft-tissue]
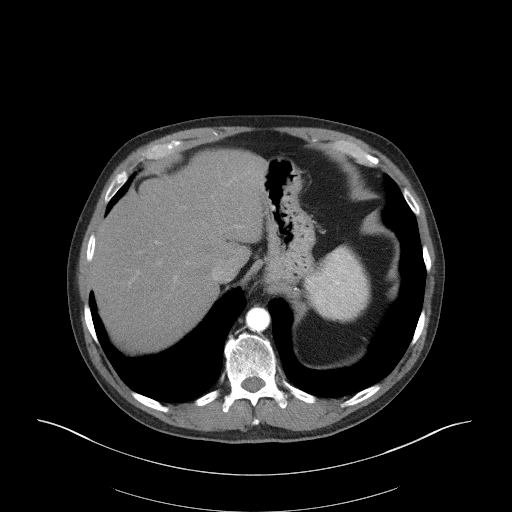
[im 96/104  soft-tissue]
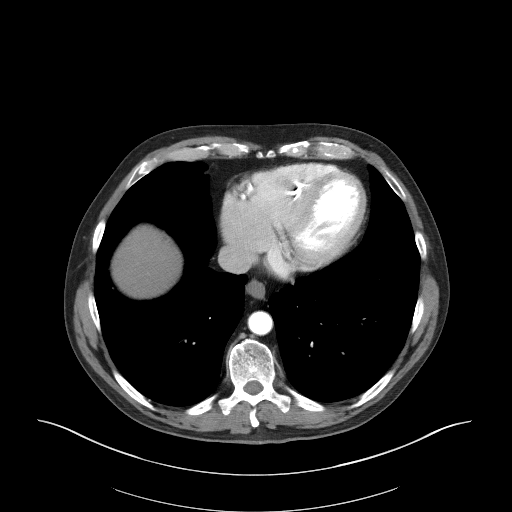

[Series 5: coronal st · coronal · 0.85mm/px · 3 of 117 slices shown]
[im 39/117  soft-tissue]
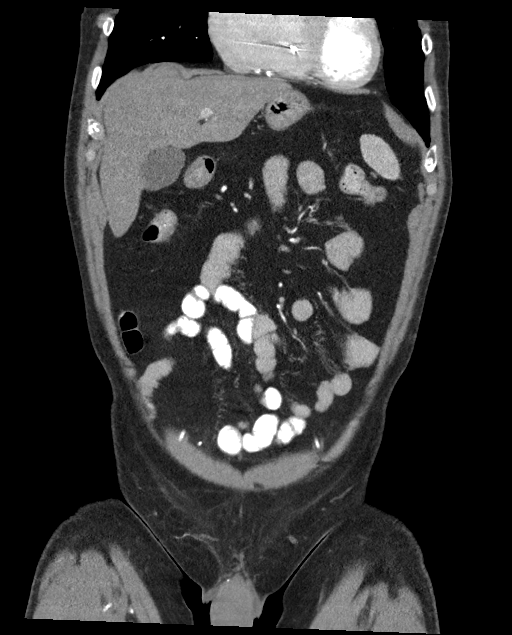
[im 52/117  soft-tissue]
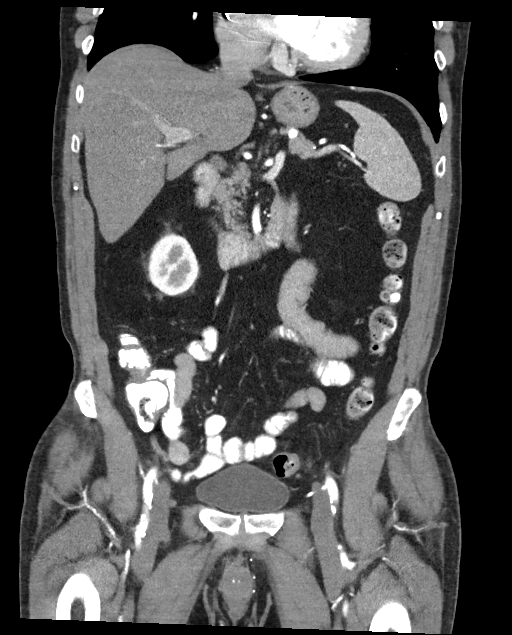
[im 65/117  soft-tissue]
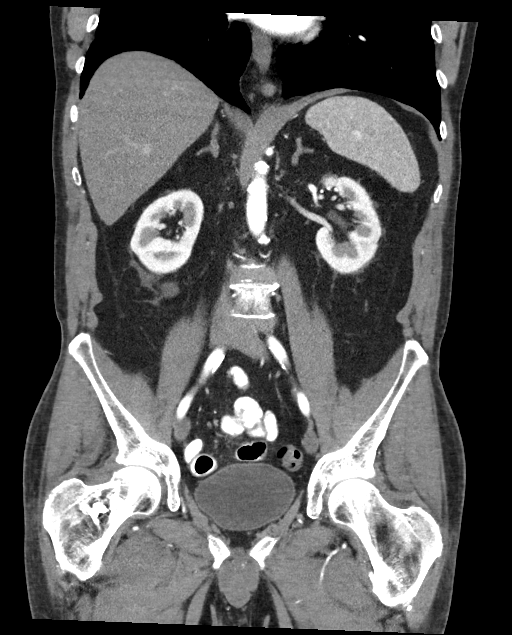

[15 of 46 positions shown; findings below may reference images not displayed]

RADIATION DOSE REDUCTION: This exam was performed according to the
departmental dose-optimization program which includes automated
exposure control, adjustment of the mA and/or kV according to
patient size and/or use of iterative reconstruction technique.

CONTRAST:  100mL OMNIPAQUE IOHEXOL 300 MG/ML  SOLN
FINDINGS: Lower chest: Pacemaker lead terminating in the right ventricular
apex. Atherosclerotic calcifications in the descending thoracic
aorta and right coronary artery.

Hepatobiliary: Diffuse low attenuation throughout the liver,
indicative of a background of hepatic steatosis. Liver has a
slightly shrunken appearance and nodular contour, indicating
underlying cirrhosis. No discrete cystic or solid hepatic lesions.
No intra or extrahepatic biliary ductal dilatation. Gallbladder is
normal in appearance.

Pancreas: No pancreatic mass. No pancreatic ductal dilatation. No
pancreatic or peripancreatic fluid collections or inflammatory
changes.

Spleen: Unremarkable.

Adrenals/Urinary Tract: Multiple well-defined low-attenuation
lesions bilaterally, indicative of simple renal cysts (Bosniak class
1), requiring no imaging follow-up. No suspicious renal lesions. No
hydroureteronephrosis. Bilateral adrenal glands are normal in
appearance. Urinary bladder is normal in appearance.

Stomach/Bowel: The appearance of the stomach is normal. There is no
pathologic dilatation of small bowel or colon. No definite mass
confidently identified in the small bowel or colon on today's
examination. Normal appendix.

Vascular/Lymphatic: Aortic atherosclerosis, without evidence of
aneurysm or dissection in the abdominal or pelvic vasculature. No
lymphadenopathy noted in the abdomen or pelvis.

Reproductive: Prostate gland and seminal vesicles are unremarkable
in appearance.

Other: No significant volume of ascites.  No pneumoperitoneum.

Musculoskeletal: Mixed lucent and sclerotic lesion in the right
femoral neck, similar to prior studies, presumably benign
(previously characterized on CT the right hip 07/05/2016). There are
no aggressive appearing lytic or blastic lesions noted in the
visualized portions of the skeleton.
IMPRESSION: 1. No primary malignancy or definite signs of metastatic disease
noted in the abdomen or pelvis on today's examination.
2. The appearance of the liver suggests a combination of cirrhosis
and hepatic steatosis, as above.
3. Aortic atherosclerosis, in addition to at least right coronary
artery disease. Please note that although the presence of coronary
artery calcium documents the presence of coronary artery disease,
the severity of this disease and any potential stenosis cannot be
assessed on this non-gated CT examination. Assessment for potential
risk factor modification, dietary therapy or pharmacologic therapy
may be warranted, if clinically indicated.
4. Additional incidental findings, as above.

## 2022-06-13 ENCOUNTER — Ambulatory Visit (INDEPENDENT_AMBULATORY_CARE_PROVIDER_SITE_OTHER): Payer: Medicare Other

## 2022-06-13 DIAGNOSIS — I4891 Unspecified atrial fibrillation: Secondary | ICD-10-CM | POA: Diagnosis not present

## 2022-06-13 LAB — CUP PACEART REMOTE DEVICE CHECK
Battery Remaining Longevity: 31 mo
Battery Remaining Percentage: 25 %
Battery Voltage: 2.86 V
Brady Statistic RV Percent Paced: 95 %
Date Time Interrogation Session: 20231002035024
Implantable Lead Implant Date: 20130801
Implantable Lead Implant Date: 20130801
Implantable Lead Location: 753859
Implantable Lead Location: 753860
Implantable Lead Model: 1948
Implantable Pulse Generator Implant Date: 20130801
Lead Channel Impedance Value: 490 Ohm
Lead Channel Pacing Threshold Amplitude: 1 V
Lead Channel Pacing Threshold Pulse Width: 0.4 ms
Lead Channel Sensing Intrinsic Amplitude: 12 mV
Lead Channel Setting Pacing Amplitude: 2.5 V
Lead Channel Setting Pacing Pulse Width: 0.4 ms
Lead Channel Setting Sensing Sensitivity: 2 mV
Pulse Gen Model: 2210
Pulse Gen Serial Number: 7377947

## 2022-06-17 ENCOUNTER — Encounter: Payer: Medicare Other | Admitting: Internal Medicine

## 2022-06-20 ENCOUNTER — Ambulatory Visit: Payer: Medicare Other | Admitting: Cardiovascular Disease

## 2022-06-27 ENCOUNTER — Encounter: Payer: Self-pay | Admitting: Cardiovascular Disease

## 2022-06-27 ENCOUNTER — Ambulatory Visit (HOSPITAL_COMMUNITY): Payer: Medicare Other | Attending: Internal Medicine | Admitting: Cardiovascular Disease

## 2022-06-27 ENCOUNTER — Ambulatory Visit (HOSPITAL_BASED_OUTPATIENT_CLINIC_OR_DEPARTMENT_OTHER): Payer: Medicare Other

## 2022-06-27 VITALS — BP 138/70 | HR 69 | Ht 71.0 in | Wt 198.4 lb

## 2022-06-27 DIAGNOSIS — I4891 Unspecified atrial fibrillation: Secondary | ICD-10-CM

## 2022-06-27 DIAGNOSIS — I420 Dilated cardiomyopathy: Secondary | ICD-10-CM | POA: Insufficient documentation

## 2022-06-27 DIAGNOSIS — I472 Ventricular tachycardia, unspecified: Secondary | ICD-10-CM | POA: Diagnosis not present

## 2022-06-27 DIAGNOSIS — I2510 Atherosclerotic heart disease of native coronary artery without angina pectoris: Secondary | ICD-10-CM

## 2022-06-27 LAB — ECHOCARDIOGRAM COMPLETE: S' Lateral: 3.5 cm

## 2022-06-27 NOTE — Progress Notes (Signed)
Electrophysiology Office Note Date: 06/27/2022  ID:  Paul Dunn, DOB May 01, 1951, MRN [id number]  PCP: Neale Burly, MD Primary Cardiologist: Jenkins Rouge, MD Electrophysiologist: Melida Quitter, MD -> Dr. Myles Gip  CC: Pacemaker follow-up  Paul Dunn is a 71 y.o. male seen today for Melida Quitter, MD for EP follow up.    Seen in ED 05/20/2022 with atypical chest pain. Labs were unrermarkable. Phone note 9/8 shows approx 13s/45-50 beats of likely VT/ dual tachycardia.He was seen in EP clinic is [facility] 05/25/2022 with [facility] and reported that he had no events since his ED discharge. His diltiazem was changed to metoprolol, and his VT zone was increased to > 30 seconds.   No further chest discomfort. He denies syncope or undue SOB. No edema  Cath 02/2022 with mostly Moderate + CAD with 1 borderline OM lesion. Medical therapy recommended unless symptoms worsened or changed.   Today, he reports that he has had some fatigue. No chest pain, shortness of breath, ICD shocks.  Device History: St. Jude Dual Chamber PPM implanted 04/2012 for sinus bradycardia  Past Medical History:  Diagnosis Date   Asthma    "when I was a child"   Atrial fibrillation ([place])    long standing persistent   Bulging of intervertebral disc between L4 and L5    GERD (gastroesophageal reflux disease)    Gouty arthritis    H/O hiatal hernia    High cholesterol    History of blood transfusion ~ 1984   History of bronchitis    "used to get it twice/year; last time was ~ 2 yr ago" (04/12/2012)   HTN (hypertension)    Kidney cysts    "never treated it"   Macular degeneration    Peripheral vascular disease ([place])    Pulmonary nodule    Skin cancer    Tachycardia-bradycardia syndrome ([place])    s/p PPM implant by Dr [name] 04/12/12   Type 2 diabetes mellitus ([place])    Past Surgical History:  Procedure Laterality Date   BIOPSY  01/18/2022   Procedure: BIOPSY;  Surgeon: Harvel Quale, MD;  [facility];  Service: Gastroenterology;;   CARDIAC CATHETERIZATION N/A 06/23/2016   Procedure: Left Heart Cath and Coronary Angiography;  Surgeon: Nelva Bush, MD;  Location: [facility];  Service: Cardiovascular;  Laterality: N/A;   CATARACT EXTRACTION, BILATERAL     COLONOSCOPY N/A 05/22/2019   Procedure: COLONOSCOPY;  Surgeon: Rogene Houston, MD;  [facility];  Service: Endoscopy;  Laterality: N/A;  930   ESOPHAGEAL DILATION  01/2012   ESOPHAGOGASTRODUODENOSCOPY (EGD) WITH PROPOFOL N/A 01/01/2021   Procedure: ESOPHAGOGASTRODUODENOSCOPY (EGD) WITH PROPOFOL;  Surgeon: Harvel Quale, MD;  [facility];  Service: Gastroenterology;  Laterality: N/A;  AM   ESOPHAGOGASTRODUODENOSCOPY (EGD) WITH PROPOFOL N/A 01/18/2022   Procedure: ESOPHAGOGASTRODUODENOSCOPY (EGD) WITH PROPOFOL;  Surgeon: Harvel Quale, MD;  [facility];  Service: Gastroenterology;  Laterality: N/A;  [place]  01/01/2021   Procedure: HEMOSTASIS CLIP PLACEMENT;  Surgeon: Harvel Quale, MD;  [facility];  Service: Gastroenterology;;   LACERATION REPAIR  ~ 1984   left forearm; "cut it w/a power saw"   LEFT HEART CATH AND CORONARY ANGIOGRAPHY N/A 03/10/2022   Procedure: LEFT HEART CATH AND CORONARY ANGIOGRAPHY;  Surgeon: Burnell Blanks, MD;  Location: [facility];  Service: Cardiovascular;  Laterality: N/A;  MELANOMA EXCISION  ~ 2011   left posterior shoulder   PACEMAKER INSERTION  04/12/2012   SJM Accent DR RF pacemaker implanted by Dr [name] for tachy/brady syndrome   PERMANENT PACEMAKER INSERTION N/A 04/12/2012   Procedure: PERMANENT PACEMAKER INSERTION;  Surgeon: Thompson Grayer, MD;  Location: [facility];  Service: Cardiovascular;  Laterality: N/A;   POLYPECTOMY  05/22/2019   Procedure: POLYPECTOMY;  Surgeon: Rogene Houston, MD;  [facility];  Service: Endoscopy;;   transverse, cecum,ascending,    POLYPECTOMY  01/01/2021   Procedure: POLYPECTOMY;  Surgeon: Harvel Quale, MD;  [facility];  Service: Gastroenterology;;  duodenal gastric    Current Outpatient Medications  Medication Sig Dispense Refill   allopurinol (ZYLOPRIM) 300 MG tablet Take 300 mg by mouth in the morning.     Calcium Carb-Cholecalciferol 600-500 MG-UNIT CAPS Take 1 tablet by mouth in the morning.     Carboxymeth-Glyc-Polysorb PF (REFRESH OPTIVE MEGA-3) 0.5-1-0.5 % SOLN Place 1 drop into both eyes in the morning.     Cholecalciferol (VITAMIN D) 125 MCG (5000 UT) CAPS Take 5,000 Units by mouth every evening.     clonazePAM (KLONOPIN) 1 MG tablet Take 1 mg by mouth at bedtime.     Cyanocobalamin (VITAMIN B 12 PO) Take 1 tablet by mouth daily. Once daily     famotidine (PEPCID) 40 MG tablet Take 1 tablet (40 mg total) by mouth at bedtime. 90 tablet 3   glipiZIDE (GLUCOTROL XL) 2.5 MG 24 hr tablet Take 2.5 mg by mouth in the morning.     metFORMIN (GLUCOPHAGE) 1000 MG tablet Take 1,000 mg by mouth 2 (two) times daily with a meal.     metoprolol succinate (TOPROL-XL) 50 MG 24 hr tablet Take 1 tablet (50 mg total) by mouth daily. Take with or immediately following a meal. 90 tablet 3   Multiple Vitamins-Minerals (PRESERVISION AREDS 2) CAPS Take 1 capsule by mouth in the morning and at bedtime.     olmesartan-hydrochlorothiazide (BENICAR HCT) 20-12.5 MG tablet Take 1 tablet by mouth in the morning.     Omega-3 Fatty Acids (FISH OIL) 1000 MG CAPS Take 1,000 mg by mouth in the morning and at bedtime.     omeprazole (PRILOSEC) 40 MG capsule Take one po daily. 90 capsule 3   rosuvastatin (CRESTOR) 5 MG tablet TAKE 1 TABLET EVERY MONDAY, WEDNESDAY, &FRIDAY (Patient taking differently: Take 5 mg by mouth every Monday, Wednesday, and Friday at 8 PM.) 38 tablet 2   warfarin (COUMADIN) 5 MG tablet Take 5-7.5 mg by mouth daily. Take 1 tablet (5 mg) by mouth on Sundays, Tuesdays,  Wednesday and Friday  in the evening. Take 1.5 tablets (7.5 mg) by mouth on Monday, Thursday & Saturday in the evening.     Zinc 50 MG TABS Take 50 mg by mouth in the morning.     No current facility-administered medications for this visit.    Allergies:   Shellfish allergy and Sudafed [pseudoephedrine]   Social History: Social History   Socioeconomic History   Marital status: Married    Spouse name: Not on file   Number of children: Not on file   Years of education: Not on file   Highest education level: Not on file  Occupational History   Not on file  Tobacco Use   Smoking status: Former    Packs/day: 1.00    Years: 2.00    Total pack years: 2.00    Types: Cigarettes  Start date: 09/12/1989    Quit date: 09/13/1991    Years since quitting: 30.8   Smokeless tobacco: Former    Types: Chew    Quit date: 09/12/1996  Vaping Use   Vaping Use: Never used  Substance and Sexual Activity   Alcohol use: No    Alcohol/week: 0.0 standard drinks of alcohol   Drug use: No   Sexual activity: Yes  Other Topics Concern   Not on file  Social History Narrative   Lives with wife in [place].  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[employer]   Social Determinants of Health   Financial Resource Strain: Not on file  Food Insecurity: Not on file  Transportation Needs: Not on file  Physical Activity: Not on file  Stress: Not on file  Social Connections: Not on file  Intimate Partner Violence: Not on file    Family History: Family History  Problem Relation Age of Onset   Coronary artery disease Mother    Coronary artery disease Father    Ulcers Father    Lung cancer Father    Diabetes Sister    Hypertension Sister    Rheum arthritis Sister    Hypertension Brother    Coronary artery disease Maternal Grandmother    Stroke Maternal Grandfather    Coronary artery disease Paternal Grandmother    Coronary artery disease Paternal Grandfather      Review of Systems: All  other systems reviewed and are otherwise negative except as noted above.  Physical Exam: There were no vitals filed for this visit.    GEN- The patient is well appearing, alert and oriented x 3 today.   HEENT: normocephalic, atraumatic; sclera clear, conjunctiva pink; hearing intact; oropharynx clear; neck supple, no JVP Lymph- no cervical lymphadenopathy Lungs- Clear to ausculation bilaterally, normal work of breathing.  No wheezes, rales, rhonchi Heart- Regular  rate and rhythm (V paced), no murmurs, rubs or gallops, PMI not laterally displaced GI- soft, non-tender, non-distended, bowel sounds present, no hepatosplenomegaly Extremities- no clubbing or cyanosis. No peripheral edema; DP/PT/radial pulses 2+ bilaterally MS- no significant deformity or atrophy Skin- warm and dry, no rash or lesion; PPM pocket well healed Psych- euthymic mood, full affect Neuro- strength and sensation are intact  PPM Interrogation-  reviewed in detail today,  See PACEART report.  EKG:  EKG is not ordered today. Personal review of ekg ordered  9/8  shows V pacing at 60 bpm   Recent Labs: 03/14/2022: ALT 23; ALT 23 05/20/2022: BUN 23; Creatinine, Ser 1.04; Hemoglobin 14.1; Platelets 171; Potassium 4.2; Sodium 140   Wt Readings from Last 3 Encounters:  05/25/22 193 lb 12.8 oz (87.9 kg)  05/20/22 195 lb (88.5 kg)  03/14/22 194 lb 9.6 oz (88.3 kg)     Other studies Reviewed: Additional studies/ records that were reviewed today include: Previous EP office notes, Previous remote checks, Most recent labwork.   LHC 02/2022   RPAV lesion is 70% stenosed.   3rd RPL lesion is 70% stenosed.   Prox Cx lesion is 20% stenosed.   Dist RCA lesion is 10% stenosed.   Ost 2nd Mrg lesion is 40% stenosed.   Ost 3rd Mrg lesion is 30% stenosed.   Prox RCA to Mid RCA lesion is 20% stenosed.   RPDA lesion is 40% stenosed.   Mid Cx lesion is 50% stenosed.   2nd Mrg lesion is 65% stenosed.   Mid LAD to Dist LAD lesion is  40% stenosed.  Prox LAD lesion is 50% stenosed.   The left ventricular systolic function is normal.   LV end diastolic pressure is normal.   The left ventricular ejection fraction is 50-55% by visual estimate.   There is no mitral valve regurgitation.   The LAD has a calcified moderate proximal stenosis and diffuse moderate disease in the mid vessel. No change from last cath The Circumflex has diffuse moderate mid stenosis. Small to moderate caliber obtuse marginal branch with moderate to severe proximal stenosis.  The RCA is a large dominant, heavily calcified vessel. The right posterolateral artery has diffuse moderate stenosis, unchanged from last cath.  LV systolic function appears to be low normal.  Normal LVEDP  TTE 06/27/2022 Septal and apical akinesis. Left ventricular ejection fraction, by estimation, is 40 to 45%. The left ventricle has mildly decreased function. The left ventricle demonstrates regional wall motion abnormalities (see scoring diagram/findings for description).  Left atrium is severely dilated.  Assessment and Plan:  1. Symptomatic bradycardia s/p St. Jude PPM  Normal PPM function See Pace Art report No changes today  2. Cardiomyopathy: EF 40-45% he is approximately 100% V-paced, so he will need CRT pacing. He has recently had a coronary angiogram. I discussed the indication for the CRT upgrade procedure and the logistics, risks, potential benefit, and after care. I specifically explained that risks include but are not limited to infection, bleeding,damage to blood vessels, lung, and the heart -- but risk of prolonged hospitalization, need for surgery, or the event of stroke, heart attack, or death are low but not zero.    3. 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  Consider mexitil vs amio if recurs   6. HTN Stable on current regimen    7.  CAD Denies s/s ischemia Will see if Dr. Angelena Form recommends closer look a ostial lesion; though wouldn't expect unless ischemic symptoms or rhythm recurs.  Myoview 02/2022 with EF ~45% and prior infarct, no ischemia.  Cath 03/10/2022 with moderate diffuse disease as above with borderline OM lesion.   7. HL Continue crestor '5mg'$  daily  Current medicines are reviewed at length with the patient today.    Labs from ED visit reviewed and stable.    Disposition:   Follow up with Dr. Myles Gip  in 4 weeks. Alarm symptoms given.     Signed, Melida Quitter, MD  06/27/2022 1:24 PM  [facility] [place] [address] [phone] (office) [phone] (fax)

## 2022-06-27 NOTE — Addendum Note (Signed)
Addended by: Bernestine Amass on: 06/27/2022 04:36 PM   Modules accepted: Orders

## 2022-06-27 NOTE — Patient Instructions (Addendum)
Medication Instructions:  Your physician recommends that you continue on your current medications as directed. Please refer to the Current Medication list given to you today.  *If you need a refill on your cardiac medications before your next appointment, please call your pharmacy*  Lab Work: TODAY: BMET, CBC, PT/INR If you have labs (blood work) drawn today and your tests are completely normal, you will receive your results only by: [place] (if you have MyChart) OR A paper copy in the mail If you have any lab test that is abnormal or we need to change your treatment, we will call you to review the results.  Testing/Procedures: Scheduled for pacemaker upgrade to biventricular device.   Follow-Up: At [facility], you and your health needs are our priority.  As part of our continuing mission to provide you with exceptional heart care, we have created designated Provider Care Teams.  These Care Teams include your primary Cardiologist (physician) and Advanced Practice Providers (APPs -  Physician Assistants and Nurse Practitioners) who all work together to provide you with the care you need, when you need it.  Your next appointment:   See instruction letter

## 2022-06-27 NOTE — Progress Notes (Signed)
Remote pacemaker transmission.   

## 2022-06-28 DIAGNOSIS — I4891 Unspecified atrial fibrillation: Secondary | ICD-10-CM | POA: Diagnosis not present

## 2022-06-28 LAB — CBC WITH DIFFERENTIAL/PLATELET
Basophils Absolute: 0 10*3/uL (ref 0.0–0.2)
Basos: 1 %
EOS (ABSOLUTE): 0.2 10*3/uL (ref 0.0–0.4)
Eos: 2 %
Hematocrit: 39.8 % (ref 37.5–51.0)
Hemoglobin: 14 g/dL (ref 13.0–17.7)
Immature Grans (Abs): 0 10*3/uL (ref 0.0–0.1)
Immature Granulocytes: 0 %
Lymphocytes Absolute: 3.1 10*3/uL (ref 0.7–3.1)
Lymphs: 39 %
MCH: 31.3 pg (ref 26.6–33.0)
MCHC: 35.2 g/dL (ref 31.5–35.7)
MCV: 89 fL (ref 79–97)
Monocytes Absolute: 0.7 10*3/uL (ref 0.1–0.9)
Monocytes: 8 %
Neutrophils Absolute: 3.9 10*3/uL (ref 1.4–7.0)
Neutrophils: 50 %
Platelets: 165 10*3/uL (ref 150–450)
RBC: 4.47 x10E6/uL (ref 4.14–5.80)
RDW: 14 % (ref 11.6–15.4)
WBC: 7.8 10*3/uL (ref 3.4–10.8)

## 2022-06-28 LAB — BASIC METABOLIC PANEL
BUN/Creatinine Ratio: 13 (ref 10–24)
BUN: 16 mg/dL (ref 8–27)
CO2: 23 mmol/L (ref 20–29)
Calcium: 9.5 mg/dL (ref 8.6–10.2)
Chloride: 102 mmol/L (ref 96–106)
Creatinine, Ser: 1.21 mg/dL (ref 0.76–1.27)
Glucose: 116 mg/dL — ABNORMAL HIGH (ref 70–99)
Potassium: 4.4 mmol/L (ref 3.5–5.2)
Sodium: 142 mmol/L (ref 134–144)
eGFR: 64 mL/min/{1.73_m2} (ref >59)

## 2022-06-28 LAB — PROTIME-INR
INR: 2.3 — ABNORMAL HIGH (ref 0.9–1.2)
Prothrombin Time: 23.8 s — ABNORMAL HIGH (ref 9.1–12.0)

## 2022-06-28 NOTE — Addendum Note (Signed)
Addended by: Michelle Nasuti on: 06/28/2022 12:11 PM   Modules accepted: Orders

## 2022-06-29 ENCOUNTER — Telehealth: Payer: Self-pay | Admitting: Internal Medicine

## 2022-06-30 ENCOUNTER — Encounter: Payer: Medicare Other | Admitting: Cardiovascular Disease

## 2022-06-30 ENCOUNTER — Other Ambulatory Visit (HOSPITAL_COMMUNITY): Payer: Medicare Other

## 2022-06-30 DIAGNOSIS — H5203 Hypermetropia, bilateral: Secondary | ICD-10-CM | POA: Diagnosis not present

## 2022-06-30 DIAGNOSIS — H52223 Regular astigmatism, bilateral: Secondary | ICD-10-CM | POA: Diagnosis not present

## 2022-06-30 DIAGNOSIS — H524 Presbyopia: Secondary | ICD-10-CM | POA: Diagnosis not present

## 2022-06-30 DIAGNOSIS — E119 Type 2 diabetes mellitus without complications: Secondary | ICD-10-CM | POA: Diagnosis not present

## 2022-06-30 DIAGNOSIS — H353110 Nonexudative age-related macular degeneration, right eye, stage unspecified: Secondary | ICD-10-CM | POA: Diagnosis not present

## 2022-06-30 DIAGNOSIS — Z961 Presence of intraocular lens: Secondary | ICD-10-CM | POA: Diagnosis not present

## 2022-07-04 DIAGNOSIS — Z85828 Personal history of other malignant neoplasm of skin: Secondary | ICD-10-CM | POA: Diagnosis not present

## 2022-07-04 DIAGNOSIS — Z08 Encounter for follow-up examination after completed treatment for malignant neoplasm: Secondary | ICD-10-CM | POA: Diagnosis not present

## 2022-07-07 NOTE — Pre-Procedure Instructions (Signed)
Attempted to call patient regarding procedure instructions.  Left voicemail on the following items: Arrival time 1130 Nothing to eat or drink after midnight No meds AM of procedure Responsible person to drive you home and stay with you for 24 hrs Wash with special soap night before and morning of procedure If on anti-coagulant drug instructions Coumadin- should have been stopped yesterday.

## 2022-07-08 ENCOUNTER — Ambulatory Visit (HOSPITAL_COMMUNITY)
Admission: RE | Admit: 2022-07-08 | Discharge: 2022-07-08 | Disposition: A | Discharge status: Home / Self Care | Payer: Medicare Other | Attending: Cardiovascular Disease | Admitting: Cardiovascular Disease

## 2022-07-08 ENCOUNTER — Ambulatory Visit (HOSPITAL_COMMUNITY): Payer: Medicare Other

## 2022-07-08 ENCOUNTER — Encounter (HOSPITAL_COMMUNITY)
Admission: RE | Disposition: A | Discharge status: Home / Self Care | Payer: Self-pay | Source: Home / Self Care | Attending: Cardiovascular Disease

## 2022-07-08 DIAGNOSIS — Z87891 Personal history of nicotine dependence: Secondary | ICD-10-CM | POA: Insufficient documentation

## 2022-07-08 DIAGNOSIS — R000 Tachycardia, unspecified: Secondary | ICD-10-CM | POA: Diagnosis not present

## 2022-07-08 DIAGNOSIS — I495 Sick sinus syndrome: Secondary | ICD-10-CM | POA: Diagnosis not present

## 2022-07-08 DIAGNOSIS — I4821 Permanent atrial fibrillation: Secondary | ICD-10-CM | POA: Diagnosis not present

## 2022-07-08 DIAGNOSIS — I2510 Atherosclerotic heart disease of native coronary artery without angina pectoris: Secondary | ICD-10-CM | POA: Diagnosis not present

## 2022-07-08 DIAGNOSIS — I5022 Chronic systolic (congestive) heart failure: Secondary | ICD-10-CM | POA: Diagnosis not present

## 2022-07-08 DIAGNOSIS — Z95810 Presence of automatic (implantable) cardiac defibrillator: Secondary | ICD-10-CM | POA: Diagnosis not present

## 2022-07-08 DIAGNOSIS — E785 Hyperlipidemia, unspecified: Secondary | ICD-10-CM | POA: Insufficient documentation

## 2022-07-08 DIAGNOSIS — Z7901 Long term (current) use of anticoagulants: Secondary | ICD-10-CM | POA: Insufficient documentation

## 2022-07-08 DIAGNOSIS — Z45010 Encounter for checking and testing of cardiac pacemaker pulse generator [battery]: Secondary | ICD-10-CM | POA: Diagnosis not present

## 2022-07-08 DIAGNOSIS — I428 Other cardiomyopathies: Secondary | ICD-10-CM | POA: Insufficient documentation

## 2022-07-08 DIAGNOSIS — I472 Ventricular tachycardia, unspecified: Secondary | ICD-10-CM | POA: Diagnosis not present

## 2022-07-08 DIAGNOSIS — I110 Hypertensive heart disease with heart failure: Secondary | ICD-10-CM | POA: Diagnosis not present

## 2022-07-08 DIAGNOSIS — Z79899 Other long term (current) drug therapy: Secondary | ICD-10-CM | POA: Diagnosis not present

## 2022-07-08 DIAGNOSIS — I5020 Unspecified systolic (congestive) heart failure: Secondary | ICD-10-CM | POA: Diagnosis not present

## 2022-07-08 HISTORY — PX: BIV UPGRADE: EP1202

## 2022-07-08 LAB — PROTIME-INR
INR: 2.1 — ABNORMAL HIGH (ref 0.8–1.2)
Prothrombin Time: 23.7 seconds — ABNORMAL HIGH (ref 11.4–15.2)

## 2022-07-08 LAB — GLUCOSE, CAPILLARY: Glucose-Capillary: 104 mg/dL — ABNORMAL HIGH (ref 70–99)

## 2022-07-08 SURGERY — BIV UPGRADE
Anesthesia: LOCAL

## 2022-07-08 MED ORDER — SODIUM CHLORIDE 0.9 % IV SOLN
80.0000 mg | INTRAVENOUS | Status: AC
  Administered 2022-07-08: 80 mg

## 2022-07-08 MED ORDER — SODIUM CHLORIDE 0.9 % IV SOLN
INTRAVENOUS | Status: AC
  Filled 2022-07-08: qty 2

## 2022-07-08 MED ORDER — POVIDONE-IODINE 10 % EX SWAB
2.0000 | Freq: Once | CUTANEOUS | Status: AC
  Administered 2022-07-08: 2 via TOPICAL

## 2022-07-08 MED ORDER — HEPARIN (PORCINE) IN NACL 1000-0.9 UT/500ML-% IV SOLN
INTRAVENOUS | Status: DC | PRN
  Administered 2022-07-08: 500 mL

## 2022-07-08 MED ORDER — ACETAMINOPHEN 325 MG PO TABS: 325.0000 mg | ORAL_TABLET | ORAL | Status: DC | PRN

## 2022-07-08 MED ORDER — SODIUM CHLORIDE 0.9 % IV SOLN: INTRAVENOUS | Status: DC

## 2022-07-08 MED ORDER — ONDANSETRON HCL 4 MG/2ML IJ SOLN: 4.0000 mg | Freq: Four times a day (QID) | INTRAMUSCULAR | Status: DC | PRN

## 2022-07-08 MED ORDER — MIDAZOLAM HCL 5 MG/5ML IJ SOLN
INTRAMUSCULAR | Status: AC
  Filled 2022-07-08: qty 5

## 2022-07-08 MED ORDER — MIDAZOLAM HCL 5 MG/5ML IJ SOLN
INTRAMUSCULAR | Status: DC | PRN
  Administered 2022-07-08: 1 mg via INTRAVENOUS

## 2022-07-08 MED ORDER — LIDOCAINE HCL (PF) 1 % IJ SOLN
INTRAMUSCULAR | Status: AC
  Filled 2022-07-08: qty 60

## 2022-07-08 MED ORDER — FENTANYL CITRATE (PF) 100 MCG/2ML IJ SOLN
INTRAMUSCULAR | Status: DC | PRN
  Administered 2022-07-08: 50 ug via INTRAVENOUS

## 2022-07-08 MED ORDER — HEPARIN (PORCINE) IN NACL 1000-0.9 UT/500ML-% IV SOLN
INTRAVENOUS | Status: AC
  Filled 2022-07-08: qty 500

## 2022-07-08 MED ORDER — IOHEXOL 350 MG/ML SOLN
INTRAVENOUS | Status: DC | PRN
  Administered 2022-07-08: 20 mL
  Administered 2022-07-08: 15 mL

## 2022-07-08 MED ORDER — FENTANYL CITRATE (PF) 100 MCG/2ML IJ SOLN
INTRAMUSCULAR | Status: AC
  Filled 2022-07-08: qty 2

## 2022-07-08 MED ORDER — CEFAZOLIN SODIUM-DEXTROSE 2-4 GM/100ML-% IV SOLN
INTRAVENOUS | Status: AC
  Filled 2022-07-08: qty 100

## 2022-07-08 MED ORDER — CEFAZOLIN SODIUM-DEXTROSE 2-4 GM/100ML-% IV SOLN
2.0000 g | INTRAVENOUS | Status: AC
  Administered 2022-07-08: 2 g via INTRAVENOUS

## 2022-07-08 MED ORDER — LIDOCAINE HCL (PF) 1 % IJ SOLN
INTRAMUSCULAR | Status: DC | PRN
  Administered 2022-07-08: 60 mL

## 2022-07-08 SURGICAL SUPPLY — 18 items
BALLN COR SINUS VENO 6FR 80 (BALLOONS) ×1
BALLOON COR SINUS VENO 6FR 80 (BALLOONS) IMPLANT
CABLE SURGICAL S-101-97-12 (CABLE) ×2 IMPLANT
CATH CPS DIRECT 135 DS2C020 (CATHETERS) IMPLANT
GUIDEWIRE ANGLED .035X150CM (WIRE) IMPLANT
KIT MICROPUNCTURE NIT STIFF (SHEATH) IMPLANT
LEAD QUARTET 1456Q-86 (Lead) IMPLANT
PACEMAKER QUDR ALLR CRT PM3562 (Pacemaker) IMPLANT
PAD DEFIB RADIO PHYSIO CONN (PAD) ×2 IMPLANT
PMKR QUADRA ALLURE CRT PM3562 (Pacemaker) ×1 IMPLANT
POUCH AIGIS-R ANTIBACT PPM (Mesh General) ×1 IMPLANT
POUCH AIGIS-R ANTIBACT PPM MED (Mesh General) IMPLANT
QUARTET 1456Q-86 (Lead) ×1 IMPLANT
SHEATH 9.5FR PRELUDE SNAP 13 (SHEATH) IMPLANT
SLITTER AGILIS HISPRO (INSTRUMENTS) IMPLANT
TRAY PACEMAKER INSERTION (PACKS) ×2 IMPLANT
WIRE ACUITY WHISPER EDS 4648 (WIRE) IMPLANT
WIRE HI TORQ VERSACORE-J 145CM (WIRE) IMPLANT

## 2022-07-08 NOTE — Progress Notes (Signed)
Pt assisted with shirt redress and LUE shoulder immobilizer with pt and wife education

## 2022-07-08 NOTE — Progress Notes (Signed)
Dr Myles Gip notified of post CXR-advised to proceed with d/c

## 2022-07-08 NOTE — Progress Notes (Signed)
INR today on 2.1 (and previous was 2.3) Discussed with pt RN, final recommendation to not interrupt his warfarin and continue his usual dosing schedule without interruption.  Tommye Standard, PA-C

## 2022-07-08 NOTE — Discharge Instructions (Addendum)
After Your Pacemaker   You have a [employer] Pacemaker  ACTIVITY Do not lift your arm above shoulder height for 1 week after your procedure. After 7 days, you may progress as below.  You should remove your sling 24 hours after your procedure, unless otherwise instructed by your provider.     Friday July 15, 2022  Saturday July 16, 2022 Sunday July 17, 2022 Monday July 18, 2022   Do not lift, push, pull, or carry anything over 10 pounds with the affected arm until 6 weeks (Friday August 19, 2022 ) after your procedure.   You may drive AFTER your wound check, unless you have been told otherwise by your provider.   Ask your healthcare provider when you can go back to work   INCISION/Dressing If you are on a blood thinner such as Coumadin, Xarelto, Eliquis, Plavix, or Pradaxa please confirm with your provider when this should be resumed.   If large square, outer bandage is left in place, this can be removed after 24 hours from your procedure. Do not remove steri-strips or glue as below.   Monitor your Pacemaker site for redness, swelling, and drainage. Call the device clinic at [phone] if you experience these symptoms or fever/chills.  If your incision is sealed with Steri-strips or staples, you may shower 7 days after your procedure or when told by your provider. Do not remove the steri-strips or let the shower hit directly on your site. You may wash around your site with soap and water.    If you were discharged in a sling, please do not wear this during the day more than 48 hours after your surgery unless otherwise instructed. This may increase the risk of stiffness and soreness in your shoulder.   Avoid lotions, ointments, or perfumes over your incision until it is well-healed.  You may use a hot tub or a pool AFTER your wound check appointment if the incision is completely closed.  Pacemaker Alerts:  Some alerts are vibratory and others beep. These are NOT  emergencies. Please call our office to let us know. If this occurs at night or on weekends, it can wait until the next business day. Send a remote transmission.  If your device is capable of reading fluid status (for heart failure), you will be offered monthly monitoring to review this with you.   DEVICE MANAGEMENT Remote monitoring is used to monitor your pacemaker from home. This monitoring is scheduled every 91 days by our office. It allows [place] to keep an eye on the functioning of your device to ensure it is working properly. You will routinely see your Electrophysiologist annually (more often if necessary).   You should receive your ID card for your new device in 4-8 weeks. Keep this card with you at all times once received. Consider wearing a medical alert bracelet or necklace.  Your Pacemaker may be MRI compatible. This will be discussed at your next office visit/wound check.  You should avoid contact with strong electric or magnetic fields.   Do not use amateur (ham) radio equipment or electric (arc) welding torches. MP3 player headphones with magnets should not be used. Some devices are safe to use if held at least 12 inches (30 cm) from your Pacemaker. These include power tools, lawn mowers, and speakers. If you are unsure if something is safe to use, ask your health care provider.  When using your cell phone, hold it to the ear that is on the opposite side from the  Pacemaker. Do not leave your cell phone in a pocket over the Pacemaker.  You may safely use electric blankets, heating pads, computers, and microwave ovens.  Call the office right away if: You have chest pain. You feel more short of breath than you have felt before. You feel more light-headed than you have felt before. Your incision starts to open up.  This information is not intended to replace advice given to you by your health care provider. Make sure you discuss any questions you have with your health care provider.

## 2022-07-08 NOTE — H&P (Signed)
Electrophysiology Office Note Date: 07/08/2022  ID:  Paul Dunn, DOB [birth year], MRN [id number]  PCP: Neale Burly, MD Primary Cardiologist: Jenkins Rouge, MD Electrophysiologist: Melida Quitter, MD -> Dr. Haroldine Laws is a 71 y.o. male seen today for Melida Quitter, MD for upgrade to [facility] pacemaker.   Seen in ED 05/20/2022 with atypical chest pain. Labs were unrermarkable. Phone note 9/8 shows approx 13s/45-50 beats of likely VT/ dual tachycardia.He was seen in EP clinic is Barrington Ellison 05/25/2022 with Bar[facility] Ellison and reported that he had no events since his ED discharge. His diltiazem was changed to metoprolol, and his VT zone was increased to > 30 seconds.   No further chest discomfort. He denies syncope or undue SOB. No edema  Cath 02/2022 with mostly Moderate + CAD with 1 borderline OM lesion. Medical therapy recommended unless symptoms worsened or changed.   Today, he notes no significant change in his medical diagnoses, medication, or physical condition though he has noticed that he is a little more fatigued and is more prone to swelling in the ankles.  Device History: St. Jude Dual Chamber PPM implanted 04/2012 for sinus bradycardia  Past Medical History:  Diagnosis Date   Asthma    "when I was a child"   Atrial fibrillation ([place])    long standing persistent   Bulging of intervertebral disc between L4 and L5    GERD (gastroesophageal reflux disease)    Gouty arthritis    H/O hiatal hernia    High cholesterol    History of blood transfusion ~ 1984   History of bronchitis    "used to get it twice/year; last time was ~ 2 yr ago" (04/12/2012)   HTN (hypertension)    Kidney cysts    "never treated it"   Macular degeneration    Peripheral vascular disease ([place])    Pulmonary nodule    Skin cancer    Tachycardia-bradycardia syndrome ([place])    s/p PPM implant by Dr [name] 04/12/12   Type 2 diabetes mellitus ([place])    Past Surgical History:   Procedure Laterality Date   BIOPSY  01/18/2022   Procedure: BIOPSY;  Surgeon: Harvel Quale, MD;  [facility];  Service: Gastroenterology;;   CARDIAC CATHETERIZATION N/A 06/23/2016   Procedure: Left Heart Cath and Coronary Angiography;  Surgeon: Nelva Bush, MD;  Location: [place] CV LAB;  Service: Cardiovascular;  Laterality: N/A;   CATARACT EXTRACTION, BILATERAL     COLONOSCOPY N/A 05/22/2019   Procedure: COLONOSCOPY;  Surgeon: Rogene Houston, MD;  [facility];  Service: Endoscopy;  Laterality: N/A;  930   ESOPHAGEAL DILATION  01/2012   ESOPHAGOGASTRODUODENOSCOPY (EGD) WITH PROPOFOL N/A 01/01/2021   Procedure: ESOPHAGOGASTRODUODENOSCOPY (EGD) WITH PROPOFOL;  Surgeon: Harvel Quale, MD;  [facility];  Service: Gastroenterology;  Laterality: N/A;  AM   ESOPHAGOGASTRODUODENOSCOPY (EGD) WITH PROPOFOL N/A 01/18/2022   Procedure: ESOPHAGOGASTRODUODENOSCOPY (EGD) WITH PROPOFOL;  Surgeon: Harvel Quale, MD;  [facility];  Service: Gastroenterology;  Laterality: N/A;  [place]  01/01/2021   Procedure: HEMOSTASIS CLIP PLACEMENT;  Surgeon: Harvel Quale, MD;  [facility];  Service: Gastroenterology;;   LACERATION REPAIR  ~ 1984   left forearm; "cut it w/a power saw"   LEFT HEART CATH AND CORONARY ANGIOGRAPHY N/A 03/10/2022   Procedure: LEFT HEART CATH AND CORONARY ANGIOGRAPHY;  Surgeon: Burnell Blanks,  MD;  Location: [place] [facility] LAB;  Service: Cardiovascular;  Laterality: N/A;   MELANOMA EXCISION  ~ 2011   left posterior shoulder   PACEMAKER INSERTION  04/12/2012   SJM Accent DR RF pacemaker implanted by Dr [name] for tachy/brady syndrome   PERMANENT PACEMAKER INSERTION N/A 04/12/2012   Procedure: PERMANENT PACEMAKER INSERTION;  Surgeon: Thompson Grayer, MD;  Location: [facility] CATH LAB;  Service: Cardiovascular;  Laterality: N/A;   POLYPECTOMY  05/22/2019   Procedure:  POLYPECTOMY;  Surgeon: Rogene Houston, MD;  [facility];  Service: Endoscopy;;  transverse, cecum,ascending,    POLYPECTOMY  01/01/2021   Procedure: POLYPECTOMY;  Surgeon: Harvel Quale, MD;  [facility];  Service: Gastroenterology;;  duodenal gastric    Current Facility-Administered Medications  Medication Dose Route Frequency Provider Last Rate Last Admin   0.9 %  sodium chloride infusion   Intravenous Continuous Shaelee Forni, Yetta Barre, MD 50 mL/hr at 07/08/22 1224 New Bag at 07/08/22 1224   ceFAZolin (ANCEF) IVPB 2g/100 mL premix  2 g Intravenous On Call Lakela Kuba, Yetta Barre, MD       gentamicin (GARAMYCIN) 80 mg in sodium chloride 0.9 % 500 mL irrigation  80 mg Irrigation On Call [place]oua Schreiner, Yetta Barre, MD        Allergies:   Shellfish allergy and Sudafed [pseudoephedrine]   Social History: Social History   Socioeconomic History   Marital status: Married    Spouse name: Not on file   Number of children: Not on file   Years of education: Not on file   Highest education level: Not on file  Occupational History   Not on file  Tobacco Use   Smoking status: Former    Packs/day: 1.00    Years: 2.00    Total pack years: 2.00    Types: Cigarettes    Start date: 09/12/1989    Quit date: 09/13/1991    Years since quitting: 30.8   Smokeless tobacco: Former    Types: Chew    Quit date: 09/12/1996  Vaping Use   Vaping Use: Never used  Substance and Sexual Activity   Alcohol use: No    Alcohol/week: 0.0 standard drinks of alcohol   Drug use: No   Sexual activity: Yes  Other Topics Concern   Not on file  Social History Narrative   Lives with wife in [place].  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[employer]   Social Determinants of Health   Financial Resource Strain: Not on file  Food Insecurity: Not on file  Transportation Needs: Not on file  Physical Activity: Not on file  Stress: Not on file  Social Connections: Not on file  Intimate  Partner Violence: Not on file    Family History: Family History  Problem Relation Age of Onset   Coronary artery disease Mother    Coronary artery disease Father    Ulcers Father    Lung cancer Father    Diabetes Sister    Hypertension Sister    Rheum arthritis Sister    Hypertension Brother    Coronary artery disease Maternal Grandmother    Stroke Maternal Grandfather    Coronary artery disease Paternal Grandmother    Coronary artery disease Paternal Grandfather      Review of Systems: All other systems reviewed and are otherwise negative except as noted above.  Physical Exam: Vitals:   07/08/22 1157  BP: (!) 158/80  Pulse: 64  Resp: 16  Temp: 98 F (  36.7 C)  TempSrc: Oral  SpO2: 97%  Weight: 89.8 kg  Height: '5\' 11"'$  (1.803 m)      GEN- The patient is well appearing, alert and oriented x 3 today.   HEENT: normocephalic, atraumatic; sclera clear, conjunctiva pink; hearing intact; oropharynx clear; neck supple, no JVP Lymph- no cervical lymphadenopathy Lungs- Clear to ausculation bilaterally, normal work of breathing.  No wheezes, rales, rhonchi Heart- Regular  rate and rhythm (V paced), no murmurs, rubs or gallops, PMI not laterally displaced GI- soft, non-tender, non-distended, bowel sounds present, no hepatosplenomegaly Extremities- no clubbing or cyanosis. No peripheral edema; DP/PT/radial pulses 2+ bilaterally MS- no significant deformity or atrophy Skin- warm and dry, no rash or lesion; PPM pocket well healed Psych- euthymic mood, full affect Neuro- strength and sensation are intact  PPM Interrogation-  reviewed in detail today,  See PACEART report.  EKG:  EKG is not ordered today. Personal review of ekg ordered  9/8  shows V pacing at 60 bpm   Recent Labs: 03/14/2022: ALT 23; ALT 23 06/27/2022: BUN 16; Creatinine, Ser 1.21; Hemoglobin 14.0; Platelets 165; Potassium 4.4; Sodium 142   Wt Readings from Last 3 Encounters:  07/08/22 89.8 kg  06/27/22 90 kg   05/25/22 87.9 kg     Other studies Reviewed: Additional studies/ records that were reviewed today include: Previous EP office notes, Previous remote checks, Most recent labwork.   LHC 02/2022   RPAV lesion is 70% stenosed.   3rd RPL lesion is 70% stenosed.   Prox Cx lesion is 20% stenosed.   Dist RCA lesion is 10% stenosed.   Ost 2nd Mrg lesion is 40% stenosed.   Ost 3rd Mrg lesion is 30% stenosed.   Prox RCA to Mid RCA lesion is 20% stenosed.   RPDA lesion is 40% stenosed.   Mid Cx lesion is 50% stenosed.   2nd Mrg lesion is 65% stenosed.   Mid LAD to Dist LAD lesion is 40% stenosed.   Prox LAD lesion is 50% stenosed.   The left ventricular systolic function is normal.   LV end diastolic pressure is normal.   The left ventricular ejection fraction is 50-55% by visual estimate.   There is no mitral valve regurgitation.   The LAD has a calcified moderate proximal stenosis and diffuse moderate disease in the mid vessel. No change from last cath The Circumflex has diffuse moderate mid stenosis. Small to moderate caliber obtuse marginal branch with moderate to severe proximal stenosis.  The RCA is a large dominant, heavily calcified vessel. The right posterolateral artery has diffuse moderate stenosis, unchanged from last cath.  LV systolic function appears to be low normal.  Normal LVEDP  TTE 06/27/2022 Septal and apical akinesis. Left ventricular ejection fraction, by estimation, is 40 to 45%. The left ventricle has mildly decreased function. The left ventricle demonstrates regional wall motion abnormalities (see scoring diagram/findings for description).  Left atrium is severely dilated.  Assessment and Plan:  1. Symptomatic bradycardia s/p St. Jude PPM  Planning for upgrade to BiV pacemaker today.   2. Cardiomyopathy: EF 40-45% he is approximately 100% V-paced, so he will need CRT pacing. He has recently had a coronary angiogram. We discussed risks and benefits previously,  and he is eager to proceed. I answered all questions today.   3. 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  Consider mexitil vs amio if recurs  6. HTN Stable on current regimen    7. CAD Denies s/s ischemia    7. HL Continue crestor '5mg'$  daily   Signed, Melida Quitter, MD  07/08/2022 1:59 PM

## 2022-07-08 NOTE — Progress Notes (Deleted)
Pt ambulated w/o difficulty-no change to left groin site-d/c NAD with brother

## 2022-07-11 ENCOUNTER — Encounter (HOSPITAL_COMMUNITY): Payer: Self-pay | Admitting: Cardiovascular Disease

## 2022-07-11 ENCOUNTER — Telehealth: Payer: Self-pay

## 2022-07-11 NOTE — Telephone Encounter (Addendum)
-----   Message from [place], [place] sent at 07/08/2022  5:17 PM EDT ----- Same day d/c  AM  [place]ott PPM > CRT-P  Warfarin, held one day and resume as usual  Renee   Given INR of only 2.1  I ultimately advised no interruption in his warfarin dosing.

## 2022-07-11 NOTE — Telephone Encounter (Signed)
Follow-up after same day discharge: Implant date: 07/08/2022 MD: Dr. Myles Gip Device: upgrade to Yetter Location: left chest   Wound check visit: July 20, 2022 90 day MD follow-up: scheduled  Remote Transmission received: no-he will send tonight  Dressing/sling removed: yes  Confirm OAC restart on: did not hold   Pt is doing well, no complaints.

## 2022-07-12 NOTE — Telephone Encounter (Signed)
The patient left a voicemail stating he tried to send the transmission but was unsuccessful. I called to help him but he had company. I told him I will give him a call back around 11 am.

## 2022-07-12 NOTE — Telephone Encounter (Signed)
Transmission was sent successfully, device communicating remotely.

## 2022-07-13 ENCOUNTER — Encounter: Payer: Self-pay | Admitting: Cardiovascular Disease

## 2022-07-14 MED ORDER — CARVEDILOL 12.5 MG PO TABS: 12.5000 mg | ORAL_TABLET | Freq: Two times a day (BID) | ORAL | 3 refills | Status: DC

## 2022-07-19 ENCOUNTER — Encounter: Payer: Medicare Other | Admitting: Cardiovascular Disease

## 2022-07-20 ENCOUNTER — Ambulatory Visit: Payer: Medicare Other | Attending: Cardiology

## 2022-07-20 DIAGNOSIS — I420 Dilated cardiomyopathy: Secondary | ICD-10-CM | POA: Insufficient documentation

## 2022-07-20 DIAGNOSIS — I495 Sick sinus syndrome: Secondary | ICD-10-CM | POA: Insufficient documentation

## 2022-07-20 DIAGNOSIS — Z23 Encounter for immunization: Secondary | ICD-10-CM | POA: Diagnosis not present

## 2022-07-20 LAB — CUP PACEART INCLINIC DEVICE CHECK
Date Time Interrogation Session: 20231108114726
Implantable Lead Connection Status: 753985
Implantable Lead Connection Status: 753985
Implantable Lead Connection Status: 753985
Implantable Lead Implant Date: 20130801
Implantable Lead Implant Date: 20130801
Implantable Lead Implant Date: 20231027
Implantable Lead Location: 753858
Implantable Lead Location: 753859
Implantable Lead Location: 753860
Implantable Lead Model: 1948
Implantable Pulse Generator Implant Date: 20231027
Pulse Gen Model: 3562
Pulse Gen Serial Number: 8114542

## 2022-07-20 NOTE — Patient Instructions (Addendum)

## 2022-07-20 NOTE — Progress Notes (Signed)
Wound check appointment. Steri-strips removed. Wound without redness or edema. Incision edges approximated, wound well healed. Normal device function. Thresholds, sensing, and impedances consistent with implant measurements. Device programmed with auto capture programmed on for extra safety margin until 3 month visit. Histogram distribution appropriate for patient and level of activity. No high ventricular rates noted. Patient educated about wound care, arm mobility, lifting restrictions. ROV 10/27/21 with Dr. Myles Gip

## 2022-07-23 ENCOUNTER — Encounter (INDEPENDENT_AMBULATORY_CARE_PROVIDER_SITE_OTHER): Payer: Self-pay | Admitting: Gastroenterology

## 2022-07-28 DIAGNOSIS — I4891 Unspecified atrial fibrillation: Secondary | ICD-10-CM | POA: Diagnosis not present

## 2022-08-09 DIAGNOSIS — L570 Actinic keratosis: Secondary | ICD-10-CM | POA: Diagnosis not present

## 2022-08-09 DIAGNOSIS — X32XXXD Exposure to sunlight, subsequent encounter: Secondary | ICD-10-CM | POA: Diagnosis not present

## 2022-08-10 DIAGNOSIS — I4891 Unspecified atrial fibrillation: Secondary | ICD-10-CM | POA: Diagnosis not present

## 2022-08-10 DIAGNOSIS — E1142 Type 2 diabetes mellitus with diabetic polyneuropathy: Secondary | ICD-10-CM | POA: Diagnosis not present

## 2022-08-10 DIAGNOSIS — K2100 Gastro-esophageal reflux disease with esophagitis, without bleeding: Secondary | ICD-10-CM | POA: Diagnosis not present

## 2022-08-10 DIAGNOSIS — M1009 Idiopathic gout, multiple sites: Secondary | ICD-10-CM | POA: Diagnosis not present

## 2022-08-10 DIAGNOSIS — I10 Essential (primary) hypertension: Secondary | ICD-10-CM | POA: Diagnosis not present

## 2022-08-10 DIAGNOSIS — E782 Mixed hyperlipidemia: Secondary | ICD-10-CM | POA: Diagnosis not present

## 2022-10-03 ENCOUNTER — Other Ambulatory Visit (INDEPENDENT_AMBULATORY_CARE_PROVIDER_SITE_OTHER): Payer: Self-pay | Admitting: Gastroenterology

## 2022-10-10 ENCOUNTER — Ambulatory Visit: Payer: Medicare Other | Attending: Cardiovascular Disease

## 2022-10-10 DIAGNOSIS — I495 Sick sinus syndrome: Secondary | ICD-10-CM | POA: Diagnosis not present

## 2022-10-11 LAB — CUP PACEART REMOTE DEVICE CHECK
Battery Remaining Longevity: 106 mo
Battery Remaining Percentage: 95.5 %
Battery Voltage: 3.02 V
Date Time Interrogation Session: 20240129020020
Implantable Lead Connection Status: 753985
Implantable Lead Connection Status: 753985
Implantable Lead Connection Status: 753985
Implantable Lead Implant Date: 20130801
Implantable Lead Implant Date: 20130801
Implantable Lead Implant Date: 20231027
Implantable Lead Location: 753858
Implantable Lead Location: 753859
Implantable Lead Location: 753860
Implantable Lead Model: 1948
Implantable Pulse Generator Implant Date: 20231027
Lead Channel Impedance Value: 550 Ohm
Lead Channel Impedance Value: 850 Ohm
Lead Channel Pacing Threshold Amplitude: 1 V
Lead Channel Pacing Threshold Amplitude: 1 V
Lead Channel Pacing Threshold Pulse Width: 0.5 ms
Lead Channel Pacing Threshold Pulse Width: 0.5 ms
Lead Channel Sensing Intrinsic Amplitude: 12 mV
Lead Channel Setting Pacing Amplitude: 2 V
Lead Channel Setting Pacing Amplitude: 2 V
Lead Channel Setting Pacing Pulse Width: 0.5 ms
Lead Channel Setting Pacing Pulse Width: 0.5 ms
Lead Channel Setting Sensing Sensitivity: 2 mV
Pulse Gen Model: 3562
Pulse Gen Serial Number: 8114542

## 2022-10-12 DIAGNOSIS — I4891 Unspecified atrial fibrillation: Secondary | ICD-10-CM | POA: Diagnosis not present

## 2022-10-25 DIAGNOSIS — D225 Melanocytic nevi of trunk: Secondary | ICD-10-CM | POA: Diagnosis not present

## 2022-10-25 DIAGNOSIS — X32XXXD Exposure to sunlight, subsequent encounter: Secondary | ICD-10-CM | POA: Diagnosis not present

## 2022-10-25 DIAGNOSIS — L570 Actinic keratosis: Secondary | ICD-10-CM | POA: Diagnosis not present

## 2022-10-25 DIAGNOSIS — Z1283 Encounter for screening for malignant neoplasm of skin: Secondary | ICD-10-CM | POA: Diagnosis not present

## 2022-10-27 ENCOUNTER — Encounter: Payer: Self-pay | Admitting: Cardiovascular Disease

## 2022-10-27 ENCOUNTER — Ambulatory Visit: Payer: Medicare Other | Attending: Cardiovascular Disease | Admitting: Cardiovascular Disease

## 2022-10-27 VITALS — BP 132/84 | HR 70 | Ht 71.0 in | Wt 194.4 lb

## 2022-10-27 DIAGNOSIS — I420 Dilated cardiomyopathy: Secondary | ICD-10-CM | POA: Diagnosis not present

## 2022-10-27 DIAGNOSIS — I4821 Permanent atrial fibrillation: Secondary | ICD-10-CM | POA: Insufficient documentation

## 2022-10-27 LAB — CUP PACEART INCLINIC DEVICE CHECK
Battery Remaining Longevity: 99 mo
Battery Voltage: 3.01 V
Brady Statistic RA Percent Paced: 0 %
Brady Statistic RV Percent Paced: 99.61 %
Date Time Interrogation Session: 20240215132937
Implantable Lead Connection Status: 753985
Implantable Lead Connection Status: 753985
Implantable Lead Connection Status: 753985
Implantable Lead Implant Date: 20130801
Implantable Lead Implant Date: 20130801
Implantable Lead Implant Date: 20231027
Implantable Lead Location: 753858
Implantable Lead Location: 753859
Implantable Lead Location: 753860
Implantable Lead Model: 1948
Implantable Pulse Generator Implant Date: 20231027
Lead Channel Impedance Value: 550 Ohm
Lead Channel Impedance Value: 862.5 Ohm
Lead Channel Pacing Threshold Amplitude: 0.75 V
Lead Channel Pacing Threshold Amplitude: 0.75 V
Lead Channel Pacing Threshold Amplitude: 1 V
Lead Channel Pacing Threshold Amplitude: 1 V
Lead Channel Pacing Threshold Pulse Width: 0.5 ms
Lead Channel Pacing Threshold Pulse Width: 0.5 ms
Lead Channel Pacing Threshold Pulse Width: 0.5 ms
Lead Channel Pacing Threshold Pulse Width: 0.5 ms
Lead Channel Sensing Intrinsic Amplitude: 9.7 mV
Lead Channel Setting Pacing Amplitude: 2 V
Lead Channel Setting Pacing Amplitude: 2 V
Lead Channel Setting Pacing Pulse Width: 0.5 ms
Lead Channel Setting Pacing Pulse Width: 0.5 ms
Lead Channel Setting Sensing Sensitivity: 2 mV
Pulse Gen Model: 3562
Pulse Gen Serial Number: 8114542

## 2022-10-27 NOTE — Progress Notes (Signed)
Electrophysiology Office Note Date: 10/27/2022  ID:  Paul Dunn, DOB 13-Jan-1951, MRN [id number]  PCP: Neale Burly, MD Primary Cardiologist: Jenkins Rouge, MD Electrophysiologist: Melida Quitter, MD   CC: Pacemaker follow-up  Paul Dunn is a 72 y.o. male seen today for Melida Quitter, MD for EP follow up.    Seen in ED 05/20/2022 with atypical chest pain. Labs were unrermarkable. Phone note 9/8 shows approx 13s/45-50 beats of likely VT/ dual tachycardia.He was seen in EP clinic is [facility] Ellison 05/25/2022 with Barrington Ellison and reported that he had no events since his ED discharge. His diltiazem was changed to metoprolol, and his VT zone was increased to > 30 seconds.   No further chest discomfort. He denies syncope or undue SOB. No edema  Cath 02/2022 with mostly Moderate + CAD with 1 borderline OM lesion. Medical therapy recommended unless symptoms worsened or changed.   10/27/2022 He underwent upgrade to a BiV ICD with placement of a LV lead due to decline in EF to 40% with a a high burden of RV pacing. He reports that he feels better, and his dependent edema as resolved.  Device History: St. Jude Dual Chamber PPM implanted 04/2012 for sinus bradycardia  Past Medical History:  Diagnosis Date   Asthma    "when I was a child"   Atrial fibrillation ([place])    long standing persistent   Bulging of intervertebral disc between L4 and L5    GERD (gastroesophageal reflux disease)    Gouty arthritis    H/O hiatal hernia    High cholesterol    History of blood transfusion ~ 1984   History of bronchitis    "used to get it twice/year; last time was ~ 2 yr ago" (04/12/2012)   HTN (hypertension)    Kidney cysts    "never treated it"   Macular degeneration    Peripheral vascular disease ([place])    Pulmonary nodule    Skin cancer    Tachycardia-bradycardia syndrome ([place])    s/p PPM implant by Dr [name] 04/12/12   Type 2 diabetes mellitus ([place])    Past Surgical  History:  Procedure Laterality Date   BIOPSY  01/18/2022   Procedure: BIOPSY;  Surgeon: Harvel Quale, MD;  [facility];  Service: Gastroenterology;;   Zollie Beckers N/A 07/08/2022   Procedure: BIV PPM  UPGRADE;  Surgeon: Melida Quitter, MD;  Location: [place] CV LAB;  Service: Cardiovascular;  Laterality: N/A;   CARDIAC CATHETERIZATION N/A 06/23/2016   Procedure: Left Heart Cath and Coronary Angiography;  Surgeon: Nelva Bush, MD;  Location: [place] [facility] LAB;  Service: Cardiovascular;  Laterality: N/A;   CATARACT EXTRACTION, BILATERAL     COLONOSCOPY N/A 05/22/2019   Procedure: COLONOSCOPY;  Surgeon: Rogene Houston, MD;  [facility];  Service: Endoscopy;  Laterality: N/A;  930   ESOPHAGEAL DILATION  01/2012   ESOPHAGOGASTRODUODENOSCOPY (EGD) WITH PROPOFOL N/A 01/01/2021   Procedure: ESOPHAGOGASTRODUODENOSCOPY (EGD) WITH PROPOFOL;  Surgeon: Harvel Quale, MD;  [facility];  Service: Gastroenterology;  Laterality: N/A;  AM   ESOPHAGOGASTRODUODENOSCOPY (EGD) WITH PROPOFOL N/A 01/18/2022   Procedure: ESOPHAGOGASTRODUODENOSCOPY (EGD) WITH PROPOFOL;  Surgeon: Harvel Quale, MD;  [facility];  Service: Gastroenterology;  Laterality: N/A;  [place]  01/01/2021   Procedure: HEMOSTASIS CLIP PLACEMENT;  Surgeon: Harvel Quale, MD;  [facility];  Service: Gastroenterology;;  LACERATION REPAIR  ~ 1984   left forearm; "cut it w/a power saw"   LEFT HEART CATH AND CORONARY ANGIOGRAPHY N/A 03/10/2022   Procedure: LEFT HEART CATH AND CORONARY ANGIOGRAPHY;  Surgeon: Burnell Blanks, MD;  Location: [place] [facility] LAB;  Service: Cardiovascular;  Laterality: N/A;   MELANOMA EXCISION  ~ 2011   left posterior shoulder   PACEMAKER INSERTION  04/12/2012   SJM Accent DR RF pacemaker implanted by Dr [name] for tachy/brady syndrome   PERMANENT PACEMAKER INSERTION N/A 04/12/2012    Procedure: PERMANENT PACEMAKER INSERTION;  Surgeon: Thompson Grayer, MD;  Location: [facility];  Service: Cardiovascular;  Laterality: N/A;   POLYPECTOMY  05/22/2019   Procedure: POLYPECTOMY;  Surgeon: Rogene Houston, MD;  [facility];  Service: Endoscopy;;  transverse, cecum,ascending,    POLYPECTOMY  01/01/2021   Procedure: POLYPECTOMY;  Surgeon: Harvel Quale, MD;  [facility];  Service: Gastroenterology;;  duodenal gastric    Current Outpatient Medications  Medication Sig Dispense Refill   allopurinol (ZYLOPRIM) 300 MG tablet Take 300 mg by mouth in the morning.     Calcium Carb-Cholecalciferol 600-500 MG-UNIT CAPS Take 1 tablet by mouth in the morning.     Carboxymeth-Glyc-Polysorb PF (REFRESH OPTIVE MEGA-3) 0.5-1-0.5 % SOLN Place 1 drop into both eyes in the morning.     carvedilol (COREG) 12.5 MG tablet Take 1 tablet (12.5 mg total) by mouth 2 (two) times daily. 180 tablet 3   Cholecalciferol (VITAMIN D) 125 MCG (5000 UT) CAPS Take 5,000 Units by mouth at bedtime.     clonazePAM (KLONOPIN) 1 MG tablet Take 1 mg by mouth at bedtime.     Cyanocobalamin (VITAMIN B 12 PO) Take 5,000 mcg by mouth daily with supper.     famotidine (PEPCID) 40 MG tablet Take 1 tablet (40 mg total) by mouth at bedtime. 90 tablet 3   glipiZIDE (GLUCOTROL XL) 2.5 MG 24 hr tablet Take 2.5 mg by mouth in the morning.     metFORMIN (GLUCOPHAGE) 1000 MG tablet Take 1,000 mg by mouth 2 (two) times daily with a meal.     Multiple Vitamins-Minerals (PRESERVISION AREDS 2) CAPS Take 1 capsule by mouth in the morning and at bedtime.     olmesartan-hydrochlorothiazide (BENICAR HCT) 20-12.5 MG tablet Take 1 tablet by mouth in the morning.     Omega-3 Fatty Acids (FISH OIL) 1000 MG CAPS Take 1,000 mg by mouth in the morning and at bedtime.     omeprazole (PRILOSEC) 40 MG capsule Take one po daily. 90 capsule 3   rosuvastatin (CRESTOR) 5 MG tablet TAKE 1 TABLET EVERY MONDAY, WEDNESDAY, &FRIDAY  (Patient taking differently: Take 5 mg by mouth every Monday, Wednesday, and Friday at 8 PM.) 38 tablet 2   warfarin (COUMADIN) 5 MG tablet Take 5-7.5 mg by mouth See admin instructions. Take 1 tablet (5 mg) by mouth on Sundays, Tuesdays, Wednesday and Friday  in the evening. Take 1.5 tablets (7.5 mg) by mouth on Monday,Thursday & Saturday in the evening.     Zinc 50 MG TABS Take 50 mg by mouth in the morning.     No current facility-administered medications for this visit.    Allergies:   Shellfish allergy and Sudafed [pseudoephedrine]   Social History: Social History   Socioeconomic History   Marital status: Married    Spouse name: Not on file   Number of children: Not on file   Years of education: Not on file  Highest education level: Not on file  Occupational History   Not on file  Tobacco Use   Smoking status: Former    Packs/day: 1.00    Years: 2.00    Total pack years: 2.00    Types: Cigarettes    Start date: 09/12/1989    Quit date: 09/13/1991    Years since quitting: 31.1   Smokeless tobacco: Former    Types: Chew    Quit date: 09/12/1996  Vaping Use   Vaping Use: Never used  Substance and Sexual Activity   Alcohol use: No    Alcohol/week: 0.0 standard drinks of alcohol   Drug use: No   Sexual activity: Yes  Other Topics Concern   Not on file  Social History Narrative   Lives with wife in [place].  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[employer]   Social Determinants of Health   Financial Resource Strain: Not on file  Food Insecurity: Not on file  Transportation Needs: Not on file  Physical Activity: Not on file  Stress: Not on file  Social Connections: Not on file  Intimate Partner Violence: Not on file    Family History: Family History  Problem Relation Age of Onset   Coronary artery disease Mother    Coronary artery disease Father    Ulcers Father    Lung cancer Father    Diabetes Sister    Hypertension Sister    Rheum arthritis Sister     Hypertension Brother    Coronary artery disease Maternal Grandmother    Stroke Maternal Grandfather    Coronary artery disease Paternal Grandmother    Coronary artery disease Paternal Grandfather      Review of Systems: All other systems reviewed and are otherwise negative except as noted above.  Physical Exam: There were no vitals filed for this visit.    Gen: Appears comfortable, well-nourished CV: RRR, no dependent edema The device site is normal -- no tenderness, edema, drainage, redness, threatened erosion. Pulm: breathing easily   PPM Interrogation-  reviewed in detail today,  See PACEART report.  EKG:  EKG is not ordered today. Personal review of ekg ordered  9/8  shows V pacing at 60 bpm   Recent Labs: 03/14/2022: ALT 23; ALT 23 06/27/2022: BUN 16; Creatinine, Ser 1.21; Hemoglobin 14.0; Platelets 165; Potassium 4.4; Sodium 142   Wt Readings from Last 3 Encounters:  07/08/22 198 lb (89.8 kg)  06/27/22 198 lb 6.4 oz (90 kg)  05/25/22 193 lb 12.8 oz (87.9 kg)     Other studies Reviewed: Additional studies/ records that were reviewed today include: Previous EP office notes, Previous remote checks, Most recent labwork.   [place] 02/2022 The LAD has a calcified moderate proximal stenosis and diffuse moderate disease in the mid vessel. No change from last cath The Circumflex has diffuse moderate mid stenosis. Small to moderate caliber obtuse marginal branch with moderate to severe proximal stenosis.  The RCA is a large dominant, heavily calcified vessel. The right posterolateral artery has diffuse moderate stenosis, unchanged from last cath.    TTE 06/27/2022 Septal and apical akinesis. Left ventricular ejection fraction, by estimation, is 40 to 45%. The left ventricle has mildly decreased function. The left ventricle demonstrates regional wall motion abnormalities (see scoring diagram/findings for description).  Left atrium is severely dilated.  Assessment and  Plan:  1. 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 Cardiomyopathy: ischemic, possibly  exacerbated by RV pacing. Device now with CRT capability Will repeat TTE Re-establish with Dr. Johnsie Cancel for CHF medical management  3. 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  Consider mexitil vs amio if recurs  6. HTN Stable on current regimen    7. CAD Denies s/s ischemia Myoview 02/2022 with EF ~45% and prior infarct, no ischemia.  Cath 03/10/2022 with moderate diffuse disease as above with borderline OM lesion.   7. HL Continue crestor 53m daily  Current medicines are reviewed at length with the patient today.     Signed, AMelida Quitter MD  10/27/2022 9:06 AM

## 2022-10-27 NOTE — Patient Instructions (Addendum)
Medication Instructions:  Continue all current medications.  Labwork: none  Testing/Procedures: Your physician has requested that you have an echocardiogram. Echocardiography is a painless test that uses sound waves to create images of your heart. It provides your doctor with information about the size and shape of your heart and how well your heart's chambers and valves are working. This procedure takes approximately one hour. There are no restrictions for this procedure. Please do NOT wear cologne, perfume, aftershave, or lotions (deodorant is allowed). Please arrive 15 minutes prior to your appointment time. Office will contact with results via phone, letter or mychart.     Follow-Up: 1 year - Dr.  Myles Gip  Re-establish with Dr. Johnsie Cancel    Any Other Special Instructions Will Be Listed Below (If Applicable).   If you need a refill on your cardiac medications before your next appointment, please call your pharmacy.

## 2022-10-27 NOTE — Addendum Note (Signed)
Addended by: Merlene Laughter on: 10/27/2022 03:43 PM   Modules accepted: Orders

## 2022-10-27 NOTE — Addendum Note (Signed)
Addended by: Laurine Blazer on: 10/27/2022 11:42 AM   Modules accepted: Orders

## 2022-11-08 DIAGNOSIS — K2100 Gastro-esophageal reflux disease with esophagitis, without bleeding: Secondary | ICD-10-CM | POA: Diagnosis not present

## 2022-11-08 DIAGNOSIS — M1009 Idiopathic gout, multiple sites: Secondary | ICD-10-CM | POA: Diagnosis not present

## 2022-11-08 DIAGNOSIS — Z0000 Encounter for general adult medical examination without abnormal findings: Secondary | ICD-10-CM | POA: Diagnosis not present

## 2022-11-08 DIAGNOSIS — E1142 Type 2 diabetes mellitus with diabetic polyneuropathy: Secondary | ICD-10-CM | POA: Diagnosis not present

## 2022-11-08 DIAGNOSIS — I10 Essential (primary) hypertension: Secondary | ICD-10-CM | POA: Diagnosis not present

## 2022-11-08 DIAGNOSIS — Z1331 Encounter for screening for depression: Secondary | ICD-10-CM | POA: Diagnosis not present

## 2022-11-08 DIAGNOSIS — I4891 Unspecified atrial fibrillation: Secondary | ICD-10-CM | POA: Diagnosis not present

## 2022-11-08 DIAGNOSIS — E782 Mixed hyperlipidemia: Secondary | ICD-10-CM | POA: Diagnosis not present

## 2022-11-22 DIAGNOSIS — I4891 Unspecified atrial fibrillation: Secondary | ICD-10-CM | POA: Diagnosis not present

## 2022-11-22 NOTE — Progress Notes (Signed)
Remote pacemaker transmission.   

## 2022-12-13 DIAGNOSIS — I4891 Unspecified atrial fibrillation: Secondary | ICD-10-CM | POA: Diagnosis not present

## 2022-12-28 ENCOUNTER — Other Ambulatory Visit (INDEPENDENT_AMBULATORY_CARE_PROVIDER_SITE_OTHER): Payer: Self-pay | Admitting: Gastroenterology

## 2022-12-28 NOTE — Telephone Encounter (Signed)
Will refill medication for 3 months, needs follow up appointment with any provider in order to receive any refills.  Thanks,  Haset Oaxaca Castaneda, MD Gastroenterology and Hepatology [place] [facility]  

## 2022-12-30 ENCOUNTER — Encounter (INDEPENDENT_AMBULATORY_CARE_PROVIDER_SITE_OTHER): Payer: Self-pay | Admitting: *Deleted

## 2023-01-09 ENCOUNTER — Ambulatory Visit (INDEPENDENT_AMBULATORY_CARE_PROVIDER_SITE_OTHER): Payer: Medicare Other

## 2023-01-09 DIAGNOSIS — I495 Sick sinus syndrome: Secondary | ICD-10-CM | POA: Diagnosis not present

## 2023-01-09 LAB — CUP PACEART REMOTE DEVICE CHECK
Battery Remaining Longevity: 101 mo
Battery Remaining Percentage: 95.5 %
Battery Voltage: 3.01 V
Date Time Interrogation Session: 20240429020008
Implantable Lead Connection Status: 753985
Implantable Lead Connection Status: 753985
Implantable Lead Connection Status: 753985
Implantable Lead Implant Date: 20130801
Implantable Lead Implant Date: 20130801
Implantable Lead Implant Date: 20231027
Implantable Lead Location: 753858
Implantable Lead Location: 753859
Implantable Lead Location: 753860
Implantable Lead Model: 1948
Implantable Pulse Generator Implant Date: 20231027
Lead Channel Impedance Value: 530 Ohm
Lead Channel Impedance Value: 850 Ohm
Lead Channel Pacing Threshold Amplitude: 1 V
Lead Channel Pacing Threshold Amplitude: 1.125 V
Lead Channel Pacing Threshold Pulse Width: 0.5 ms
Lead Channel Pacing Threshold Pulse Width: 0.5 ms
Lead Channel Sensing Intrinsic Amplitude: 7.4 mV
Lead Channel Setting Pacing Amplitude: 2 V
Lead Channel Setting Pacing Amplitude: 2.125
Lead Channel Setting Pacing Pulse Width: 0.5 ms
Lead Channel Setting Pacing Pulse Width: 0.5 ms
Lead Channel Setting Sensing Sensitivity: 2 mV
Pulse Gen Model: 3562
Pulse Gen Serial Number: 8114542

## 2023-01-11 ENCOUNTER — Telehealth: Payer: Self-pay

## 2023-01-11 NOTE — Telephone Encounter (Signed)
Patient called in stating he got a message on his last reading 01/09/2023 that one of his leads is "null" and he would like a call back for explanation

## 2023-01-11 NOTE — Telephone Encounter (Signed)
Pt LMOVM for nurse to return his call. His number is [phone].

## 2023-01-11 NOTE — Telephone Encounter (Signed)
Called patient to follow up. Wanted to advise patient "NULL" is the way the report is generated and does not show any concerns for device. Remote transmission appears normal for patient.   No answer, [facility].

## 2023-01-11 NOTE — Telephone Encounter (Signed)
Advised to patient "Null" is the way mychart processes the report. Advised patient no concerns about his device. Advised if MD has any recommendations we will call him back. Voiced understanding.

## 2023-01-13 DIAGNOSIS — I4891 Unspecified atrial fibrillation: Secondary | ICD-10-CM | POA: Diagnosis not present

## 2023-01-19 ENCOUNTER — Telehealth (INDEPENDENT_AMBULATORY_CARE_PROVIDER_SITE_OTHER): Payer: Self-pay | Admitting: Gastroenterology

## 2023-01-19 NOTE — Telephone Encounter (Signed)
Who is your primary care physician: Dr.Hasanaj  Reasons for the EGD: 1 year recall  Are you diabetic? If yes, Type 1 or Type 2?    Yes Type 2  Do you have a prosthetic or mechanical heart valve? No  Do you have a pacemaker/defibrillator?   Yes-Pacemaker  Have you had endocarditis/atrial fibrillation? Yes-atrial fibrillation  Have you had joint replacement within the last 12 months?  No  Do you have any history of drugs or alchohol?  Yes alcohol but not since 1991  Do you use supplemental oxygen?  No  Have you had a stroke or heart attack within the last 6 months?No  Do you take weight loss medication? No      Do you take any blood-thinning medications such as: (aspirin, warfarin, Plavix, Aggrenox)  Yes Warfarin  If yes we need the name, milligram, dosage and who is prescribing  Current Outpatient Medications on File Prior to Visit  Medication Sig Dispense Refill   allopurinol (ZYLOPRIM) 300 MG tablet Take 300 mg by mouth in the morning.     Calcium Carb-Cholecalciferol 600-500 MG-UNIT CAPS Take 1 tablet by mouth in the morning.     Carboxymeth-Glyc-Polysorb PF (REFRESH OPTIVE MEGA-3) 0.5-1-0.5 % SOLN Place 1 drop into both eyes in the morning.     carvedilol (COREG) 12.5 MG tablet Take 1 tablet (12.5 mg total) by mouth 2 (two) times daily. 180 tablet 3   Cholecalciferol (VITAMIN D) 125 MCG (5000 UT) CAPS Take 5,000 Units by mouth at bedtime.     clonazePAM (KLONOPIN) 1 MG tablet Take 1 mg by mouth at bedtime.     Cyanocobalamin (VITAMIN B 12 PO) Take 5,000 mcg by mouth daily with supper.     famotidine (PEPCID) 40 MG tablet TAKE ONE TABLET BY MOUTH AT BEDTIME 90 tablet 0   glipiZIDE (GLUCOTROL XL) 2.5 MG 24 hr tablet Take 2.5 mg by mouth in the morning.     metFORMIN (GLUCOPHAGE) 1000 MG tablet Take 1,000 mg by mouth 2 (two) times daily with a meal.     Multiple Vitamins-Minerals (PRESERVISION AREDS 2) CAPS Take 1 capsule by mouth in the morning and at bedtime.      olmesartan-hydrochlorothiazide (BENICAR HCT) 20-12.5 MG tablet Take 1 tablet by mouth in the morning.     Omega-3 Fatty Acids (FISH OIL) 1000 MG CAPS Take 1,000 mg by mouth in the morning and at bedtime.     omeprazole (PRILOSEC) 40 MG capsule Take one po daily. 90 capsule 3   rosuvastatin (CRESTOR) 5 MG tablet TAKE 1 TABLET EVERY MONDAY, WEDNESDAY, &FRIDAY (Patient taking differently: Take 5 mg by mouth every Monday, Wednesday, and Friday at 8 PM.) 38 tablet 2   warfarin (COUMADIN) 5 MG tablet Take 5-7.5 mg by mouth See admin instructions. Take 1 tablet (5 mg) by mouth on Sundays, Tuesdays, Wednesday and Friday  in the evening. Take 1.5 tablets (7.5 mg) by mouth on Monday,Thursday & Saturday in the evening.     Zinc 50 MG TABS Take 50 mg by mouth in the morning.     No current facility-administered medications on file prior to visit.    Allergies  Allergen Reactions   Shellfish Allergy Nausea And Vomiting    Shrimp   Sudafed [Pseudoephedrine] Other (See Comments)    Unknown reaction      Primary Insurance Name: Medicare;[employer] number where you can be reached: [phone]

## 2023-01-19 NOTE — Telephone Encounter (Signed)
Room 3, needs clearance to hold warfarin Thanks

## 2023-01-20 ENCOUNTER — Telehealth (INDEPENDENT_AMBULATORY_CARE_PROVIDER_SITE_OTHER): Payer: Self-pay | Admitting: Gastroenterology

## 2023-01-20 NOTE — Telephone Encounter (Signed)
Patient with diagnosis of afib on warfarin for anticoagulation.    Procedure: EGD Date of procedure: TBD  CHA2DS2-VASc Score = 5   This indicates a 7.2% annual risk of stroke. The patient's score is based upon: CHF History: 1 HTN History: 1 Diabetes History: 1 Stroke History: 0 Vascular Disease History: 1 Age Score: 1 Gender Score: 0      Per office protocol, patient can hold warfarin for 5 days prior to procedure.   Patient will NOT need bridging with Lovenox (enoxaparin) around procedure.  **This guidance is not considered finalized until pre-operative APP has relayed final recommendations.**

## 2023-01-20 NOTE — Telephone Encounter (Signed)
   Patient Name: Paul Dunn  DOB: 12/28/1950 MRN: [id number]  Primary Cardiologist: Charlton Haws, MD  Chart reviewed as part of pre-operative protocol coverage. Pre-op clearance already addressed by colleagues in earlier phone notes. To summarize recommendations:  -Per office protocol, patient can hold warfarin for 5 days prior to procedure.   Patient will NOT need bridging with Lovenox (enoxaparin) around procedure  Medical clearance was not requested  Will route this bundled recommendation to requesting provider via Epic fax function and remove from pre-op pool. Please call with questions.  Sharlene Dory, PA-C 01/20/2023, 12:59 PM

## 2023-01-20 NOTE — Telephone Encounter (Signed)
Medication clearance sent to pool.  

## 2023-01-20 NOTE — Telephone Encounter (Signed)
    01/20/23  Waynard Reeds [birth year]  What type of surgery is being performed? EGD  When is surgery scheduled? TBD  Clearance to hold Warfarin  Name of physician performing surgery?  Dr. Katrinka Blazing [facility] Phone: [phone] Fax: [phone]  Anethesia type (none, local, MAC, general)? MAC

## 2023-01-23 NOTE — Telephone Encounter (Signed)
Pt returned call. EGD scheduled for 03/14/23. Instructed pt to hold Warfarin for 5 days. Instructions placed up front for pick up. Will contact with pre op appt.

## 2023-01-23 NOTE — Telephone Encounter (Signed)
Per Michiel Sites PA-C: Per office protocol, patient can hold warfarin for 5 days prior to procedure.   Patient will NOT need bridging with Lovenox (enoxaparin) around procedure.  Left message to return call

## 2023-01-24 DIAGNOSIS — L570 Actinic keratosis: Secondary | ICD-10-CM | POA: Diagnosis not present

## 2023-01-24 DIAGNOSIS — X32XXXD Exposure to sunlight, subsequent encounter: Secondary | ICD-10-CM | POA: Diagnosis not present

## 2023-01-25 ENCOUNTER — Encounter (INDEPENDENT_AMBULATORY_CARE_PROVIDER_SITE_OTHER): Payer: Self-pay

## 2023-02-02 ENCOUNTER — Ambulatory Visit: Payer: Medicare Other | Attending: Cardiovascular Disease

## 2023-02-02 DIAGNOSIS — I420 Dilated cardiomyopathy: Secondary | ICD-10-CM | POA: Insufficient documentation

## 2023-02-02 LAB — ECHOCARDIOGRAM COMPLETE
AR max vel: 1.88 cm2
AV Area VTI: 2.16 cm2
AV Area mean vel: 2.19 cm2
AV Mean grad: 4.3 mmHg
AV Peak grad: 8.8 mmHg
Ao pk vel: 1.48 m/s
Area-P 1/2: 3.99 cm2
Calc EF: 56.8 %
MV M vel: 4.65 m/s
MV Peak grad: 86.5 mmHg
S' Lateral: 3.7 cm
Single Plane A2C EF: 44.9 %
Single Plane A4C EF: 66.5 %

## 2023-02-05 NOTE — Progress Notes (Signed)
Cardiology Office Note    Date:  02/13/2023   ID:  KUE FAMBROUGH, DOB Feb 18, 1951, MRN [id number]  PCP:  Toma Deiters, MD  Cardiologist: Charlton Haws, MD   EP: Dr. Nelly Laurence   History of Present Illness:    Paul Dunn is a 72 y.o. 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 Therefore, cath recommended   Cath 03/10/22 showed moderate CAD with tightest lesions in OM 65% most distal PLB 70% with major epicardial vessels being non critical Medical Rx recommended. Not having angina  Has had some NSVT improved on beta blocker VT zone on AICD changes to > 30 seconds Had upgrade of device to BiV ICD 10/27/22 with EF 40% and high burden of RV pacing   TTE 02/02/23 EF improved 55-60% AV sclerosis and mild MR  Feels much better Less LE edema wears support stockings daily  Married 51 years has son in [place] and one in [place] with 4 grand kids there    Past Medical History:  Diagnosis Date   Asthma    "when I was a child"   Atrial fibrillation (HCC)    long standing persistent   Bulging of intervertebral disc between L4 and L5    GERD (gastroesophageal reflux disease)    Gouty arthritis    H/O hiatal hernia    High cholesterol    History of blood transfusion ~ 1984   History of bronchitis    "used to get it twice/year; last time was ~ 2 yr ago" (04/12/2012)   HTN (hypertension)    Kidney cysts    "never treated it"   Macular degeneration    Peripheral vascular disease (HCC)    Pulmonary nodule    Skin cancer    Tachycardia-bradycardia syndrome (HCC)    s/p PPM implant by Dr Johney Frame  04/12/12   Type 2 diabetes mellitus (HCC)     Past Surgical History:  Procedure Laterality Date   BIOPSY  01/18/2022   Procedure: BIOPSY;  Surgeon: Dolores Frame, MD;  [facility];  Service: Gastroenterology;;   Earnstine Regal N/A 07/08/2022   Procedure: BIV PPM  UPGRADE;  Surgeon: Maurice Small, MD;  Location: [facility] INVASIVE CV LAB;  Service: Cardiovascular;  Laterality: N/A;   CARDIAC CATHETERIZATION N/A 06/23/2016   Procedure: Left Heart Cath and Coronary Angiography;  Surgeon: Yvonne Kendall, MD;  Location: [facility] INVASIVE CV LAB;  Service: Cardiovascular;  Laterality: N/A;   CATARACT EXTRACTION, BILATERAL     COLONOSCOPY N/A 05/22/2019   Procedure: COLONOSCOPY;  Surgeon: Malissa Hippo, MD;  Location: [facility];  Service: Endoscopy;  Laterality: N/A;  930   ESOPHAGEAL DILATION  01/2012   ESOPHAGOGASTRODUODENOSCOPY (EGD) WITH PROPOFOL N/A 01/01/2021   Procedure: ESOPHAGOGASTRODUODENOSCOPY (EGD) WITH PROPOFOL;  Surgeon: Dolores Frame, MD;  [facility]: [facility];  Service: Gastroenterology;  Laterality: N/A;  AM   ESOPHAGOGASTRODUODENOSCOPY (EGD) WITH PROPOFOL N/A 01/18/2022   Procedure: ESOPHAGOGASTRODUODENOSCOPY (EGD) WITH PROPOFOL;  Surgeon: Dolores Frame, MD;  [facility];  Service: Gastroenterology;  Laterality: N/A;  935   HEMOSTASIS CLIP PLACEMENT  01/01/2021  Procedure: HEMOSTASIS CLIP PLACEMENT;  Surgeon: Marguerita Merles, Reuel Boom, MD;  [facility]: [facility];  Service: Gastroenterology;;   LACERATION REPAIR  ~ 1984   left forearm; "cut it w/a power saw"   LEFT HEART CATH AND CORONARY ANGIOGRAPHY N/A 03/10/2022   Procedure: LEFT HEART CATH AND CORONARY ANGIOGRAPHY;  Surgeon: Kathleene Hazel, MD;  Location: [facility] INVASIVE CV LAB;  Service: Cardiovascular;  Laterality: N/A;   MELANOMA EXCISION  ~ 2011   left posterior shoulder   PACEMAKER INSERTION  04/12/2012   SJM [employer] DR RF pacemaker implanted by Dr Johney Frame for  tachy/brady syndrome   PERMANENT PACEMAKER INSERTION N/A 04/12/2012   Procedure: PERMANENT PACEMAKER INSERTION;  Surgeon: Hillis Range, MD;  Location: [facility] CATH LAB;  Service: Cardiovascular;  Laterality: N/A;   POLYPECTOMY  05/22/2019   Procedure: POLYPECTOMY;  Surgeon: Malissa Hippo, MD;  [facility];  Service: Endoscopy;;  transverse, cecum,ascending,    POLYPECTOMY  01/01/2021   Procedure: POLYPECTOMY;  Surgeon: Dolores Frame, MD;  Location: [facility];  Service: Gastroenterology;;  duodenal gastric    Current Medications: Outpatient Medications Prior to Visit  Medication Sig Dispense Refill   allopurinol (ZYLOPRIM) 300 MG tablet Take 300 mg by mouth in the morning.     Calcium Carb-Cholecalciferol 600-500 MG-UNIT CAPS Take 1 tablet by mouth in the morning.     Carboxymeth-Glyc-Polysorb PF (REFRESH OPTIVE MEGA-3) 0.5-1-0.5 % SOLN Place 1 drop into both eyes in the morning.     carvedilol (COREG) 12.5 MG tablet Take 1 tablet (12.5 mg total) by mouth 2 (two) times daily. 180 tablet 3   Cholecalciferol (VITAMIN D) 125 MCG (5000 UT) CAPS Take 5,000 Units by mouth at bedtime.     clonazePAM (KLONOPIN) 1 MG tablet Take 1 mg by mouth at bedtime.     Cyanocobalamin (VITAMIN B 12 PO) Take 5,000 mcg by mouth daily with supper.     famotidine (PEPCID) 40 MG tablet TAKE ONE TABLET BY MOUTH AT BEDTIME 90 tablet 0   glipiZIDE (GLUCOTROL XL) 2.5 MG 24 hr tablet Take 2.5 mg by mouth in the morning.     metFORMIN (GLUCOPHAGE) 1000 MG tablet Take 1,000 mg by mouth 2 (two) times daily with a meal.     Multiple Vitamins-Minerals (PRESERVISION AREDS 2) CAPS Take 1 capsule by mouth in the morning and at bedtime.     olmesartan-hydrochlorothiazide (BENICAR HCT) 20-12.5 MG tablet Take 1 tablet by mouth in the morning.     Omega-3 Fatty Acids (FISH OIL) 1000 MG CAPS Take 1,000 mg by mouth in the morning and at bedtime.     omeprazole (PRILOSEC) 40 MG capsule Take one po daily. 90 capsule  3   rosuvastatin (CRESTOR) 5 MG tablet TAKE 1 TABLET EVERY MONDAY, WEDNESDAY, &FRIDAY (Patient taking differently: Take 5 mg by mouth every Monday, Wednesday, and Friday at 8 PM.) 38 tablet 2   warfarin (COUMADIN) 5 MG tablet Take 5-7.5 mg by mouth See admin instructions. Take 1 tablet (5 mg) by mouth on Sundays, Tuesdays, Wednesday and Friday  in the evening. Take 1.5 tablets (7.5 mg) by mouth on Monday,Thursday & Saturday in the evening.     Zinc 50 MG TABS Take 50 mg by mouth in the morning.     No facility-administered medications prior to visit.     Allergies:   Shellfish allergy and Sudafed [pseudoephedrine]   Social History   Socioeconomic History   Marital status: Married    Spouse name:  Not on file   Number of children: Not on file   Years of education: Not on file   Highest education level: Not on file  Occupational History   Not on file  Tobacco Use   Smoking status: Former    Packs/day: 1.00    Years: 2.00    Additional pack years: 0.00    Total pack years: 2.00    Types: Cigarettes    Start date: 09/12/1989    Quit date: 09/13/1991    Years since quitting: 31.4   Smokeless tobacco: Former    Types: Chew    Quit date: 09/12/1996  Vaping Use   Vaping Use: Never used  Substance and Sexual Activity   Alcohol use: No    Alcohol/week: 0.0 standard drinks of alcohol   Drug use: No   Sexual activity: Yes  Other Topics Concern   Not on file  Social History Narrative   Lives with wife in [place].  He is the Education officer, environmental of [employer]   Social Determinants of Health   Financial Resource Strain: Not on file  Food Insecurity: Not on file  Transportation Needs: Not on file  Physical Activity: Not on file  Stress: Not on file  Social Connections: Not on file     Family History:  The patient's family history includes Coronary artery disease in his father, maternal grandmother, mother, paternal grandfather, and paternal grandmother; Diabetes in his  sister; Hypertension in his brother and sister; Lung cancer in his father; Rheum arthritis in his sister; Stroke in his maternal grandfather; Ulcers in his father.   Review of Systems:    Please see the history of present illness.     All other systems reviewed and are otherwise negative except as noted above.   Physical Exam:    VS:  BP 126/82   Pulse 72   Ht 5\' 11"  (1.803 m)   Wt 190 lb (86.2 kg)   SpO2 98%   BMI 26.50 kg/m     Affect appropriate Healthy:  appears stated age HEENT: normal Neck supple with no adenopathy JVP normal no bruits no thyromegaly Lungs clear with no wheezing and good diaphragmatic motion Heart:  S1/S2 no murmur, no rub, gallop or click PMI normal  Device under left clavicle  Abdomen: benighn, BS positve, no tenderness, no AAA no bruit.  No HSM or HJR Distal pulses intact with no bruits No edema Neuro non-focal Skin warm and dry No muscular weakness   Wt Readings from Last 3 Encounters:  02/13/23 190 lb (86.2 kg)  10/27/22 194 lb 6.4 oz (88.2 kg)  07/08/22 198 lb (89.8 kg)     Studies/Labs Reviewed:   EKG:   V pacing with RBBB 10/27/22   Recent Labs: 03/14/2022: ALT 23; ALT 23 06/27/2022: BUN 16; Creatinine, Ser 1.21; Hemoglobin 14.0; Platelets 165; Potassium 4.4; Sodium 142   Lipid Panel No results found for: "CHOL", "TRIG", "HDL", "CHOLHDL", "VLDL", "LDLCALC", "LDLDIRECT"  Additional studies/ records that were reviewed today include:   LHC: 03/10/22  LEFT HEART CATH AND CORONARY ANGIOGRAPHY   Conclusion      RPAV lesion is 70% stenosed.   3rd RPL lesion is 70% stenosed.   Prox Cx lesion is 20% stenosed.   Dist RCA lesion is 10% stenosed.   Ost 2nd Mrg lesion is 40% stenosed.   Ost 3rd Mrg lesion is 30% stenosed.   Prox RCA to Mid RCA lesion is 20% stenosed.   RPDA lesion is 40%  stenosed.   Mid Cx lesion is 50% stenosed.   2nd Mrg lesion is 65% stenosed.   Mid LAD to Dist LAD lesion is 40% stenosed.   Prox LAD lesion is  50% stenosed.   The left ventricular systolic function is normal.   LV end diastolic pressure is normal.   The left ventricular ejection fraction is 50-55% by visual estimate.   There is no mitral valve regurgitation.   The LAD has a calcified moderate proximal stenosis and diffuse moderate disease in the mid vessel. No change from last cath The Circumflex has diffuse moderate mid stenosis. Small to moderate caliber obtuse marginal branch with moderate to severe proximal stenosis.  The RCA is a large dominant, heavily calcified vessel. The right posterolateral artery has diffuse moderate stenosis, unchanged from last cath.  LV systolic function appears to be low normal.  Normal LVEDP   Recommendations: Inferior wall perfusion defect on nuclear stress test consistent with defect seen on scan in 2017. He has moderate CAD with moderately severe disease in the small to moderate caliber obtuse marginal branch. No high grade disease in the RCA or LAD. He is having no chest pain. LV function appears to be low normal. I would favor medical management of his CAD. If he were to develop angina, could consider PCI of the obtuse marginal branch.     Wall Motion              Left Heart  Left Ventricle The left ventricular size is normal. The left ventricular systolic function is normal. LV end diastolic pressure is normal. The left ventricular ejection fraction is 50-55% by visual estimate. No regional wall motion abnormalities. There is no evidence of mitral regurgitation.     Coronary Diagrams  Diagnostic Dominance: Right  Intervention   Flowsheet Row Most Recent Value  AO Systolic Pressure 95 mmHg  AO Diastolic Pressure 51 mmHg  AO Mean 70 mmHg  LV Systolic Pressure 105 mmHg  LV Diastolic Pressure 26 mmHg  LV EDP 12 mmHg  AOp Systolic Pressure 95 mmHg  AOp Diastolic Pressure 49 mmHg  AOp Mean Pressure 69 mmHg  LVp Systolic Pressure 103 mmHg  LVp Diastolic Pressure 3 mmHg  LVp EDP  Pressure 13 mmHg   Electronically signed by Kathleene Hazel, MD on 03/10/22 at [phone] EDT     NST: 02/2022 Large fixed defect in the inferior segments and inferoseptum into the apex. Hypokinesis in this segments. The findings either represent infarct vs diaphragm attenuation.   LV perfusion is abnormal. There is no evidence of ischemia. There is evidence of infarction. Defect 1: There is a large defect with severe reduction in uptake present in the apical to basal inferior, inferoseptal and apex location(s) that is fixed. There is abnormal wall motion in the defect area. Consistent with infarction.   Left ventricular function is abnormal. Nuclear stress EF: 45 %. The left ventricular ejection fraction is mildly decreased (45-54%). End diastolic cavity size is mildly enlarged.   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 The study is intermediate risk.   Assessment:    No diagnosis found.    Plan:   In order of problems listed above:  1. CAD/Abnormal NST -  Cath 03/10/22 Moderate OM/distal PLB disease 50% proximal LAD medical Rx no angina and active continue medical Rx EF now normal with BiV upgrade by TTE 02/02/23   2. Permanent Atrial Fibrillation - He denies any recent palpitations and HR is well-controlled in the 60's  today. Continue Cardizem CD 180mg  daily.  - No reports of active bleeding. He remains on Coumadin for anticoagulation.  - change to DOAC if affordable   3. Symptomatic bradycardia - He is s/p St. Jude PPM placement which is followed by Dr. Nelly Laurence now .   4. HTN - His BP is well-controlled at 122/64 during today's visit. Continue current medical therapy with Cardizem CD 180mg  daily and Olmesartan-HCTZ 20-12.5mg  daily.   5. HLD - Followed by his PCP. Will request a copy of most recent labs. He remains on Crestor 5mg  MF as he has been intolerant to higher-intensity statin therapy in the past secondary to myalgias.   6. NSVT:  - in setting of  moderate CAD. Had BiV upgrade with improvement in EF and on beta blocker VT zone changed to > 30 seconds by EP Consider adding Mexitil in future     Medication Adjustments/Labs and Tests Ordered: Current medicines are reviewed at length with the patient today.  Concerns regarding medicines are outlined above.  Medication changes, Labs and Tests ordered today are listed in the Patient Instructions below. There are no Patient Instructions on file for this visit.   Signed, Charlton Haws, MD  02/13/2023 10:06 AM    [facility] [address] [place], [place] [address] Phone: [phone] Fax: [phone]

## 2023-02-07 ENCOUNTER — Other Ambulatory Visit: Payer: Medicare Other

## 2023-02-09 NOTE — Progress Notes (Signed)
Remote pacemaker transmission.   

## 2023-02-13 ENCOUNTER — Telehealth: Payer: Self-pay | Admitting: Cardiovascular Disease

## 2023-02-13 ENCOUNTER — Encounter: Payer: Self-pay | Admitting: Cardiovascular Disease

## 2023-02-13 ENCOUNTER — Ambulatory Visit: Payer: Medicare Other | Attending: Cardiovascular Disease | Admitting: Cardiovascular Disease

## 2023-02-13 VITALS — BP 126/82 | HR 72 | Ht 71.0 in | Wt 190.0 lb

## 2023-02-13 DIAGNOSIS — I420 Dilated cardiomyopathy: Secondary | ICD-10-CM | POA: Diagnosis not present

## 2023-02-13 DIAGNOSIS — Z950 Presence of cardiac pacemaker: Secondary | ICD-10-CM | POA: Diagnosis not present

## 2023-02-13 DIAGNOSIS — I2510 Atherosclerotic heart disease of native coronary artery without angina pectoris: Secondary | ICD-10-CM | POA: Insufficient documentation

## 2023-02-13 DIAGNOSIS — I4891 Unspecified atrial fibrillation: Secondary | ICD-10-CM | POA: Diagnosis not present

## 2023-02-13 DIAGNOSIS — I472 Ventricular tachycardia, unspecified: Secondary | ICD-10-CM | POA: Diagnosis not present

## 2023-02-13 NOTE — Telephone Encounter (Signed)
Patient wants AVS changed to reflect he only has a bi-v pacer, no ICD

## 2023-02-13 NOTE — Telephone Encounter (Addendum)
10/27/2022 He underwent upgrade to a BiV ICD with placement of a LV lead due to decline in EF to 40% with a a high burden of RV pacing. He reports that he feels better, and his dependent edema as resolved.

## 2023-02-13 NOTE — Telephone Encounter (Signed)
Pt states that on AVS from today's visit it states that he has a Defibrillator. Pt wants to make provider aware that he has a Pacemaker. Please advise

## 2023-02-13 NOTE — Patient Instructions (Addendum)
Medication Instructions:  Your physician recommends that you continue on your current medications as directed. Please refer to the Current Medication list given to you today.  *If you need a refill on your cardiac medications before your next appointment, please call your pharmacy*   Lab Work: NONE   If you have labs (blood work) drawn today and your tests are completely normal, you will receive your results only by: MyChart Message (if you have MyChart) OR A paper copy in the mail If you have any lab test that is abnormal or we need to change your treatment, we will call you to review the results.   Testing/Procedures: NONE    Follow-Up: At [facility], you and your health needs are our priority.  As part of our continuing mission to provide you with exceptional heart care, we have created designated Provider Care Teams.  These Care Teams include your primary Cardiologist (physician) and Advanced Practice Providers (APPs -  Physician Assistants and Nurse Practitioners) who all work together to provide you with the care you need, when you need it.  We recommend signing up for the patient portal called "MyChart".  Sign up information is provided on this After Visit Summary.  MyChart is used to connect with patients for Virtual Visits (Telemedicine).  Patients are able to view lab/test results, encounter notes, upcoming appointments, etc.  Non-urgent messages can be sent to your provider as well.   To learn more about what you can do with MyChart, go to [email or web address].    Your next appointment:   1 Year    Provider:   Charlton Haws, MD    Other Instructions Thank you for choosing [facility]!

## 2023-02-14 NOTE — Telephone Encounter (Signed)
Completed.

## 2023-02-20 DIAGNOSIS — I10 Essential (primary) hypertension: Secondary | ICD-10-CM | POA: Diagnosis not present

## 2023-02-20 DIAGNOSIS — E782 Mixed hyperlipidemia: Secondary | ICD-10-CM | POA: Diagnosis not present

## 2023-02-20 DIAGNOSIS — I4891 Unspecified atrial fibrillation: Secondary | ICD-10-CM | POA: Diagnosis not present

## 2023-02-20 DIAGNOSIS — E1142 Type 2 diabetes mellitus with diabetic polyneuropathy: Secondary | ICD-10-CM | POA: Diagnosis not present

## 2023-02-20 DIAGNOSIS — K2100 Gastro-esophageal reflux disease with esophagitis, without bleeding: Secondary | ICD-10-CM | POA: Diagnosis not present

## 2023-02-20 DIAGNOSIS — M1009 Idiopathic gout, multiple sites: Secondary | ICD-10-CM | POA: Diagnosis not present

## 2023-03-09 NOTE — Patient Instructions (Signed)
20    Your procedure is scheduled on: 03/14/2023  Report to [facility] Main Entrance at  8:45   AM.  Call this number if you have problems the morning of surgery: [phone]   Remember:   Follow instructions on letter from office regarding when to stop eating and drinking        No Smoking the day of procedure      Take these medicines the morning of surgery with A SIP OF WATER: Carvedilol, pepcid, and omeprazole   Do not wear jewelry, make-up or nail polish.  Do not wear lotions, powders, or perfumes. You may wear deodorant.                Do not bring valuables to the hospital.  Contacts, dentures or bridgework may not be worn into surgery.  Leave suitcase in the car. After surgery it may be brought to your room.  For patients admitted to the hospital, checkout time is 11:00 AM the day of discharge.   Patients discharged the day of surgery will not be allowed to drive home. Upper Endoscopy, Adult Upper endoscopy is a procedure to look inside the upper GI (gastrointestinal) tract. The upper GI tract is made up of: The part of the body that moves food from your mouth to your stomach (esophagus). The stomach. The first part of your small intestine (duodenum). This procedure is also called esophagogastroduodenoscopy (EGD) or gastroscopy. In this procedure, your health care provider passes a thin, flexible tube (endoscope) through your mouth and down your esophagus into your stomach. A small camera is attached to the end of the tube. Images from the camera appear on a monitor in the exam room. During this procedure, your health care provider may also remove a small piece of tissue to be sent to a lab and examined under a microscope (biopsy). Your health care provider may do an upper endoscopy to diagnose cancers of the upper GI tract. You may also have this procedure to find the cause of other conditions, such as: Stomach pain. Heartburn. Pain or problems when swallowing. Nausea and  vomiting. Stomach bleeding. Stomach ulcers. Tell a health care provider about: Any allergies you have. All medicines you are taking, including vitamins, herbs, eye drops, creams, and over-the-counter medicines. Any problems you or family members have had with anesthetic medicines. Any blood disorders you have. Any surgeries you have had. Any medical conditions you have. Whether you are pregnant or may be pregnant. What are the risks? Generally, this is a safe procedure. However, problems may occur, including: Infection. Bleeding. Allergic reactions to medicines. A tear or hole (perforation) in the esophagus, stomach, or duodenum. What happens before the procedure? Staying hydrated Follow instructions from your health care provider about hydration, which may include: Up to 4 hours before the procedure - you may continue to drink clear liquids, such as water, clear fruit juice, black coffee, and plain tea.   Medicines Ask your health care provider about: Changing or stopping your regular medicines. This is especially important if you are taking diabetes medicines or blood thinners. Taking medicines such as aspirin and ibuprofen. These medicines can thin your blood. Do not take these medicines unless your health care provider tells you to take them. Taking over-the-counter medicines, vitamins, herbs, and supplements. General instructions Plan to have someone take you home from the hospital or clinic. If you will be going home right after the procedure, plan to have someone with you for 24 hours. Ask  your health care provider what steps will be taken to help prevent infection. What happens during the procedure?  An IV will be inserted into one of your veins. You may be given one or more of the following: A medicine to help you relax (sedative). A medicine to numb the throat (local anesthetic). You will lie on your left side on an exam table. Your health care provider will pass the  endoscope through your mouth and down your esophagus. Your health care provider will use the scope to check the inside of your esophagus, stomach, and duodenum. Biopsies may be taken. The endoscope will be removed. The procedure may vary among health care providers and hospitals. What happens after the procedure? Your blood pressure, heart rate, breathing rate, and blood oxygen level will be monitored until you leave the hospital or clinic. Do not drive for 24 hours if you were given a sedative during your procedure. When your throat is no longer numb, you may be given some fluids to drink. It is up to you to get the results of your procedure. Ask your health care provider, or the department that is doing the procedure, when your results will be ready. Summary Upper endoscopy is a procedure to look inside the upper GI tract. During the procedure, an IV will be inserted into one of your veins. You may be given a medicine to help you relax. A medicine will be used to numb your throat. The endoscope will be passed through your mouth and down your esophagus. This information is not intended to replace advice given to you by your health care provider. Make sure you discuss any questions you have with your health care provider. Document Revised: 02/21/2018 Document Reviewed: 01/29/2018 Elsevier Patient Education  [employer] [facility]  Please read the instructions outlined below and refer to this sheet in the next few weeks. These discharge instructions provide you with general information on caring for yourself after you leave the hospital. Your doctor may also give you specific instructions. While your treatment has been planned according to the most current medical practices available, unavoidable complications occasionally occur. If you have any  problems or questions after discharge, please call your doctor. HOME CARE INSTRUCTIONS Activity You may resume your regular activity but move at a slower pace for the next 24 hours.  Take frequent rest periods for the next 24 hours.  Walking will help expel (get rid of) the air and reduce the bloated feeling in your abdomen.  No driving for 24 hours (because of the anesthesia (medicine) used during the test).  You may shower.  Do not sign any important legal documents or operate any machinery for 24 hours (because of the anesthesia  used during the test).  Nutrition Drink plenty of fluids.  You may resume your normal diet.  Begin with a light meal and progress to your normal diet.  Avoid alcoholic beverages for 24 hours or as instructed by your caregiver.  Medications You may resume your normal medications unless your caregiver tells you otherwise. What you can expect today You may experience abdominal discomfort such as a feeling of fullness or "gas" pains.  You may experience a sore throat for 2 to 3 days. This is normal. Gargling with salt water may help this.  Follow-up Your doctor will discuss the results of your test with you. SEEK IMMEDIATE MEDICAL CARE IF: You have excessive nausea (feeling sick to your stomach) and/or vomiting.  You have severe abdominal pain and distention (swelling).  You have trouble swallowing.  You have a temperature over 100 F (37.8 C).  You have rectal bleeding or vomiting of blood.  Document Released: 04/12/2004 Document Revised: 08/18/2011 Document Reviewed: 10/24/2007

## 2023-03-10 ENCOUNTER — Encounter (HOSPITAL_COMMUNITY)
Admission: RE | Admit: 2023-03-10 | Discharge: 2023-03-10 | Disposition: A | Discharge status: Home / Self Care | Payer: Medicare Other | Source: Ambulatory Visit | Attending: Gastroenterology | Admitting: Gastroenterology

## 2023-03-10 ENCOUNTER — Encounter (HOSPITAL_COMMUNITY): Payer: Self-pay

## 2023-03-10 VITALS — BP 142/85 | HR 70 | Temp 97.7°F | Resp 18 | Ht 71.0 in | Wt 190.0 lb

## 2023-03-10 DIAGNOSIS — Z79899 Other long term (current) drug therapy: Secondary | ICD-10-CM | POA: Insufficient documentation

## 2023-03-10 HISTORY — DX: Presence of cardiac pacemaker: Z95.0

## 2023-03-10 HISTORY — DX: Cardiac arrhythmia, unspecified: I49.9

## 2023-03-10 LAB — BASIC METABOLIC PANEL
Anion gap: 8 (ref 5–15)
BUN: 22 mg/dL (ref 8–23)
CO2: 26 mmol/L (ref 22–32)
Calcium: 9 mg/dL (ref 8.9–10.3)
Chloride: 103 mmol/L (ref 98–111)
Creatinine, Ser: 1.18 mg/dL (ref 0.61–1.24)
GFR, Estimated: >60 mL/min (ref >60)
Glucose, Bld: 157 mg/dL — ABNORMAL HIGH (ref 70–99)
Potassium: 4.5 mmol/L (ref 3.5–5.1)
Sodium: 137 mmol/L (ref 135–145)

## 2023-03-14 ENCOUNTER — Encounter (HOSPITAL_COMMUNITY)
Admission: RE | Disposition: A | Discharge status: Home / Self Care | Payer: Self-pay | Source: Home / Self Care | Attending: Gastroenterology

## 2023-03-14 ENCOUNTER — Ambulatory Visit (HOSPITAL_BASED_OUTPATIENT_CLINIC_OR_DEPARTMENT_OTHER): Payer: Medicare Other | Admitting: Certified Registered"

## 2023-03-14 ENCOUNTER — Ambulatory Visit (HOSPITAL_COMMUNITY)
Admission: RE | Admit: 2023-03-14 | Discharge: 2023-03-14 | Disposition: A | Discharge status: Home / Self Care | Payer: Medicare Other | Attending: Gastroenterology | Admitting: Gastroenterology

## 2023-03-14 ENCOUNTER — Ambulatory Visit (HOSPITAL_COMMUNITY): Payer: Medicare Other | Admitting: Certified Registered"

## 2023-03-14 DIAGNOSIS — Z79899 Other long term (current) drug therapy: Secondary | ICD-10-CM | POA: Diagnosis not present

## 2023-03-14 DIAGNOSIS — Z87891 Personal history of nicotine dependence: Secondary | ICD-10-CM | POA: Insufficient documentation

## 2023-03-14 DIAGNOSIS — E7800 Pure hypercholesterolemia, unspecified: Secondary | ICD-10-CM | POA: Insufficient documentation

## 2023-03-14 DIAGNOSIS — Z950 Presence of cardiac pacemaker: Secondary | ICD-10-CM | POA: Diagnosis not present

## 2023-03-14 DIAGNOSIS — I10 Essential (primary) hypertension: Secondary | ICD-10-CM

## 2023-03-14 DIAGNOSIS — K2980 Duodenitis without bleeding: Secondary | ICD-10-CM | POA: Insufficient documentation

## 2023-03-14 DIAGNOSIS — K3189 Other diseases of stomach and duodenum: Secondary | ICD-10-CM

## 2023-03-14 DIAGNOSIS — D490 Neoplasm of unspecified behavior of digestive system: Secondary | ICD-10-CM

## 2023-03-14 DIAGNOSIS — K219 Gastro-esophageal reflux disease without esophagitis: Secondary | ICD-10-CM | POA: Diagnosis not present

## 2023-03-14 DIAGNOSIS — Z85828 Personal history of other malignant neoplasm of skin: Secondary | ICD-10-CM | POA: Diagnosis not present

## 2023-03-14 DIAGNOSIS — I2510 Atherosclerotic heart disease of native coronary artery without angina pectoris: Secondary | ICD-10-CM

## 2023-03-14 DIAGNOSIS — I4891 Unspecified atrial fibrillation: Secondary | ICD-10-CM | POA: Insufficient documentation

## 2023-03-14 DIAGNOSIS — Z7984 Long term (current) use of oral hypoglycemic drugs: Secondary | ICD-10-CM | POA: Diagnosis not present

## 2023-03-14 DIAGNOSIS — J45909 Unspecified asthma, uncomplicated: Secondary | ICD-10-CM | POA: Insufficient documentation

## 2023-03-14 DIAGNOSIS — D3A8 Other benign neuroendocrine tumors: Secondary | ICD-10-CM

## 2023-03-14 DIAGNOSIS — E1151 Type 2 diabetes mellitus with diabetic peripheral angiopathy without gangrene: Secondary | ICD-10-CM | POA: Insufficient documentation

## 2023-03-14 DIAGNOSIS — Z7901 Long term (current) use of anticoagulants: Secondary | ICD-10-CM | POA: Insufficient documentation

## 2023-03-14 HISTORY — PX: BIOPSY: SHX5522

## 2023-03-14 HISTORY — PX: ESOPHAGOGASTRODUODENOSCOPY (EGD) WITH PROPOFOL: SHX5813

## 2023-03-14 LAB — GLUCOSE, CAPILLARY: Glucose-Capillary: 101 mg/dL — ABNORMAL HIGH (ref 70–99)

## 2023-03-14 SURGERY — ESOPHAGOGASTRODUODENOSCOPY (EGD) WITH PROPOFOL
Anesthesia: General

## 2023-03-14 MED ORDER — LIDOCAINE HCL (CARDIAC) PF 100 MG/5ML IV SOSY
PREFILLED_SYRINGE | INTRAVENOUS | Status: DC | PRN
  Administered 2023-03-14: 50 mg via INTRAVENOUS

## 2023-03-14 MED ORDER — STERILE WATER FOR IRRIGATION IR SOLN
Status: DC | PRN
  Administered 2023-03-14: 60 mL

## 2023-03-14 MED ORDER — LACTATED RINGERS IV SOLN: INTRAVENOUS | Status: DC | PRN

## 2023-03-14 MED ORDER — LACTATED RINGERS IV SOLN: INTRAVENOUS | Status: DC

## 2023-03-14 MED ORDER — PROPOFOL 10 MG/ML IV BOLUS
INTRAVENOUS | Status: DC | PRN
  Administered 2023-03-14: 100 mg via INTRAVENOUS
  Administered 2023-03-14: 40 mg via INTRAVENOUS

## 2023-03-14 MED ORDER — PROPOFOL 500 MG/50ML IV EMUL
INTRAVENOUS | Status: DC | PRN
  Administered 2023-03-14: 100 ug/kg/min via INTRAVENOUS

## 2023-03-14 NOTE — Transfer of Care (Signed)
Immediate Anesthesia Transfer of Care Note  Patient: Paul Dunn  Procedure(s) Performed: ESOPHAGOGASTRODUODENOSCOPY (EGD) WITH PROPOFOL BIOPSY  Patient Location: Short Stay  Anesthesia Type:General  Level of Consciousness: awake  Airway & Oxygen Therapy: Patient Spontanous Breathing  Post-op Assessment: Report given to RN  Post vital signs: Reviewed and stable  Last Vitals:  Vitals Value Taken Time  BP 117/59 03/14/23 1118  Temp 36.3 C 03/14/23 1118  Pulse 70 03/14/23 1118  Resp 13 03/14/23 1118  SpO2 99 % 03/14/23 1118    Last Pain:  Vitals:   03/14/23 1118  TempSrc: Axillary  PainSc:          Complications: No notable events documented.

## 2023-03-14 NOTE — Anesthesia Preprocedure Evaluation (Signed)
Anesthesia Evaluation  Patient identified by MRN, date of birth, ID band Patient awake    Reviewed: Allergy & Precautions, H&P , NPO status , Patient's Chart, lab work & pertinent test results, reviewed documented beta blocker date and time   Airway Mallampati: II  TM Distance: >3 FB Neck ROM: full    Dental no notable dental hx.    Pulmonary neg pulmonary ROS, asthma , former smoker   Pulmonary exam normal breath sounds clear to auscultation       Cardiovascular Exercise Tolerance: Good hypertension, + CAD and + Peripheral Vascular Disease  negative cardio ROS + dysrhythmias Atrial Fibrillation + pacemaker  Rhythm:regular Rate:Normal     Neuro/Psych negative neurological ROS  negative psych ROS   GI/Hepatic negative GI ROS, Neg liver ROS, hiatal hernia,GERD  ,,  Endo/Other  negative endocrine ROSdiabetes    Renal/GU Renal diseasenegative Renal ROS  negative genitourinary   Musculoskeletal   Abdominal   Peds  Hematology negative hematology ROS (+)   Anesthesia Other Findings   Reproductive/Obstetrics negative OB ROS                             Anesthesia Physical Anesthesia Plan  ASA: 3  Anesthesia Plan: General   Post-op Pain Management:    Induction:   PONV Risk Score and Plan: Propofol infusion  Airway Management Planned:   Additional Equipment:   Intra-op Plan:   Post-operative Plan:   Informed Consent: I have reviewed the patients History and Physical, chart, labs and discussed the procedure including the risks, benefits and alternatives for the proposed anesthesia with the patient or authorized representative who has indicated his/her understanding and acceptance.     Dental Advisory Given  Plan Discussed with: CRNA  Anesthesia Plan Comments:        Anesthesia Quick Evaluation

## 2023-03-14 NOTE — Anesthesia Postprocedure Evaluation (Signed)
Anesthesia Post Note  Patient: Paul Dunn  Procedure(s) Performed: ESOPHAGOGASTRODUODENOSCOPY (EGD) WITH PROPOFOL BIOPSY  Patient location during evaluation: Short Stay Anesthesia Type: General Level of consciousness: awake and alert Pain management: pain level controlled Vital Signs Assessment: post-procedure vital signs reviewed and stable Respiratory status: spontaneous breathing Cardiovascular status: stable and blood pressure returned to baseline Postop Assessment: no apparent nausea or vomiting Anesthetic complications: no   No notable events documented.   Last Vitals:  Vitals:   03/14/23 0930 03/14/23 1118  BP:  (!) 117/59  Pulse: 70 70  Resp: 11 13  Temp:  (!) 36.3 C  SpO2: 100% 99%    Last Pain:  Vitals:   03/14/23 1118  TempSrc: Axillary  PainSc:                  [name]

## 2023-03-14 NOTE — Op Note (Signed)
[facility] Patient Name: Paul Dunn Procedure Date: 03/14/2023 10:39 AM MRN: [id number] Date of Birth: 16-Oct-1950 Attending MD: Katrinka Blazing , , [id number] CSN: [id number] Age: 72 Admit Type: Outpatient Procedure:                Upper GI endoscopy Indications:              Tumor of the duodenum Providers:                Katrinka Blazing, Angelica Ran, Dyann Ruddle Referring MD:              Medicines:                Monitored Anesthesia Care Complications:            No immediate complications. Estimated Blood Loss:     Estimated blood loss: none. Procedure:                Pre-Anesthesia Assessment:                           - Prior to the procedure, a History and Physical                            was performed, and patient medications, allergies                            and sensitivities were reviewed. The patient's                            tolerance of previous anesthesia was reviewed.                           - The risks and benefits of the procedure and the                            sedation options and risks were discussed with the                            patient. All questions were answered and informed                            consent was obtained.                           - ASA Grade Assessment: III - A patient with severe                            systemic disease.                           - Adequate visualization was aided with the use of                            a transparent cap attached to the distal part of  the endoscope.                           After obtaining informed consent, the endoscope was                            passed under direct vision. Throughout the                            procedure, the patient's blood pressure, pulse, and                            oxygen saturations were monitored continuously. The                            [id number]-H190 ([id number]) scope was introduced through the                             mouth, and advanced to the second part of duodenum.                            The upper GI endoscopy was accomplished without                            difficulty. The patient tolerated the procedure                            well. Scope In: 11:02:35 AM Scope Out: 11:11:13 AM Total Procedure Duration: 0 hours 8 minutes 38 seconds  Findings:      The esophagus was normal.      The stomach was normal.      A single diminutive scar was found in the duodenal bulb. Biopsies were       taken with a cold forceps for histology.      The exam of the duodenum was otherwise normal. Impression:               - Normal esophagus.                           - Normal stomach.                           - A single scar in the duodenum. Biopsied. Moderate Sedation:      Per Anesthesia Care Recommendation:           - Discharge patient to home (ambulatory).                           - Resume previous diet.                           - Await pathology results.                           - Restart coumadin tonight.                           -  Check CgA levels today Procedure Code(s):        --- Professional ---                           [phone], Esophagogastroduodenoscopy, flexible,                            transoral; with biopsy, single or multiple Diagnosis Code(s):        --- Professional ---                           K31.89, Other diseases of stomach and duodenum                           D49.0, Neoplasm of unspecified behavior of                            digestive system CPT copyright 2022 American Medical Association. All rights reserved. The codes documented in this report are preliminary and upon coder review may  be revised to meet current compliance requirements. Katrinka Blazing, MD Katrinka Blazing,  03/14/2023 11:19:24 AM This report has been signed electronically. Number of Addenda: 0

## 2023-03-14 NOTE — H&P (Addendum)
Paul Dunn is an 72 y.o. male.   Chief Complaint: duodenal NET HPI:  Paul Dunn is a 72 y.o. 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   The patient denies having any nausea, vomiting, fever, chills, hematochezia, melena, hematemesis, abdominal distention, abdominal pain, diarrhea, jaundice, pruritus or weight loss.  Past Medical History:  Diagnosis Date   Asthma    "when I was a child"   Atrial fibrillation (HCC)    long standing persistent   Bulging of intervertebral disc between L4 and L5    Dysrhythmia    GERD (gastroesophageal reflux disease)    Gouty arthritis    H/O hiatal hernia    High cholesterol    History of blood transfusion ~ 1984   History of bronchitis    "used to get it twice/year; last time was ~ 2 yr ago" (04/12/2012)   HTN (hypertension)    Kidney cysts    "never treated it"   Macular degeneration    Peripheral vascular disease (HCC)    Presence of permanent cardiac pacemaker    Pulmonary nodule    Skin cancer    Tachycardia-bradycardia syndrome (HCC)    s/p PPM implant by Dr Johney Frame 04/12/12   Type 2 diabetes mellitus (HCC)     Past Surgical History:  Procedure Laterality Date   BIOPSY  01/18/2022   Procedure: BIOPSY;  Surgeon: Dolores Frame, MD;  Location: [facility];  Service: Gastroenterology;;   Earnstine Regal N/A 07/08/2022   Procedure: BIV PPM  UPGRADE;  Surgeon: Maurice Small, MD;  Location: [facility] INVASIVE CV LAB;  Service: Cardiovascular;  Laterality: N/A;   CARDIAC CATHETERIZATION N/A 06/23/2016   Procedure: Left Heart Cath and Coronary Angiography;  Surgeon: Yvonne Kendall, MD;  [facility] INVASIVE CV LAB;  Service: Cardiovascular;  Laterality: N/A;   CATARACT EXTRACTION, BILATERAL     COLONOSCOPY N/A 05/22/2019   Procedure: COLONOSCOPY;  Surgeon:  Malissa Hippo, MD;  Location: [facility];  Service: Endoscopy;  Laterality: N/A;  930   ESOPHAGEAL DILATION  01/2012   ESOPHAGOGASTRODUODENOSCOPY (EGD) WITH PROPOFOL N/A 01/01/2021   Procedure: ESOPHAGOGASTRODUODENOSCOPY (EGD) WITH PROPOFOL;  Surgeon: Dolores Frame, MD;  Location: [facility];  Service: Gastroenterology;  Laterality: N/A;  AM   ESOPHAGOGASTRODUODENOSCOPY (EGD) WITH PROPOFOL N/A 01/18/2022   Procedure: ESOPHAGOGASTRODUODENOSCOPY (EGD) WITH PROPOFOL;  Surgeon: Dolores Frame, MD;  Location: [facility];  Service: Gastroenterology;  Laterality: N/A;  935   HEMOSTASIS CLIP PLACEMENT  01/01/2021   Procedure: HEMOSTASIS CLIP PLACEMENT;  Surgeon: Dolores Frame, MD;  Location: [facility];  Service: Gastroenterology;;   LACERATION REPAIR  ~ 1984   left forearm; "cut it w/a power saw"   LEFT HEART CATH AND CORONARY ANGIOGRAPHY N/A 03/10/2022   Procedure: LEFT HEART CATH AND CORONARY ANGIOGRAPHY;  Surgeon: Kathleene Hazel, MD;  Location: [facility] INVASIVE CV LAB;  Service: Cardiovascular;  Laterality: N/A;   MELANOMA EXCISION  ~ 2011   left posterior shoulder   PACEMAKER INSERTION  04/12/2012   SJM [employer] DR RF pacemaker implanted by Dr Johney Frame for tachy/brady syndrome   PERMANENT PACEMAKER INSERTION N/A 04/12/2012   Procedure: PERMANENT PACEMAKER INSERTION;  Surgeon: Hillis Range, MD;  Location: [facility] CATH LAB;  Service: Cardiovascular;  Laterality: N/A;   POLYPECTOMY  05/22/2019   Procedure: POLYPECTOMY;  Surgeon: Malissa Hippo, MD;  Location: [facility];  Service: Endoscopy;;  transverse, cecum,ascending,    POLYPECTOMY  01/01/2021   Procedure: POLYPECTOMY;  Surgeon: Dolores Frame, MD;  Location: [facility];  Service: Gastroenterology;;  duodenal gastric    Family History  Problem Relation Age of Onset   Coronary artery disease Mother    Coronary artery disease Father    Ulcers Father    Lung cancer Father    Diabetes  Sister    Hypertension Sister    Rheum arthritis Sister    Hypertension Brother    Coronary artery disease Maternal Grandmother    Stroke Maternal Grandfather    Coronary artery disease Paternal Grandmother    Coronary artery disease Paternal Grandfather    Social History:  reports that he quit smoking about 31 years ago. His smoking use included cigarettes. He started smoking about 33 years ago. He has a 2.00 pack-year smoking history. He quit smokeless tobacco use about 26 years ago.  His smokeless tobacco use included chew. He reports that he does not drink alcohol and does not use drugs.  Allergies:  Allergies  Allergen Reactions   Shellfish Allergy Nausea And Vomiting    Shrimp   Sudafed [Pseudoephedrine] Other (See Comments)    Unknown reaction     Medications Prior to Admission  Medication Sig Dispense Refill   allopurinol (ZYLOPRIM) 300 MG tablet Take 300 mg by mouth in the morning.     Calcium Carb-Cholecalciferol 600-500 MG-UNIT CAPS Take 1 tablet by mouth in the morning.     carvedilol (COREG) 12.5 MG tablet Take 1 tablet (12.5 mg total) by mouth 2 (two) times daily. 180 tablet 3   Cholecalciferol (VITAMIN D) 125 MCG (5000 UT) CAPS Take 5,000 Units by mouth at bedtime.     clonazePAM (KLONOPIN) 1 MG tablet Take 1 mg by mouth at bedtime.     Cyanocobalamin (VITAMIN B 12 PO) Take 500 mcg by mouth daily with supper.     famotidine (PEPCID) 40 MG tablet TAKE ONE TABLET BY MOUTH AT BEDTIME 90 tablet 0   glipiZIDE (GLUCOTROL XL) 2.5 MG 24 hr tablet Take 2.5 mg by mouth in the morning.     metFORMIN (GLUCOPHAGE) 1000 MG tablet Take 1,000 mg by mouth 2 (two) times daily with a meal.     Multiple Vitamins-Minerals (PRESERVISION AREDS 2) CAPS Take 1 capsule by mouth in the morning and at bedtime.     olmesartan-hydrochlorothiazide (BENICAR HCT) 20-12.5 MG tablet Take 1 tablet by mouth in the morning.     Omega-3 Fatty Acids (FISH OIL) 1000 MG CAPS Take 1,000 mg by mouth in the  morning and at bedtime.     omeprazole (PRILOSEC) 40 MG capsule Take one po daily. 90 capsule 3   rosuvastatin (CRESTOR) 5 MG tablet TAKE 1 TABLET EVERY MONDAY, WEDNESDAY, &FRIDAY (Patient taking differently: Take 5 mg by mouth every Monday, Wednesday, and Friday at 8 PM.) 38 tablet 2   Zinc 50 MG TABS Take 50 mg by mouth in the morning.     Carboxymeth-Glyc-Polysorb PF (REFRESH OPTIVE MEGA-3) 0.5-1-0.5 % SOLN Place 1 drop into both eyes in the morning.     warfarin (COUMADIN) 5 MG tablet Take 5-7.5 mg by mouth See admin instructions. Take 1 tablet (5 mg) by mouth on Sundays, Tuesdays, Wednesday and Friday  in the evening. Take 1.5 tablets (7.5 mg) by mouth on Monday,Thursday & Saturday in the evening.      Results for orders placed or performed  during the hospital encounter of 03/14/23 (from the past 48 hour(s))  Glucose, capillary     Status: Abnormal   Collection Time: 03/14/23  9:10 AM  Result Value Ref Range   Glucose-Capillary 101 (H) 70 - 99 mg/dL    Comment: Glucose reference range applies only to samples taken after fasting for at least 8 hours.   No results found.  Review of Systems  All other systems reviewed and are negative.   Blood pressure (!) 141/85, pulse 70, temperature (!) 97.5 F (36.4 C), resp. rate 11, SpO2 100 %. Physical Exam  GENERAL: The patient is AO x3, in no acute distress. HEENT: Head is normocephalic and atraumatic. EOMI are intact. Mouth is well hydrated and without lesions. NECK: Supple. No masses LUNGS: Clear to auscultation. No presence of rhonchi/wheezing/rales. Adequate chest expansion HEART: RRR, normal s1 and s2. ABDOMEN: Soft, nontender, no guarding, no peritoneal signs, and nondistended. BS +. No masses. EXTREMITIES: Without any cyanosis, clubbing, rash, lesions or edema. NEUROLOGIC: AOx3, no focal motor deficit. SKIN: no jaundice, no rashes  Assessment/Plan  Paul Dunn is a 72 y.o. 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  Will proceed with EGD.  Dolores Frame, MD 03/14/2023, 10:41 AM

## 2023-03-14 NOTE — Anesthesia Procedure Notes (Signed)
Date/Time: 03/14/2023 10:51 AM  Performed by: Julian Reil, CRNAPre-anesthesia Checklist: Patient identified, Emergency Drugs available, Suction available and Patient being monitored Patient Re-evaluated:Patient Re-evaluated prior to induction Oxygen Delivery Method: Nasal cannula Induction Type: IV induction Placement Confirmation: positive ETCO2

## 2023-03-14 NOTE — Discharge Instructions (Signed)
You are being discharged to home.  Resume your previous diet.  We are waiting for your pathology results.  Restart coumadin tonight.

## 2023-03-15 LAB — SURGICAL PATHOLOGY

## 2023-03-16 LAB — CHROMOGRANIN A: Chromogranin A (ng/mL): 392.8 ng/mL — ABNORMAL HIGH (ref 0.0–101.8)

## 2023-03-17 ENCOUNTER — Other Ambulatory Visit (INDEPENDENT_AMBULATORY_CARE_PROVIDER_SITE_OTHER): Payer: Self-pay | Admitting: Gastroenterology

## 2023-03-17 DIAGNOSIS — E340 Carcinoid syndrome: Secondary | ICD-10-CM

## 2023-03-17 DIAGNOSIS — Z85060 Personal history of malignant carcinoid tumor of small intestine: Secondary | ICD-10-CM

## 2023-03-20 ENCOUNTER — Encounter (INDEPENDENT_AMBULATORY_CARE_PROVIDER_SITE_OTHER): Payer: Self-pay

## 2023-03-20 ENCOUNTER — Encounter (INDEPENDENT_AMBULATORY_CARE_PROVIDER_SITE_OTHER): Payer: Self-pay | Admitting: *Deleted

## 2023-03-21 ENCOUNTER — Encounter (HOSPITAL_COMMUNITY): Payer: Self-pay | Admitting: Gastroenterology

## 2023-03-31 ENCOUNTER — Other Ambulatory Visit: Payer: Self-pay | Admitting: Student

## 2023-03-31 DIAGNOSIS — I4891 Unspecified atrial fibrillation: Secondary | ICD-10-CM | POA: Diagnosis not present

## 2023-03-31 NOTE — Telephone Encounter (Signed)
This is a [place] pt.  °

## 2023-04-03 ENCOUNTER — Encounter (HOSPITAL_COMMUNITY)
Admission: RE | Admit: 2023-04-03 | Discharge: 2023-04-03 | Disposition: A | Discharge status: Home / Self Care | Payer: Medicare Other | Source: Ambulatory Visit | Attending: Gastroenterology | Admitting: Gastroenterology

## 2023-04-03 DIAGNOSIS — Z85060 Personal history of malignant carcinoid tumor of small intestine: Secondary | ICD-10-CM | POA: Diagnosis not present

## 2023-04-03 DIAGNOSIS — C7A010 Malignant carcinoid tumor of the duodenum: Secondary | ICD-10-CM | POA: Diagnosis not present

## 2023-04-03 DIAGNOSIS — E340 Carcinoid syndrome: Secondary | ICD-10-CM | POA: Insufficient documentation

## 2023-04-03 DIAGNOSIS — I700 Atherosclerosis of aorta: Secondary | ICD-10-CM | POA: Diagnosis not present

## 2023-04-03 DIAGNOSIS — C170 Malignant neoplasm of duodenum: Secondary | ICD-10-CM | POA: Diagnosis not present

## 2023-04-03 MED ORDER — COPPER CU 64 DOTATATE 1 MCI/ML IV SOLN
4.0000 | Freq: Once | INTRAVENOUS | Status: AC
  Administered 2023-04-03: 4.3 via INTRAVENOUS

## 2023-04-10 ENCOUNTER — Ambulatory Visit: Payer: Medicare Other

## 2023-04-10 DIAGNOSIS — I495 Sick sinus syndrome: Secondary | ICD-10-CM

## 2023-04-10 LAB — CUP PACEART REMOTE DEVICE CHECK
Battery Remaining Longevity: 98 mo
Battery Remaining Percentage: 95 %
Battery Voltage: 3.01 V
Date Time Interrogation Session: 20240729020008
Implantable Lead Connection Status: 753985
Implantable Lead Connection Status: 753985
Implantable Lead Connection Status: 753985
Implantable Lead Implant Date: 20130801
Implantable Lead Implant Date: 20130801
Implantable Lead Implant Date: 20231027
Implantable Lead Location: 753858
Implantable Lead Location: 753859
Implantable Lead Location: 753860
Implantable Lead Model: 1948
Implantable Pulse Generator Implant Date: 20231027
Lead Channel Impedance Value: 530 Ohm
Lead Channel Impedance Value: 830 Ohm
Lead Channel Pacing Threshold Amplitude: 1 V
Lead Channel Pacing Threshold Amplitude: 1.25 V
Lead Channel Pacing Threshold Pulse Width: 0.5 ms
Lead Channel Pacing Threshold Pulse Width: 0.5 ms
Lead Channel Sensing Intrinsic Amplitude: 8.1 mV
Lead Channel Setting Pacing Amplitude: 2 V
Lead Channel Setting Pacing Amplitude: 2.25 V
Lead Channel Setting Pacing Pulse Width: 0.5 ms
Lead Channel Setting Pacing Pulse Width: 0.5 ms
Lead Channel Setting Sensing Sensitivity: 2 mV
Pulse Gen Model: 3562
Pulse Gen Serial Number: 8114542

## 2023-04-22 NOTE — Progress Notes (Signed)
There is no indication of active cancer in this imaging

## 2023-04-25 NOTE — Progress Notes (Signed)
Remote pacemaker transmission.   

## 2023-05-23 DIAGNOSIS — X32XXXD Exposure to sunlight, subsequent encounter: Secondary | ICD-10-CM | POA: Diagnosis not present

## 2023-05-23 DIAGNOSIS — L570 Actinic keratosis: Secondary | ICD-10-CM | POA: Diagnosis not present

## 2023-05-24 DIAGNOSIS — Z0000 Encounter for general adult medical examination without abnormal findings: Secondary | ICD-10-CM | POA: Diagnosis not present

## 2023-05-24 DIAGNOSIS — E782 Mixed hyperlipidemia: Secondary | ICD-10-CM | POA: Diagnosis not present

## 2023-05-24 DIAGNOSIS — I4891 Unspecified atrial fibrillation: Secondary | ICD-10-CM | POA: Diagnosis not present

## 2023-05-24 DIAGNOSIS — Z1331 Encounter for screening for depression: Secondary | ICD-10-CM | POA: Diagnosis not present

## 2023-05-24 DIAGNOSIS — K2100 Gastro-esophageal reflux disease with esophagitis, without bleeding: Secondary | ICD-10-CM | POA: Diagnosis not present

## 2023-05-24 DIAGNOSIS — M1009 Idiopathic gout, multiple sites: Secondary | ICD-10-CM | POA: Diagnosis not present

## 2023-05-24 DIAGNOSIS — I10 Essential (primary) hypertension: Secondary | ICD-10-CM | POA: Diagnosis not present

## 2023-05-24 DIAGNOSIS — E1142 Type 2 diabetes mellitus with diabetic polyneuropathy: Secondary | ICD-10-CM | POA: Diagnosis not present

## 2023-05-24 DIAGNOSIS — Z125 Encounter for screening for malignant neoplasm of prostate: Secondary | ICD-10-CM | POA: Diagnosis not present

## 2023-05-29 ENCOUNTER — Encounter (INDEPENDENT_AMBULATORY_CARE_PROVIDER_SITE_OTHER): Payer: Medicare Other | Admitting: Ophthalmology

## 2023-05-29 DIAGNOSIS — H353121 Nonexudative age-related macular degeneration, left eye, early dry stage: Secondary | ICD-10-CM | POA: Diagnosis not present

## 2023-05-29 DIAGNOSIS — H353112 Nonexudative age-related macular degeneration, right eye, intermediate dry stage: Secondary | ICD-10-CM | POA: Diagnosis not present

## 2023-05-29 DIAGNOSIS — I10 Essential (primary) hypertension: Secondary | ICD-10-CM

## 2023-05-29 DIAGNOSIS — H43813 Vitreous degeneration, bilateral: Secondary | ICD-10-CM | POA: Diagnosis not present

## 2023-05-29 DIAGNOSIS — H35033 Hypertensive retinopathy, bilateral: Secondary | ICD-10-CM

## 2023-05-31 ENCOUNTER — Encounter: Payer: Self-pay | Admitting: Cardiovascular Disease

## 2023-07-06 DIAGNOSIS — Z23 Encounter for immunization: Secondary | ICD-10-CM | POA: Diagnosis not present

## 2023-07-10 ENCOUNTER — Ambulatory Visit (INDEPENDENT_AMBULATORY_CARE_PROVIDER_SITE_OTHER): Payer: Medicare Other

## 2023-07-10 DIAGNOSIS — I495 Sick sinus syndrome: Secondary | ICD-10-CM

## 2023-07-11 LAB — CUP PACEART REMOTE DEVICE CHECK
Battery Remaining Longevity: 94 mo
Battery Remaining Percentage: 92 %
Battery Voltage: 3.01 V
Date Time Interrogation Session: 20241028020009
Implantable Lead Connection Status: 753985
Implantable Lead Connection Status: 753985
Implantable Lead Connection Status: 753985
Implantable Lead Implant Date: 20130801
Implantable Lead Implant Date: 20130801
Implantable Lead Implant Date: 20231027
Implantable Lead Location: 753858
Implantable Lead Location: 753859
Implantable Lead Location: 753860
Implantable Lead Model: 1948
Implantable Pulse Generator Implant Date: 20231027
Lead Channel Impedance Value: 530 Ohm
Lead Channel Impedance Value: 760 Ohm
Lead Channel Pacing Threshold Amplitude: 1.125 V
Lead Channel Pacing Threshold Amplitude: 1.375 V
Lead Channel Pacing Threshold Pulse Width: 0.5 ms
Lead Channel Pacing Threshold Pulse Width: 0.5 ms
Lead Channel Sensing Intrinsic Amplitude: 6 mV
Lead Channel Setting Pacing Amplitude: 2.125
Lead Channel Setting Pacing Amplitude: 2.375
Lead Channel Setting Pacing Pulse Width: 0.5 ms
Lead Channel Setting Pacing Pulse Width: 0.5 ms
Lead Channel Setting Sensing Sensitivity: 2 mV
Pulse Gen Model: 3562
Pulse Gen Serial Number: 8114542

## 2023-07-26 NOTE — Progress Notes (Signed)
Remote pacemaker transmission.   

## 2023-07-28 DIAGNOSIS — Z7901 Long term (current) use of anticoagulants: Secondary | ICD-10-CM | POA: Diagnosis not present

## 2023-08-23 DIAGNOSIS — E782 Mixed hyperlipidemia: Secondary | ICD-10-CM | POA: Diagnosis not present

## 2023-08-23 DIAGNOSIS — M1009 Idiopathic gout, multiple sites: Secondary | ICD-10-CM | POA: Diagnosis not present

## 2023-08-23 DIAGNOSIS — E1142 Type 2 diabetes mellitus with diabetic polyneuropathy: Secondary | ICD-10-CM | POA: Diagnosis not present

## 2023-08-23 DIAGNOSIS — I4891 Unspecified atrial fibrillation: Secondary | ICD-10-CM | POA: Diagnosis not present

## 2023-08-23 DIAGNOSIS — K2100 Gastro-esophageal reflux disease with esophagitis, without bleeding: Secondary | ICD-10-CM | POA: Diagnosis not present

## 2023-08-23 DIAGNOSIS — I10 Essential (primary) hypertension: Secondary | ICD-10-CM | POA: Diagnosis not present

## 2023-08-23 DIAGNOSIS — I480 Paroxysmal atrial fibrillation: Secondary | ICD-10-CM | POA: Diagnosis not present

## 2023-09-19 DIAGNOSIS — L570 Actinic keratosis: Secondary | ICD-10-CM | POA: Diagnosis not present

## 2023-09-19 DIAGNOSIS — D225 Melanocytic nevi of trunk: Secondary | ICD-10-CM | POA: Diagnosis not present

## 2023-09-19 DIAGNOSIS — X32XXXD Exposure to sunlight, subsequent encounter: Secondary | ICD-10-CM | POA: Diagnosis not present

## 2023-09-19 DIAGNOSIS — Z1283 Encounter for screening for malignant neoplasm of skin: Secondary | ICD-10-CM | POA: Diagnosis not present

## 2023-10-09 ENCOUNTER — Encounter: Payer: Self-pay | Admitting: Cardiovascular Disease

## 2023-10-09 ENCOUNTER — Ambulatory Visit (INDEPENDENT_AMBULATORY_CARE_PROVIDER_SITE_OTHER): Payer: Medicare Other

## 2023-10-09 DIAGNOSIS — I495 Sick sinus syndrome: Secondary | ICD-10-CM | POA: Diagnosis not present

## 2023-10-09 LAB — CUP PACEART REMOTE DEVICE CHECK
Battery Remaining Longevity: 92 mo
Battery Remaining Percentage: 90 %
Battery Voltage: 3.01 V
Date Time Interrogation Session: 20250127020008
Implantable Lead Connection Status: 753985
Implantable Lead Connection Status: 753985
Implantable Lead Connection Status: 753985
Implantable Lead Implant Date: 20130801
Implantable Lead Implant Date: 20130801
Implantable Lead Implant Date: 20231027
Implantable Lead Location: 753858
Implantable Lead Location: 753859
Implantable Lead Location: 753860
Implantable Lead Model: 1948
Implantable Pulse Generator Implant Date: 20231027
Lead Channel Impedance Value: 540 Ohm
Lead Channel Impedance Value: 830 Ohm
Lead Channel Pacing Threshold Amplitude: 1 V
Lead Channel Pacing Threshold Amplitude: 1.125 V
Lead Channel Pacing Threshold Pulse Width: 0.5 ms
Lead Channel Pacing Threshold Pulse Width: 0.5 ms
Lead Channel Sensing Intrinsic Amplitude: 10.6 mV
Lead Channel Setting Pacing Amplitude: 2 V
Lead Channel Setting Pacing Amplitude: 2.125
Lead Channel Setting Pacing Pulse Width: 0.5 ms
Lead Channel Setting Pacing Pulse Width: 0.5 ms
Lead Channel Setting Sensing Sensitivity: 2 mV
Pulse Gen Model: 3562
Pulse Gen Serial Number: 8114542

## 2023-10-20 ENCOUNTER — Encounter: Payer: Medicare Other | Admitting: Cardiovascular Disease

## 2023-10-20 DIAGNOSIS — I480 Paroxysmal atrial fibrillation: Secondary | ICD-10-CM | POA: Diagnosis not present

## 2023-10-27 ENCOUNTER — Encounter: Payer: Self-pay | Admitting: Cardiovascular Disease

## 2023-10-27 ENCOUNTER — Ambulatory Visit: Payer: Medicare Other | Attending: Cardiovascular Disease | Admitting: Cardiovascular Disease

## 2023-10-27 VITALS — BP 128/80 | HR 77 | Ht 71.0 in | Wt 193.8 lb

## 2023-10-27 DIAGNOSIS — I4821 Permanent atrial fibrillation: Secondary | ICD-10-CM | POA: Diagnosis not present

## 2023-10-27 DIAGNOSIS — I10 Essential (primary) hypertension: Secondary | ICD-10-CM | POA: Diagnosis not present

## 2023-10-27 DIAGNOSIS — I495 Sick sinus syndrome: Secondary | ICD-10-CM | POA: Diagnosis not present

## 2023-10-27 LAB — CUP PACEART INCLINIC DEVICE CHECK
Battery Remaining Longevity: 88 mo
Battery Voltage: 3.01 V
Brady Statistic RA Percent Paced: 0 %
Brady Statistic RV Percent Paced: 99.66 %
Date Time Interrogation Session: 20250214095704
Implantable Lead Connection Status: 753985
Implantable Lead Connection Status: 753985
Implantable Lead Connection Status: 753985
Implantable Lead Implant Date: 20130801
Implantable Lead Implant Date: 20130801
Implantable Lead Implant Date: 20231027
Implantable Lead Location: 753858
Implantable Lead Location: 753859
Implantable Lead Location: 753860
Implantable Lead Model: 1948
Implantable Pulse Generator Implant Date: 20231027
Lead Channel Impedance Value: 537.5 Ohm
Lead Channel Impedance Value: 837.5 Ohm
Lead Channel Pacing Threshold Amplitude: 0.875 V
Lead Channel Pacing Threshold Amplitude: 1 V
Lead Channel Pacing Threshold Pulse Width: 0.5 ms
Lead Channel Pacing Threshold Pulse Width: 0.5 ms
Lead Channel Sensing Intrinsic Amplitude: 10.8 mV
Lead Channel Setting Pacing Amplitude: 2 V
Lead Channel Setting Pacing Amplitude: 2 V
Lead Channel Setting Pacing Pulse Width: 0.5 ms
Lead Channel Setting Pacing Pulse Width: 0.5 ms
Lead Channel Setting Sensing Sensitivity: 2 mV
Pulse Gen Model: 3562
Pulse Gen Serial Number: 8114542

## 2023-10-27 NOTE — Patient Instructions (Signed)

## 2023-10-27 NOTE — Progress Notes (Signed)
    Electrophysiology Office Note Date: 10/27/2023  ID:  Paul Dunn, DOB [birth year], MRN [id number]  PCP: Toma Deiters, MD Primary Cardiologist: Charlton Haws, MD Electrophysiologist: Maurice Small, MD   CC: Pacemaker follow-up  Paul Dunn is a 73 y.o. male seen today for Maurice Small, MD for EP follow up.    Seen in ED 05/20/2022 with atypical chest pain. Labs were unrermarkable. Phone note 9/8 shows approx 13s/45-50 beats of likely VT/ dual tachycardia.He was seen in EP clinic is [facility] 05/25/2022 and reported that he had no events since his ED discharge. His diltiazem was changed to metoprolol, and his VT zone was increased to > 30 seconds.  Cath 02/2022 with mostly Moderate + CAD with 1 borderline OM lesion. Medical therapy recommended unless symptoms worsened or changed.   Feb 2023 He underwent upgrade to a BiV ICD with placement of a LV lead due to decline in EF to 40% with a a high burden of RV pacing. He reports that he feels better, and his dependent edema as resolved.    Physical Exam:   Gen: Appears comfortable, well-nourished CV: RRR, no dependent edema The device site is normal -- no tenderness, edema, drainage, redness, threatened erosion. Pulm: breathing easily   PPM Interrogation-  reviewed in detail today,  See PACEART report.  EKG:  EKG is not ordered today. Personal review of ekg ordered  9/8  shows V pacing at 60 bpm   Recent Labs: 03/10/2023: BUN 22; Creatinine, Ser 1.18; Potassium 4.5; Sodium 137   Wt Readings from Last 3 Encounters:  10/27/23 193 lb 12.8 oz (87.9 kg)  03/10/23 190 lb (86.2 kg)  02/13/23 190 lb (86.2 kg)     Other studies Reviewed: Additional studies/ records that were reviewed today include: Previous EP office notes, Previous remote checks, Most recent labwork.   LHC 02/2022 The LAD has a calcified moderate proximal stenosis and diffuse moderate disease in the mid vessel. No change from last cath The  Circumflex has diffuse moderate mid stenosis. Small to moderate caliber obtuse marginal branch with moderate to severe proximal stenosis.  The RCA is a large dominant, heavily calcified vessel. The right posterolateral artery has diffuse moderate stenosis, unchanged from last cath.    TTE 06/27/2022 Septal and apical akinesis. Left ventricular ejection fraction, by estimation, is 40 to 45%. The left ventricle has mildly decreased function. The left ventricle demonstrates regional wall motion abnormalities (see scoring diagram/findings for description).  Left atrium is severely dilated.  TTE 02/02/2023 EF 55-60%  Assessment and Plan:  1. 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 Cardiomyopathy: ischemic, possibly exacerbated by RV pacing. Device now with CRT function EF has normalized  3. 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  Consider mexitil vs amio if recurs  6. HTN Stable on current regimen    7. CAD Denies s/s ischemia Myoview 02/2022 with EF ~45% and prior infarct, no ischemia.  Cath 03/10/2022 with moderate diffuse disease as above with borderline OM lesion.   7. HL Continue crestor 5mg  daily  Current medicines are reviewed at length with the patient today.     Signed, Maurice Small, MD  10/27/2023 9:30 AM

## 2023-11-15 NOTE — Progress Notes (Signed)
 Remote pacemaker transmission.

## 2023-11-23 DIAGNOSIS — K2100 Gastro-esophageal reflux disease with esophagitis, without bleeding: Secondary | ICD-10-CM | POA: Diagnosis not present

## 2023-11-23 DIAGNOSIS — E782 Mixed hyperlipidemia: Secondary | ICD-10-CM | POA: Diagnosis not present

## 2023-11-23 DIAGNOSIS — I10 Essential (primary) hypertension: Secondary | ICD-10-CM | POA: Diagnosis not present

## 2023-11-23 DIAGNOSIS — E1142 Type 2 diabetes mellitus with diabetic polyneuropathy: Secondary | ICD-10-CM | POA: Diagnosis not present

## 2023-11-23 DIAGNOSIS — I480 Paroxysmal atrial fibrillation: Secondary | ICD-10-CM | POA: Diagnosis not present

## 2023-11-23 DIAGNOSIS — Z0000 Encounter for general adult medical examination without abnormal findings: Secondary | ICD-10-CM | POA: Diagnosis not present

## 2024-01-08 ENCOUNTER — Ambulatory Visit (INDEPENDENT_AMBULATORY_CARE_PROVIDER_SITE_OTHER): Payer: Medicare Other

## 2024-01-08 DIAGNOSIS — I495 Sick sinus syndrome: Secondary | ICD-10-CM

## 2024-01-08 LAB — CUP PACEART REMOTE DEVICE CHECK
Battery Remaining Longevity: 89 mo
Battery Remaining Percentage: 87 %
Battery Voltage: 2.99 V
Date Time Interrogation Session: 20250428020011
Implantable Lead Connection Status: 753985
Implantable Lead Connection Status: 753985
Implantable Lead Connection Status: 753985
Implantable Lead Implant Date: 20130801
Implantable Lead Implant Date: 20130801
Implantable Lead Implant Date: 20231027
Implantable Lead Location: 753858
Implantable Lead Location: 753859
Implantable Lead Location: 753860
Implantable Lead Model: 1948
Implantable Pulse Generator Implant Date: 20231027
Lead Channel Impedance Value: 530 Ohm
Lead Channel Impedance Value: 830 Ohm
Lead Channel Pacing Threshold Amplitude: 1 V
Lead Channel Pacing Threshold Amplitude: 1 V
Lead Channel Pacing Threshold Pulse Width: 0.5 ms
Lead Channel Pacing Threshold Pulse Width: 0.5 ms
Lead Channel Sensing Intrinsic Amplitude: 12 mV
Lead Channel Setting Pacing Amplitude: 2 V
Lead Channel Setting Pacing Amplitude: 2 V
Lead Channel Setting Pacing Pulse Width: 0.5 ms
Lead Channel Setting Pacing Pulse Width: 0.5 ms
Lead Channel Setting Sensing Sensitivity: 2 mV
Pulse Gen Model: 3562
Pulse Gen Serial Number: 8114542

## 2024-01-16 DIAGNOSIS — L570 Actinic keratosis: Secondary | ICD-10-CM | POA: Diagnosis not present

## 2024-01-16 DIAGNOSIS — X32XXXD Exposure to sunlight, subsequent encounter: Secondary | ICD-10-CM | POA: Diagnosis not present

## 2024-01-18 DIAGNOSIS — H5203 Hypermetropia, bilateral: Secondary | ICD-10-CM | POA: Diagnosis not present

## 2024-01-22 ENCOUNTER — Encounter: Payer: Self-pay | Admitting: Cardiovascular Disease

## 2024-02-22 DIAGNOSIS — I480 Paroxysmal atrial fibrillation: Secondary | ICD-10-CM | POA: Diagnosis not present

## 2024-02-22 DIAGNOSIS — E782 Mixed hyperlipidemia: Secondary | ICD-10-CM | POA: Diagnosis not present

## 2024-02-22 DIAGNOSIS — M1009 Idiopathic gout, multiple sites: Secondary | ICD-10-CM | POA: Diagnosis not present

## 2024-02-22 DIAGNOSIS — E1142 Type 2 diabetes mellitus with diabetic polyneuropathy: Secondary | ICD-10-CM | POA: Diagnosis not present

## 2024-02-22 DIAGNOSIS — I10 Essential (primary) hypertension: Secondary | ICD-10-CM | POA: Diagnosis not present

## 2024-02-22 NOTE — Progress Notes (Signed)
 Remote pacemaker transmission.

## 2024-03-01 DIAGNOSIS — I739 Peripheral vascular disease, unspecified: Secondary | ICD-10-CM | POA: Diagnosis not present

## 2024-03-20 ENCOUNTER — Other Ambulatory Visit: Payer: Self-pay | Admitting: Cardiovascular Disease

## 2024-03-22 DIAGNOSIS — I480 Paroxysmal atrial fibrillation: Secondary | ICD-10-CM | POA: Diagnosis not present

## 2024-04-08 ENCOUNTER — Ambulatory Visit (INDEPENDENT_AMBULATORY_CARE_PROVIDER_SITE_OTHER): Payer: Medicare Other

## 2024-04-08 DIAGNOSIS — I495 Sick sinus syndrome: Secondary | ICD-10-CM

## 2024-04-09 LAB — CUP PACEART REMOTE DEVICE CHECK
Battery Remaining Longevity: 86 mo
Battery Remaining Percentage: 84 %
Battery Voltage: 2.99 V
Date Time Interrogation Session: 20250728025225
Implantable Lead Connection Status: 753985
Implantable Lead Connection Status: 753985
Implantable Lead Connection Status: 753985
Implantable Lead Implant Date: 20130801
Implantable Lead Implant Date: 20130801
Implantable Lead Implant Date: 20231027
Implantable Lead Location: 753858
Implantable Lead Location: 753859
Implantable Lead Location: 753860
Implantable Lead Model: 1948
Implantable Pulse Generator Implant Date: 20231027
Lead Channel Impedance Value: 540 Ohm
Lead Channel Impedance Value: 830 Ohm
Lead Channel Pacing Threshold Amplitude: 1.125 V
Lead Channel Pacing Threshold Amplitude: 1.375 V
Lead Channel Pacing Threshold Pulse Width: 0.5 ms
Lead Channel Pacing Threshold Pulse Width: 0.5 ms
Lead Channel Sensing Intrinsic Amplitude: 11 mV
Lead Channel Setting Pacing Amplitude: 2.125
Lead Channel Setting Pacing Amplitude: 2.375
Lead Channel Setting Pacing Pulse Width: 0.5 ms
Lead Channel Setting Pacing Pulse Width: 0.5 ms
Lead Channel Setting Sensing Sensitivity: 2 mV
Pulse Gen Model: 3562
Pulse Gen Serial Number: 8114542

## 2024-04-22 ENCOUNTER — Ambulatory Visit: Payer: Self-pay | Admitting: Cardiovascular Disease

## 2024-04-23 ENCOUNTER — Encounter (INDEPENDENT_AMBULATORY_CARE_PROVIDER_SITE_OTHER): Payer: Self-pay | Admitting: *Deleted

## 2024-04-26 DIAGNOSIS — I480 Paroxysmal atrial fibrillation: Secondary | ICD-10-CM | POA: Diagnosis not present

## 2024-05-08 ENCOUNTER — Telehealth (INDEPENDENT_AMBULATORY_CARE_PROVIDER_SITE_OTHER): Payer: Self-pay

## 2024-05-08 NOTE — Telephone Encounter (Signed)
 Room 3, needs clearance to hold warfarin - recommendations for bridging Thanks

## 2024-05-08 NOTE — Telephone Encounter (Signed)
    05/08/24  Beryl JONETTA Sink 1950-10-30  What type of surgery is being performed? colonoscopy  When is surgery scheduled? TBD  What type of clearance is required (medical or pharmacy to hold medication or both? medication  Are there any medications that need to be held prior to surgery and how long? Warfarin x 5 days prior, does patient need lovenox bridge? If so what are the recommendations?  Name of physician performing surgery?  Dr. Eartha Rouse [facility] Phone: [phone] Fax: [phone]  Anethesia type (none, local, MAC, general)? MAC     ? Yes ? No Patient can hold medication as requested   Signature: ___________________________

## 2024-05-08 NOTE — Telephone Encounter (Signed)
 Who is your primary care physician: Dr. Orpha  Have you had a colonoscopy before?  Yes, 05/22/2019  Do you have family history of colon cancer? no  Previous colonoscopy with polyps removed? yes  Do you have a history colorectal cancer?   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 no  Do you have a pacemaker/defibrillator?   yes  Have you had endocarditis/atrial fibrillation? yes  Have you had joint replacement within the last 12 months?  no  Do you tend to be constipated or have to use laxatives? no  Do you have any history of drugs or alchohol?  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  no  Have you had a stroke or heart attack within the last 6 months? no  Do you take weight loss medication?  No  Do you take any blood-thinning medications such as: (aspirin , warfarin, Plavix, Aggrenox)  yes  If yes we need the name, milligram, dosage and who is prescribing doctor warfarin, Dr. Orpha Current Outpatient Medications on File Prior to Visit  Medication Sig Dispense Refill   allopurinol  (ZYLOPRIM ) 300 MG tablet Take 300 mg by mouth in the morning.     Calcium  Carb-Cholecalciferol 600-500 MG-UNIT CAPS Take 1 tablet by mouth in the morning.     Carboxymeth-Glyc-Polysorb PF (REFRESH OPTIVE MEGA-3) 0.5-1-0.5 % SOLN Place 1 drop into both eyes in the morning.     carvedilol  (COREG ) 12.5 MG tablet TAKE ONE (1) TABLET BY MOUTH TWO (2) TIMES DAILY 180 tablet 3   Cholecalciferol (VITAMIN D) 125 MCG (5000 UT) CAPS Take 5,000 Units by mouth at bedtime.     clonazePAM (KLONOPIN) 1 MG tablet Take 1 mg by mouth at bedtime.     Cyanocobalamin (VITAMIN B 12 PO) Take 500 mcg by mouth daily with supper.     famotidine  (PEPCID ) 40 MG tablet TAKE ONE TABLET BY MOUTH AT BEDTIME 90 tablet 0   glipiZIDE  (GLUCOTROL  XL) 2.5 MG 24 hr tablet Take 2.5 mg by mouth in the morning.     metFORMIN   (GLUCOPHAGE ) 1000 MG tablet Take 1,000 mg by mouth 2 (two) times daily with a meal.     Multiple Vitamins-Minerals (PRESERVISION AREDS 2) CAPS Take 1 capsule by mouth in the morning and at bedtime.     olmesartan -hydrochlorothiazide (BENICAR  HCT) 20-12.5 MG tablet Take 1 tablet by mouth in the morning.     Omega-3 Fatty Acids (FISH OIL) 1000 MG CAPS Take 1,000 mg by mouth in the morning and at bedtime.     omeprazole  (PRILOSEC) 40 MG capsule Take one po daily. 90 capsule 3   rosuvastatin  (CRESTOR ) 5 MG tablet TAKE 1 TABLET EVERY MONDAY, WEDNESDAY, &FRIDAY (Patient taking differently: Take 5 mg by mouth every Monday, Wednesday, and Friday at 8 PM.) 38 tablet 2   warfarin (COUMADIN ) 5 MG tablet Take 5-7.5 mg by mouth See admin instructions. Take 1 tablet (5 mg) by mouth on Sundays, Tuesdays, Wednesday and Friday  in the evening. Take 1.5 tablets (7.5 mg) by mouth on Monday,Thursday & Saturday in the evening.     Zinc 50 MG TABS Take 50 mg by mouth in the morning.     No current facility-administered medications on file prior to visit.    Allergies  Allergen Reactions   Shellfish Allergy Nausea And Vomiting    Shrimp  Sudafed [Pseudoephedrine] Other (See Comments)    Unknown reaction      Pharmacy: [facility] [employer] number where you can be reached: [phone]

## 2024-05-08 NOTE — Telephone Encounter (Signed)
Sent clearance to PCP

## 2024-05-12 NOTE — Progress Notes (Unsigned)
 Cardiology Office Note    Date:  05/15/2024  ID:  Paul Dunn, DOB [birth year], MRN [id number] Cardiologist: Maude Emmer, MD Cardiology APP:  Johnson Laymon HERO, PA-C  Electrophysiologist:  Eulas FORBES Furbish, MD { : History of Present Illness:    Paul Dunn is a 73 y.o. male with past medical history of persistent atrial fibrillation, symptomatic bradycardia (s/p St. 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   He was examined by Dr. Emmer in 02/2023 and recent echocardiogram had shown that his EF had improved to 55 to 60%. He denied any recent anginal symptoms and lower extremity edema had improved. No changes were made to his cardiac medications at that time. He did follow-up with Dr. Furbish in 10/2023 and device was functioning normally. He was continued on Coreg  12.5 mg twice daily, Olmesartan -HCTZ 20-12.5 mg daily, Crestor  5 mg MWF and Coumadin .  In talking with the patient today, he reports overall doing well from a cardiac perspective since his last office visit.  He goes to a local gym in [place], [place] a few days a week and denies any recent chest pain or dyspnea on exertion with exercise. Was also previously walking at local parks when the weather allowed. He denies any recent orthopnea, PND, pitting edema or palpitations. Wears compression stockings daily. Says that he previously had worsening knee pain with higher doses of Crestor  but has tolerated Crestor  5 mg three times weekly. Also intolerant to Atorvastatin  in the past.  Studies Reviewed:   EKG: EKG is not ordered today.  Cardiac Catheterization: 02/2022   RPAV lesion is  70% stenosed.   3rd RPL lesion is 70% stenosed.   Prox Cx lesion is 20% stenosed.   Dist RCA lesion is 10% stenosed.   Ost 2nd Mrg lesion is 40% stenosed.   Ost 3rd Mrg lesion is 30% stenosed.   Prox RCA to Mid RCA lesion is 20% stenosed.   RPDA lesion is 40% stenosed.   Mid Cx lesion is 50% stenosed.   2nd Mrg lesion is 65% stenosed.   Mid LAD to Dist LAD lesion is 40% stenosed.   Prox LAD lesion is 50% stenosed.   The left ventricular systolic function is normal.   LV end diastolic pressure is normal.   The left ventricular ejection fraction is 50-55% by visual estimate.   There is no mitral valve regurgitation.   The LAD has a calcified moderate proximal stenosis and diffuse moderate disease in the mid vessel. No change from last cath The Circumflex has diffuse moderate mid stenosis. Small to moderate caliber obtuse marginal branch with moderate to severe proximal stenosis.  The RCA is a large dominant, heavily calcified vessel. The right posterolateral artery has diffuse moderate stenosis, unchanged from last cath.  LV systolic function appears to be low normal.  Normal LVEDP   Recommendations: Inferior wall perfusion defect on nuclear stress test consistent with defect seen on scan in 2017. He has moderate CAD with moderately severe disease in the small to moderate caliber obtuse marginal branch. No high grade disease in the RCA or LAD. He is having no chest pain. LV function appears to be low  normal. I would favor medical management of his CAD. If he were to develop angina, could consider PCI of the obtuse marginal branch.   Echocardiogram: 01/2023 IMPRESSIONS     1. Left ventricular ejection fraction, by estimation, is 55 to 60%. The  left ventricle has normal function. The left ventricle has no regional  wall motion abnormalities. Left ventricular diastolic parameters are  indeterminate.   2. Right ventricular systolic function is low normal. The right  ventricular size is  normal. There is normal pulmonary artery systolic  pressure.   3. The mitral valve is abnormal. Mild mitral valve regurgitation. No  evidence of mitral stenosis.   4. The tricuspid valve is abnormal.   5. The aortic valve is tricuspid. There is moderate calcification of the  aortic valve. There is moderate thickening of the aortic valve. Aortic  valve regurgitation is not visualized. No aortic stenosis is present.   6. The inferior vena cava is dilated in size with >50% respiratory  variability, suggesting right atrial pressure of 8 mmHg.   Comparison(s): Previous Echo showed LV EF 40-45% (prior to ViV ICD), with  septal and apical akinesis, severe LAE, moderate RAE, moderate TR, mild  MR.    Risk Assessment/Calculations:    CHA2DS2-VASc Score = 5  This indicates a 7.2% annual risk of stroke. The patient's score is based upon: CHF History: 1 HTN History: 1 Diabetes History: 1 Stroke History: 0 Vascular Disease History: 1 Age Score: 1 Gender Score: 0    Physical Exam:   VS:  BP 138/82   Pulse 70   Ht 5' 11 (1.803 m)   Wt 189 lb (85.7 kg)   SpO2 98%   BMI 26.36 kg/m    Wt Readings from Last 3 Encounters:  05/15/24 189 lb (85.7 kg)  10/27/23 193 lb 12.8 oz (87.9 kg)  03/10/23 190 lb (86.2 kg)     GEN: Well nourished, well developed male appearing in no acute distress NECK: No JVD; No carotid bruits CARDIAC: RRR, no murmurs, rubs, gallops RESPIRATORY:  Clear to auscultation without rales, wheezing or rhonchi  ABDOMEN: Appears non-distended. No obvious abdominal masses. EXTREMITIES: No clubbing or cyanosis. No pitting edema.  Distal pedal pulses are 2+ bilaterally.   Assessment and Plan:   1. Coronary artery disease involving native coronary artery of native heart without angina pectoris - Most recent cardiac catheterization in 02/2022 showed moderate disease along the LAD and RCA. He did have severe disease along a small caliber OM branch and it was recommended  to consider PCI of this if he were to develop angina. - He remains active at baseline and denies any recent anginal symptoms. He is not on ASA given the need for anticoagulation. Continue Coreg  12.5 mg twice daily and Crestor  5 mg MWF. Will add Zetia  as discussed below.   2. Permanent atrial fibrillation (HCC) - He denies any recent palpitations and heart rate is well-controlled in the 70's during today's visit. Continue Coreg  12.5 mg twice daily. - He is on Coumadin  for anticoagulation with no reports of active bleeding. INR is followed by his PCP and was at 2.3 when checked last month.  3. Heart failure with improved ejection fraction (HFimpEF) (HCC) - His EF was previously reduced at 40% in 06/2022 and had normalized to 55 to 60% by repeat echocardiogram in 01/2023. - He denies any recent respiratory issues and appears euvolemic by examination today. Reports his overall energy level significantly improved after undergoing BiV ICD placement. Continue  Coreg  12.5 mg twice daily and Olmesartan -HCTZ 20-12.5 mg daily.  4. Tachycardia-bradycardia syndrome (HCC) - He previously underwent St. Jude PPM placement and did have an upgrade to BiV ICD in 06/2022. Device was functioning normally by most recent device check in 03/2024.  5. Essential hypertension - BP is at 138/82 during today's visit and has been lower at prior office visits. Continue current medical therapy for now with Coreg  12.5 mg twice daily and Olmesartan -HCTZ 20-12.5 mg daily.  6. Hyperlipidemia LDL goal <70 - By review of [facility] DXA, LDL was at 111 when checked in 11/2023. He has been intolerant to high intensity statins and remains on Crestor  5 mg MWF. We reviewed options today including Zetia , PCSK9 inhibitor therapy and Leqvio. He prefers to avoid injectable options but is open to trying Zetia . Will add Zetia  10 mg daily and recheck an FLP in 2 to 3 months.   Signed, Laymon CHRISTELLA Qua, PA-C

## 2024-05-14 NOTE — Telephone Encounter (Signed)
 Spoke with PCP office, did not receive fax. I have refaxed request.

## 2024-05-15 ENCOUNTER — Ambulatory Visit: Attending: Student | Admitting: Student

## 2024-05-15 ENCOUNTER — Encounter: Payer: Self-pay | Admitting: Student

## 2024-05-15 VITALS — BP 138/82 | HR 70 | Ht 71.0 in | Wt 189.0 lb

## 2024-05-15 DIAGNOSIS — I495 Sick sinus syndrome: Secondary | ICD-10-CM | POA: Diagnosis not present

## 2024-05-15 DIAGNOSIS — I4821 Permanent atrial fibrillation: Secondary | ICD-10-CM | POA: Diagnosis not present

## 2024-05-15 DIAGNOSIS — I5032 Chronic diastolic (congestive) heart failure: Secondary | ICD-10-CM | POA: Diagnosis not present

## 2024-05-15 DIAGNOSIS — I2510 Atherosclerotic heart disease of native coronary artery without angina pectoris: Secondary | ICD-10-CM

## 2024-05-15 DIAGNOSIS — E785 Hyperlipidemia, unspecified: Secondary | ICD-10-CM

## 2024-05-15 DIAGNOSIS — I10 Essential (primary) hypertension: Secondary | ICD-10-CM

## 2024-05-15 MED ORDER — EZETIMIBE 10 MG PO TABS
10.0000 mg | ORAL_TABLET | Freq: Every day | ORAL | 3 refills | Status: AC
Start: 2024-05-15 — End: 2024-08-13

## 2024-05-15 NOTE — Patient Instructions (Signed)
 Medication Instructions:   Start Zeita 10 mg Daily   *If you need a refill on your cardiac medications before your next appointment, please call your pharmacy*  Lab Work: Your physician recommends that you return for lab work in: 2-3 months Fasting ( Lipid)   If you have labs (blood work) drawn today and your tests are completely normal, you will receive your results only by: MyChart Message (if you have MyChart) OR A paper copy in the mail If you have any lab test that is abnormal or we need to change your treatment, we will call you to review the results.  Testing/Procedures: NONE   Follow-Up: At [facility], you and your health needs are our priority.  As part of our continuing mission to provide you with exceptional heart care, our providers are all part of one team.  This team includes your primary Cardiologist (physician) and Advanced Practice Providers or APPs (Physician Assistants and Nurse Practitioners) who all work together to provide you with the care you need, when you need it.  Your next appointment:   6 month(s)  Provider:   You may see Maude Emmer, MD or one of the following Advanced Practice Providers on your designated Care Team:   Laymon Qua, PA-C  [place], [place] Olivia [place], [place]     We recommend signing up for the patient portal called MyChart.  Sign up information is provided on this After Visit Summary.  MyChart is used to connect with patients for Virtual Visits (Telemedicine).  Patients are able to view lab/test results, encounter notes, upcoming appointments, etc.  Non-urgent messages can be sent to your provider as well.   To learn more about what you can do with MyChart, go to [email or web address].   Other Instructions Thank you for choosing [facility]!

## 2024-05-21 DIAGNOSIS — L570 Actinic keratosis: Secondary | ICD-10-CM | POA: Diagnosis not present

## 2024-05-21 DIAGNOSIS — C44319 Basal cell carcinoma of skin of other parts of face: Secondary | ICD-10-CM | POA: Diagnosis not present

## 2024-05-21 DIAGNOSIS — X32XXXD Exposure to sunlight, subsequent encounter: Secondary | ICD-10-CM | POA: Diagnosis not present

## 2024-05-21 NOTE — Telephone Encounter (Signed)
 Called PCP, they stated they did not receive fax. 3rd attempt medication clearance sent.

## 2024-05-22 ENCOUNTER — Encounter (INDEPENDENT_AMBULATORY_CARE_PROVIDER_SITE_OTHER): Payer: Self-pay

## 2024-05-22 MED ORDER — PEG 3350-KCL-NA BICARB-NACL 420 G PO SOLR: 4000.0000 mL | Freq: Once | ORAL | 0 refills | Status: AC

## 2024-05-22 NOTE — Addendum Note (Signed)
 Addended by: DALLIE LIONEL RAMAN on: 05/22/2024 02:51 PM   Modules accepted: Orders

## 2024-05-22 NOTE — Telephone Encounter (Signed)
 Called pt to schedule TCS, no answer. LVM.

## 2024-05-22 NOTE — Telephone Encounter (Signed)
 Spoke with pt, scheduled TCS for 05/31/2024 at 11:15am, rx sent to pharmacy, instructions sent through mychart.  Told the patient to contact his PCP in reference to the Lovenox bridge for his procedure.

## 2024-05-22 NOTE — Telephone Encounter (Signed)
 Clearance received and scanned into media. Although they did not give recommendations for lovenox bridge

## 2024-05-22 NOTE — Telephone Encounter (Signed)
 Please schedule him but ask him to reach his PCP to clarify with them how they want the Lovenox bridge and to make sure they send this medication to his pharmacy. Needs to take last dose of Lovenox the night before his procedure. Thanks

## 2024-05-28 ENCOUNTER — Encounter (INDEPENDENT_AMBULATORY_CARE_PROVIDER_SITE_OTHER): Payer: Medicare Other | Admitting: Ophthalmology

## 2024-05-28 DIAGNOSIS — H353132 Nonexudative age-related macular degeneration, bilateral, intermediate dry stage: Secondary | ICD-10-CM | POA: Diagnosis not present

## 2024-05-28 DIAGNOSIS — I10 Essential (primary) hypertension: Secondary | ICD-10-CM | POA: Diagnosis not present

## 2024-05-28 DIAGNOSIS — H43813 Vitreous degeneration, bilateral: Secondary | ICD-10-CM | POA: Diagnosis not present

## 2024-05-28 DIAGNOSIS — H35033 Hypertensive retinopathy, bilateral: Secondary | ICD-10-CM

## 2024-05-29 ENCOUNTER — Other Ambulatory Visit: Payer: Self-pay

## 2024-05-29 ENCOUNTER — Encounter (HOSPITAL_COMMUNITY)
Admission: RE | Admit: 2024-05-29 | Discharge: 2024-05-29 | Disposition: A | Discharge status: Home / Self Care | Source: Ambulatory Visit | Attending: Gastroenterology | Admitting: Gastroenterology

## 2024-05-29 ENCOUNTER — Encounter (HOSPITAL_COMMUNITY): Payer: Self-pay

## 2024-05-29 VITALS — BP 138/82 | HR 70 | Resp 18 | Ht 71.0 in | Wt 189.0 lb

## 2024-05-29 DIAGNOSIS — Z01812 Encounter for preprocedural laboratory examination: Secondary | ICD-10-CM | POA: Insufficient documentation

## 2024-05-29 DIAGNOSIS — E119 Type 2 diabetes mellitus without complications: Secondary | ICD-10-CM | POA: Insufficient documentation

## 2024-05-29 HISTORY — DX: Chronic kidney disease, unspecified: N18.9

## 2024-05-29 LAB — BASIC METABOLIC PANEL WITH GFR
Anion gap: 10 (ref 5–15)
BUN: 19 mg/dL (ref 8–23)
CO2: 27 mmol/L (ref 22–32)
Calcium: 8.9 mg/dL (ref 8.9–10.3)
Chloride: 103 mmol/L (ref 98–111)
Creatinine, Ser: 1.17 mg/dL (ref 0.61–1.24)
GFR, Estimated: >60 mL/min (ref >60)
Glucose, Bld: 113 mg/dL — ABNORMAL HIGH (ref 70–99)
Potassium: 4.6 mmol/L (ref 3.5–5.1)
Sodium: 140 mmol/L (ref 135–145)

## 2024-05-29 NOTE — Patient Instructions (Signed)
 Colonoscopy, [name]   Paul Dunn  05/29/2024     @PREFPERIOPPHARMACY @   Your procedure is scheduled on 05/31/24.  Report to Paul Dunn at [phone] A.M.  Call this number if you have problems the morning of surgery:  [phone]  If you experience any cold or flu symptoms such as cough, fever, chills, shortness of breath, etc. between now and your scheduled surgery, please notify us  at the above number.   Remember:  Do not eat or drink after midnight.  You may drink clear liquids until 0700 .    Clear liquids allowed are:                    Water , Juice (No red color; non-citric and without pulp; diabetics please choose diet or no sugar options), Carbonated beverages (diabetics please choose diet or no sugar options), Clear Tea (No creamer, milk, or cream, including half & half and powdered creamer), Black Coffee Only (No creamer, milk or cream, including half & half and powdered creamer), Plain Jell-O Only (No red color; diabetics please choose no sugar options), Clear Sports drink (No red color; diabetics please choose diet or no sugar options), and Plain Popsicles Only (No red color; diabetics please choose no sugar options)    Take these medicines the morning of surgery with A SIP OF WATER  allupurinol, carvedilol , omeparzole    Do not wear jewelry, make-up or nail polish, including gel polish,  artificial nails, or any other type of covering on natural nails (fingers and  toes).  Do not wear lotions, powders, or perfumes, or deodorant.  Do not shave 48 hours prior to surgery.  Men may shave face and neck.  Do not bring valuables to the hospital.  [facility] is not responsible for any belongings or valuables.  Contacts, dentures or bridgework may not be worn into surgery.  Leave your suitcase in the car.  After surgery it may be brought to your room.  For patients admitted to the hospital, discharge time will be determined by your treatment team.  Patients discharged the day of  surgery will not be allowed to drive home.   Name and phone number of your driver:   family Special instructions:  Follow diet & prep instructions provided by doctor.  Please read over the following fact sheets that you were given. Anesthesia Post-op Instructions and Care and Recovery After Surgery      PATIENT INSTRUCTIONS POST-ANESTHESIA  IMMEDIATELY FOLLOWING SURGERY:  Do not drive or operate machinery for the first twenty four hours after surgery.  Do not make any important decisions for twenty four hours after surgery or while taking narcotic pain medications or sedatives.  If you develop intractable nausea and vomiting or a severe headache please notify your doctor immediately.  FOLLOW-UP:  Please make an appointment with your surgeon as instructed. You do not need to follow up with anesthesia unless specifically instructed to do so.  WOUND CARE INSTRUCTIONS (if applicable):  Keep a dry clean dressing on the anesthesia/puncture wound site if there is drainage.  Once the wound has quit draining you may leave it open to air.  Generally you should leave the bandage intact for twenty four hours unless there is drainage.  If the epidural site drains for more than 36-48 hours please call the anesthesia department.  QUESTIONS?:  Please feel free to call your physician or the hospital operator if you have any questions, and they will be happy to assist you.  Colonoscopy, Adult A colonoscopy is a procedure to look at the entire large intestine. This procedure is done using a long, thin, flexible tube that has a camera on the end. You may have a colonoscopy: As a part of normal colorectal screening. If you have certain symptoms, such as: A low number of red blood cells in your blood (anemia). Diarrhea that does not go away. Pain in your abdomen. Blood in your stool. A colonoscopy can help screen for and diagnose medical problems, including: An abnormal growth of cells or tissue  (tumor). Abnormal growths within the lining of your intestine (polyps). Inflammation. Areas of bleeding. Tell your health care provider about: Any allergies you have. All medicines you are taking, including vitamins, herbs, eye drops, creams, and over-the-counter medicines. Any problems you or family members have had with anesthetic medicines. Any bleeding problems you have. Any surgeries you have had. Any medical conditions you have. Any problems you have had with having bowel movements. Whether you are pregnant or may be pregnant. What are the risks? Generally, this is a safe procedure. However, problems may occur, including: Bleeding. Damage to your intestine. Allergic reactions to medicines given during the procedure. Infection. This is rare. What happens before the procedure? Eating and drinking restrictions Follow instructions from your health care provider about eating or drinking restrictions, which may include: A few days before the procedure: Follow a low-fiber diet. Avoid nuts, seeds, dried fruit, raw fruits, and vegetables. 1-3 days before the procedure: Eat only gelatin dessert or ice pops. Drink only clear liquids, such as water , clear juice, clear broth or bouillon, black coffee or tea, or clear soft drinks or sports drinks. Avoid liquids that contain red or purple dye. The day of the procedure: Do not eat solid foods. You may continue to drink clear liquids until up to 2 hours before the procedure. Do not eat or drink anything starting 2 hours before the procedure, or within the time period that your health care provider recommends. Bowel prep If you were prescribed a bowel prep to take by mouth (orally) to clean out your colon: Take it as told by your health care provider. Starting the day before your procedure, you will need to drink a large amount of liquid medicine. The liquid will cause you to have many bowel movements of loose stool until your stool becomes  almost clear or light green. If your skin or the opening between the buttocks (anus) gets irritated from diarrhea, you may relieve the irritation using: Wipes with medicine in them, such as adult wet wipes with aloe and vitamin E. A product to soothe skin, such as petroleum jelly. If you vomit while drinking the bowel prep: Take a break for up to 60 minutes. Begin the bowel prep again. Call your health care provider if you keep vomiting or you cannot take the bowel prep without vomiting. To clean out your colon, you may also be given: Laxative medicines. These help you have a bowel movement. Instructions for enema use. An enema is liquid medicine injected into your rectum. Medicines Ask your health care provider about: Changing or stopping your regular medicines or supplements. This is especially important if you are taking iron supplements, diabetes medicines, or blood thinners. Taking medicines such as aspirin  and ibuprofen. These medicines can thin your blood. Do not take these medicines unless your health care provider tells you to take them. Taking over-the-counter medicines, vitamins, herbs, and supplements. General instructions Ask your health care provider what steps will  be taken to help prevent infection. These may include washing skin with a germ-killing soap. If you will be going home right after the procedure, plan to have a responsible adult: Take you home from the hospital or clinic. You will not be allowed to drive. Care for you for the time you are told. What happens during the procedure?  An IV will be inserted into one of your veins. You will be given a medicine to make you fall asleep (general anesthetic). You will lie on your side with your knees bent. A lubricant will be put on the tube. Then the tube will be: Inserted into your anus. Gently eased through all parts of your large intestine. Air will be sent into your colon to keep it open. This may cause some  pressure or cramping. Images will be taken with the camera and will appear on a screen. A small tissue sample may be removed to be looked at under a microscope (biopsy). The tissue may be sent to a lab for testing if any signs of problems are found. If small polyps are found, they may be removed and checked for cancer cells. When the procedure is finished, the tube will be removed. The procedure may vary among health care providers and hospitals. What happens after the procedure? Your blood pressure, heart rate, breathing rate, and blood oxygen level will be monitored until you leave the hospital or clinic. You may have a small amount of blood in your stool. You may pass gas and have mild cramping or bloating in your abdomen. This is caused by the air that was used to open your colon during the exam. If you were given a sedative during the procedure, it can affect you for several hours. Do not drive or operate machinery until your health care provider says that it is safe. It is up to you to get the results of your procedure. Ask your health care provider, or the department that is doing the procedure, when your results will be ready. Summary A colonoscopy is a procedure to look at the entire large intestine. Follow instructions from your health care provider about eating and drinking before the procedure. If you were prescribed an oral bowel prep to clean out your colon, take it as told by your health care provider. During the colonoscopy, a flexible tube with a camera on its end is inserted into the anus and then passed into all parts of the large intestine. This information is not intended to replace advice given to you by your health care provider. Make sure you discuss any questions you have with your health care provider. Document Revised: 10/11/2022 Document Reviewed: 04/21/2021 Elsevier Patient Education  2024 [employer]lt, Care After The following information offers guidance on how  to care for yourself after your procedure. Your health care provider may also give you more specific instructions. If you have problems or questions, contact your health care provider. What can I expect after the procedure? After the procedure, it is common to have: A small amount of blood in your stool for 24 hours after the procedure. Some gas. Mild cramping or bloating of your abdomen. Follow these instructions at home: Eating and drinking  Drink enough fluid to keep your urine pale yellow. Follow instructions from your health care provider about eating or drinking restrictions. Resume your normal diet as told by your health care provider. Avoid heavy or fried foods that are hard to digest. Activity Rest as told by your health  care provider. Avoid sitting for a long time without moving. Get up to take short walks every 1-2 hours. This is important to improve blood flow and breathing. Ask for help if you feel weak or unsteady. Return to your normal activities as told by your health care provider. Ask your health care provider what activities are safe for you. Managing cramping and bloating  Try walking around when you have cramps or feel bloated. If directed, apply heat to your abdomen as told by your health care provider. Use the heat source that your health care provider recommends, such as a moist heat pack or a heating pad. Place a towel between your skin and the heat source. Leave the heat on for 20-30 minutes. Remove the heat if your skin turns bright red. This is especially important if you are unable to feel pain, heat, or cold. You have a greater risk of getting burned. General instructions If you were given a sedative during the procedure, it can affect you for several hours. Do not drive or operate machinery until your health care provider says that it is safe. For the first 24 hours after the procedure: Do not sign important documents. Do not drink alcohol. Do your regular  daily activities at a slower pace than normal. Eat soft foods that are easy to digest. Take over-the-counter and prescription medicines only as told by your health care provider. Keep all follow-up visits. This is important. Contact a health care provider if: You have blood in your stool 2-3 days after the procedure. Get help right away if: You have more than a small spotting of blood in your stool. You have large blood clots in your stool. You have swelling of your abdomen. You have nausea or vomiting. You have a fever. You have increasing pain in your abdomen that is not relieved with medicine. These symptoms may be an emergency. Get help right away. Call 911. Do not wait to see if the symptoms will go away. Do not drive yourself to the hospital. Summary After the procedure, it is common to have a small amount of blood in your stool. You may also have mild cramping and bloating of your abdomen. If you were given a sedative during the procedure, it can affect you for several hours. Do not drive or operate machinery until your health care provider says that it is safe. Get help right away if you have a lot of blood in your stool, nausea or vomiting, a fever, or increased pain in your abdomen. This information is not intended to replace advice given to you by your health care provider. Make sure you discuss any questions you have with your health care provider. Document Revised: 10/11/2022 Document Reviewed: 04/21/2021 Elsevier Patient Education  2024 [employer].

## 2024-05-31 ENCOUNTER — Encounter (HOSPITAL_COMMUNITY)
Admission: RE | Disposition: A | Discharge status: Home / Self Care | Payer: Self-pay | Source: Home / Self Care | Attending: Gastroenterology

## 2024-05-31 ENCOUNTER — Ambulatory Visit (HOSPITAL_COMMUNITY): Admitting: Anesthesiology

## 2024-05-31 ENCOUNTER — Ambulatory Visit (HOSPITAL_COMMUNITY)
Admission: RE | Admit: 2024-05-31 | Discharge: 2024-05-31 | Disposition: A | Discharge status: Home / Self Care | Attending: Gastroenterology | Admitting: Gastroenterology

## 2024-05-31 ENCOUNTER — Ambulatory Visit (INDEPENDENT_AMBULATORY_CARE_PROVIDER_SITE_OTHER): Payer: Self-pay | Admitting: Gastroenterology

## 2024-05-31 DIAGNOSIS — J45909 Unspecified asthma, uncomplicated: Secondary | ICD-10-CM | POA: Insufficient documentation

## 2024-05-31 DIAGNOSIS — E7800 Pure hypercholesterolemia, unspecified: Secondary | ICD-10-CM | POA: Insufficient documentation

## 2024-05-31 DIAGNOSIS — N189 Chronic kidney disease, unspecified: Secondary | ICD-10-CM | POA: Diagnosis not present

## 2024-05-31 DIAGNOSIS — K635 Polyp of colon: Secondary | ICD-10-CM

## 2024-05-31 DIAGNOSIS — Z1211 Encounter for screening for malignant neoplasm of colon: Secondary | ICD-10-CM | POA: Insufficient documentation

## 2024-05-31 DIAGNOSIS — I499 Cardiac arrhythmia, unspecified: Secondary | ICD-10-CM | POA: Diagnosis not present

## 2024-05-31 DIAGNOSIS — K648 Other hemorrhoids: Secondary | ICD-10-CM | POA: Insufficient documentation

## 2024-05-31 DIAGNOSIS — I129 Hypertensive chronic kidney disease with stage 1 through stage 4 chronic kidney disease, or unspecified chronic kidney disease: Secondary | ICD-10-CM | POA: Diagnosis not present

## 2024-05-31 DIAGNOSIS — D120 Benign neoplasm of cecum: Secondary | ICD-10-CM | POA: Insufficient documentation

## 2024-05-31 DIAGNOSIS — K219 Gastro-esophageal reflux disease without esophagitis: Secondary | ICD-10-CM | POA: Insufficient documentation

## 2024-05-31 DIAGNOSIS — D122 Benign neoplasm of ascending colon: Secondary | ICD-10-CM | POA: Diagnosis not present

## 2024-05-31 DIAGNOSIS — Z87891 Personal history of nicotine dependence: Secondary | ICD-10-CM | POA: Insufficient documentation

## 2024-05-31 DIAGNOSIS — I4891 Unspecified atrial fibrillation: Secondary | ICD-10-CM | POA: Insufficient documentation

## 2024-05-31 DIAGNOSIS — Z950 Presence of cardiac pacemaker: Secondary | ICD-10-CM | POA: Insufficient documentation

## 2024-05-31 DIAGNOSIS — D123 Benign neoplasm of transverse colon: Secondary | ICD-10-CM | POA: Diagnosis not present

## 2024-05-31 DIAGNOSIS — E1122 Type 2 diabetes mellitus with diabetic chronic kidney disease: Secondary | ICD-10-CM | POA: Insufficient documentation

## 2024-05-31 DIAGNOSIS — I2510 Atherosclerotic heart disease of native coronary artery without angina pectoris: Secondary | ICD-10-CM | POA: Diagnosis not present

## 2024-05-31 DIAGNOSIS — Z860101 Personal history of adenomatous and serrated colon polyps: Secondary | ICD-10-CM

## 2024-05-31 HISTORY — PX: COLONOSCOPY: SHX5424

## 2024-05-31 LAB — CBC
HCT: 35.8 % — ABNORMAL LOW (ref 39.0–52.0)
Hemoglobin: 12 g/dL — ABNORMAL LOW (ref 13.0–17.0)
MCH: 30.3 pg (ref 26.0–34.0)
MCHC: 33.5 g/dL (ref 30.0–36.0)
MCV: 90.4 fL (ref 80.0–100.0)
Platelets: 115 K/uL — ABNORMAL LOW (ref 150–400)
RBC: 3.96 MIL/uL — ABNORMAL LOW (ref 4.22–5.81)
RDW: 14.9 % (ref 11.5–15.5)
WBC: 3.9 K/uL — ABNORMAL LOW (ref 4.0–10.5)
nRBC: 0 % (ref 0.0–0.2)

## 2024-05-31 LAB — HEPATIC FUNCTION PANEL
ALT: 49 U/L — ABNORMAL HIGH (ref 0–44)
AST: 46 U/L — ABNORMAL HIGH (ref 15–41)
Albumin: 3.8 g/dL (ref 3.5–5.0)
Alkaline Phosphatase: 16 U/L — ABNORMAL LOW (ref 38–126)
Bilirubin, Direct: 0.2 mg/dL (ref 0.0–0.2)
Indirect Bilirubin: 0.9 mg/dL (ref 0.3–0.9)
Total Bilirubin: 1.1 mg/dL (ref 0.0–1.2)
Total Protein: 6 g/dL — ABNORMAL LOW (ref 6.5–8.1)

## 2024-05-31 LAB — PROTIME-INR
INR: 1.2 (ref 0.8–1.2)
Prothrombin Time: 15.8 s — ABNORMAL HIGH (ref 11.4–15.2)

## 2024-05-31 LAB — HM COLONOSCOPY

## 2024-05-31 LAB — GLUCOSE, CAPILLARY: Glucose-Capillary: 80 mg/dL (ref 70–99)

## 2024-05-31 SURGERY — COLONOSCOPY
Anesthesia: General

## 2024-05-31 MED ORDER — PROPOFOL 500 MG/50ML IV EMUL
INTRAVENOUS | Status: DC | PRN
  Administered 2024-05-31: 150 mg via INTRAVENOUS
  Administered 2024-05-31: 100 ug/kg/min via INTRAVENOUS

## 2024-05-31 MED ORDER — LACTATED RINGERS IV SOLN
INTRAVENOUS | Status: DC
Start: 2024-05-31 — End: 2024-05-31

## 2024-05-31 NOTE — Anesthesia Procedure Notes (Signed)
 Date/Time: 05/31/2024 10:11 AM  Performed by: Barbarann Verneita RAMAN, CRNAPre-anesthesia Checklist: Patient identified, Emergency Drugs available, Suction available, Timeout performed and Patient being monitored Patient Re-evaluated:Patient Re-evaluated prior to induction Oxygen Delivery Method: Nasal Cannula

## 2024-05-31 NOTE — Anesthesia Preprocedure Evaluation (Signed)
 Anesthesia Evaluation  Patient identified by MRN, date of birth, ID band Patient awake    Reviewed: Allergy & Precautions, H&P , NPO status , Patient's Chart, lab work & pertinent test results, reviewed documented beta blocker date and time   Airway Mallampati: II  TM Distance: >3 FB Neck ROM: full    Dental no notable dental hx.    Pulmonary asthma , former smoker   Pulmonary exam normal breath sounds clear to auscultation       Cardiovascular Exercise Tolerance: Good hypertension, + CAD and + Peripheral Vascular Disease  + dysrhythmias + pacemaker  Rhythm:regular Rate:Normal     Neuro/Psych negative neurological ROS  negative psych ROS   GI/Hepatic Neg liver ROS, hiatal hernia,GERD  ,,  Endo/Other  diabetes    Renal/GU Renal disease  negative genitourinary   Musculoskeletal   Abdominal   Peds  Hematology negative hematology ROS (+)   Anesthesia Other Findings   Reproductive/Obstetrics negative OB ROS                              Anesthesia Physical Anesthesia Plan  ASA: 3  Anesthesia Plan: General   Post-op Pain Management:    Induction:   PONV Risk Score and Plan: Propofol  infusion  Airway Management Planned:   Additional Equipment:   Intra-op Plan:   Post-operative Plan:   Informed Consent: I have reviewed the patients History and Physical, chart, labs and discussed the procedure including the risks, benefits and alternatives for the proposed anesthesia with the patient or authorized representative who has indicated his/her understanding and acceptance.     Dental Advisory Given  Plan Discussed with: CRNA  Anesthesia Plan Comments:         Anesthesia Quick Evaluation

## 2024-05-31 NOTE — H&P (Addendum)
 Paul Dunn is an 73 y.o. male.   Chief Complaint: duodenal NET .  HPI: Paul Dunn is a 73 y.o. 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  Patient underwent colonoscopy in 2020, had multiple polyps removed.  Polyps were tubular adenomas.  The patient denies having any nausea, vomiting, fever, chills, hematochezia, melena, hematemesis, abdominal distention, abdominal pain, diarrhea, jaundice, pruritus or weight loss.  Past Medical History:  Diagnosis Date   Asthma    when I was a child   Atrial fibrillation (HCC)    long standing persistent   Bulging of intervertebral disc between L4 and L5    Chronic kidney disease    Dysrhythmia    GERD (gastroesophageal reflux disease)    Gouty arthritis    H/O hiatal hernia    High cholesterol    History of blood transfusion ~ 1984   History of bronchitis    used to get it twice/year; last time was ~ 2 yr ago (04/12/2012)   HTN (hypertension)    Macular degeneration    Peripheral vascular disease (HCC)    Presence of permanent cardiac pacemaker    Pulmonary nodule    Skin cancer    Tachycardia-bradycardia syndrome (HCC)    s/p PPM implant by Dr Kelsie 04/12/12   Type 2 diabetes mellitus (HCC)     Past Surgical History:  Procedure Laterality Date   BIOPSY  01/18/2022   Procedure: BIOPSY;  Surgeon: Eartha Angelia Sieving, MD;  Location: [facility];  Service: Gastroenterology;;   BIOPSY  03/14/2023   Procedure: BIOPSY;  Surgeon: Eartha Angelia Sieving, MD;  Location: [facility];  Service: Gastroenterology;;   [name] N/A 07/08/2022   Procedure: BIV PPM  UPGRADE;  Surgeon: Nancey Eulas BRAVO, MD;  Location: [facility];  Service: Cardiovascular;  Laterality: N/A;   CARDIAC CATHETERIZATION N/A 06/23/2016    Procedure: Left Heart Cath and Coronary Angiography;  Surgeon: Lonni Hanson, MD;  Location: [facility] INVASIVE CV LAB;  Service: Cardiovascular;  Laterality: N/A;   CATARACT EXTRACTION, BILATERAL     COLONOSCOPY N/A 05/22/2019   Procedure: COLONOSCOPY;  Surgeon: Golda Claudis PENNER, MD;  Location: [facility];  Service: Endoscopy;  Laterality: N/A;  930   ESOPHAGEAL DILATION  01/2012   ESOPHAGOGASTRODUODENOSCOPY (EGD) WITH PROPOFOL  N/A 01/01/2021   Procedure: ESOPHAGOGASTRODUODENOSCOPY (EGD) WITH PROPOFOL ;  Surgeon: Eartha Angelia Sieving, MD;  [facility]: [facility];  Service: Gastroenterology;  Laterality: N/A;  AM   ESOPHAGOGASTRODUODENOSCOPY (EGD) WITH PROPOFOL  N/A 01/18/2022   Procedure: ESOPHAGOGASTRODUODENOSCOPY (EGD) WITH PROPOFOL ;  Surgeon: Eartha Angelia Sieving, MD;  [facility]: [facility];  Service: Gastroenterology;  Laterality: N/A;  935   ESOPHAGOGASTRODUODENOSCOPY (EGD) WITH PROPOFOL  N/A 03/14/2023   Procedure: ESOPHAGOGASTRODUODENOSCOPY (EGD) WITH PROPOFOL ;  Surgeon: Eartha Angelia Sieving, MD;  [facility]: [facility];  Service: Gastroenterology;  Laterality: N/A;  10:45AM;ASA 3   HEMOSTASIS CLIP PLACEMENT  01/01/2021   Procedure: HEMOSTASIS CLIP PLACEMENT;  Surgeon: Eartha Angelia Sieving, MD;  [facility]: [facility];  Service: Gastroenterology;;   LACERATION REPAIR  ~ 1984   left forearm; cut it w/a power saw   LEFT HEART CATH AND CORONARY ANGIOGRAPHY N/A 03/10/2022   Procedure: LEFT HEART CATH AND CORONARY ANGIOGRAPHY;  Surgeon: Verlin Lonni JONETTA, MD;  Location: [facility];  Service: Cardiovascular;  Laterality: N/A;   MELANOMA EXCISION  ~  2011   left posterior shoulder   PACEMAKER INSERTION  04/12/2012   SJM [employer] DR RF pacemaker implanted by Dr Kelsie for tachy/brady syndrome   PERMANENT PACEMAKER INSERTION N/A 04/12/2012   Procedure: PERMANENT PACEMAKER INSERTION;  Surgeon: Lynwood Kelsie, MD;  Location: [facility] CATH LAB;  Service: Cardiovascular;   Laterality: N/A;   POLYPECTOMY  05/22/2019   Procedure: POLYPECTOMY;  Surgeon: Golda Claudis PENNER, MD;  [facility]: [facility];  Service: Endoscopy;;  transverse, cecum,ascending,    POLYPECTOMY  01/01/2021   Procedure: POLYPECTOMY;  Surgeon: Eartha Angelia Sieving, MD;  Location: [facility];  Service: Gastroenterology;;  duodenal gastric   WRIST SURGERY Left     Family History  Problem Relation Age of Onset   Coronary artery disease Mother    Coronary artery disease Father    Ulcers Father    Lung cancer Father    Diabetes Sister    Hypertension Sister    Rheum arthritis Sister    Hypertension Brother    Coronary artery disease Maternal Grandmother    Stroke Maternal Grandfather    Coronary artery disease Paternal Grandmother    Coronary artery disease Paternal Grandfather    Social History:  reports that he quit smoking about 32 years ago. His smoking use included cigarettes. He started smoking about 34 years ago. He has a 2 pack-year smoking history. He quit smokeless tobacco use about 27 years ago.  His smokeless tobacco use included chew. He reports that he does not drink alcohol and does not use drugs.  Allergies:  Allergies  Allergen Reactions   Shellfish Allergy Nausea And Vomiting    Shrimp   Sudafed [Pseudoephedrine] Other (See Comments)    Unknown reaction     Medications Prior to Admission  Medication Sig Dispense Refill   allopurinol  (ZYLOPRIM ) 300 MG tablet Take 300 mg by mouth in the morning.     carvedilol  (COREG ) 12.5 MG tablet TAKE ONE (1) TABLET BY MOUTH TWO (2) TIMES DAILY 180 tablet 3   Cholecalciferol (VITAMIN D) 125 MCG (5000 UT) CAPS Take 5,000 Units by mouth at bedtime.     clonazePAM (KLONOPIN) 1 MG tablet Take 1 mg by mouth at bedtime.     Cyanocobalamin (VITAMIN B 12 PO) Take 500 mcg by mouth daily with supper.     enoxaparin (LOVENOX) 100 MG/ML injection Inject 100 mg into the skin every 12 (twelve) hours.     ezetimibe  (ZETIA ) 10 MG  tablet Take 1 tablet (10 mg total) by mouth daily. 90 tablet 3   famotidine  (PEPCID ) 40 MG tablet TAKE ONE TABLET BY MOUTH AT BEDTIME 90 tablet 0   glipiZIDE  (GLUCOTROL  XL) 2.5 MG 24 hr tablet Take 2.5 mg by mouth in the morning.     metFORMIN  (GLUCOPHAGE ) 1000 MG tablet Take 1,000 mg by mouth 2 (two) times daily with a meal.     Multiple Vitamins-Minerals (PRESERVISION AREDS 2) CAPS Take 1 capsule by mouth in the morning and at bedtime.     olmesartan -hydrochlorothiazide (BENICAR  HCT) 20-12.5 MG tablet Take 1 tablet by mouth in the morning.     Omega-3 Fatty Acids (FISH OIL) 1000 MG CAPS Take 1,000 mg by mouth in the morning and at bedtime.     omeprazole  (PRILOSEC) 40 MG capsule Take one po daily. 90 capsule 3   Zinc 50 MG TABS Take 50 mg by mouth in the morning.     Calcium  Carb-Cholecalciferol 600-500 MG-UNIT CAPS Take 1 tablet by mouth in the morning.  Carboxymeth-Glyc-Polysorb PF (REFRESH OPTIVE MEGA-3) 0.5-1-0.5 % SOLN Place 1 drop into both eyes in the morning.     rosuvastatin  (CRESTOR ) 5 MG tablet TAKE 1 TABLET EVERY MONDAY, WEDNESDAY, &FRIDAY (Patient taking differently: Take 5 mg by mouth every Monday, Wednesday, and Friday at 8 PM.) 38 tablet 2   warfarin (COUMADIN ) 5 MG tablet Take 5-7.5 mg by mouth See admin instructions. Take 1 tablet (5 mg) by mouth on Sundays, Tuesdays, Wednesday and Friday  in the evening. Take 1.5 tablets (7.5 mg) by mouth on Monday,Thursday & Saturday in the evening.      Results for orders placed or performed during the hospital encounter of 05/31/24 (from the past 48 hours)  Glucose, capillary     Status: None   Collection Time: 05/31/24  9:19 AM  Result Value Ref Range   Glucose-Capillary 80 70 - 99 mg/dL    Comment: Glucose reference range applies only to samples taken after fasting for at least 8 hours.   No results found.  Review of Systems  All other systems reviewed and are negative.   Blood pressure (!) 157/86, pulse 72, temperature (!)  97.5 F (36.4 C), temperature source Oral, resp. rate 14, height 5' 11 (1.803 m), weight 85.7 kg, SpO2 100%. Physical Exam  GENERAL: The patient is AO x3, in no acute distress. HEENT: Head is normocephalic and atraumatic. EOMI are intact. Mouth is well hydrated and without lesions. NECK: Supple. No masses LUNGS: Clear to auscultation. No presence of rhonchi/wheezing/rales. Adequate chest expansion HEART: RRR, normal s1 and s2. ABDOMEN: Soft, nontender, no guarding, no peritoneal signs, and nondistended. BS +. No masses. EXTREMITIES: Without any cyanosis, clubbing, rash, lesions or edema. NEUROLOGIC: AOx3, no focal motor deficit. SKIN: no jaundice, no rashes  Assessment/Plan WESTLEY BLASS is a 73 y.o. 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  Will proceed with colonoscopy.  Toribio Eartha Flavors, MD 05/31/2024, 9:52 AM

## 2024-05-31 NOTE — Transfer of Care (Signed)
 Immediate Anesthesia Transfer of Care Note  Patient: Beryl JONETTA Sink  Procedure(s) Performed: COLONOSCOPY  Patient Location: Short Stay  Anesthesia Type:General  Level of Consciousness: drowsy and patient cooperative  Airway & Oxygen Therapy: Patient Spontanous Breathing  Post-op Assessment: Report given to RN and Post -op Vital signs reviewed and stable  Post vital signs: Reviewed and stable  Last Vitals:  Vitals Value Taken Time  BP 96/50 05/31/24 10:45  Temp 36.4 C 05/31/24 10:45  Pulse 74 05/31/24 10:45  Resp 14 05/31/24  1048  SpO2 100 % 05/31/24 10:45    Last Pain:  Vitals:   05/31/24 1045  TempSrc: Oral  PainSc: 0-No pain      Patients Stated Pain Goal: 5 (05/31/24 0911)  Complications: No notable events documented.

## 2024-05-31 NOTE — Op Note (Signed)
 [facility] Patient Name: Paul Dunn Procedure Date: 05/31/2024 8:48 AM MRN: [id number] Date of Birth: [birth year] Attending MD: Toribio Fortune , , [id number] CSN: [id number] Age: 73 Admit Type: Outpatient Procedure:                Colonoscopy Indications:              Surveillance: Personal history of adenomatous                            polyps on last colonoscopy 5 years ago Providers:                Toribio Fortune, Harlene Lips, Gordy Lonni Balm, Technician Referring MD:              Medicines:                Monitored Anesthesia Care Complications:            No immediate complications. Estimated Blood Loss:     Estimated blood loss: none. Procedure:                Pre-Anesthesia Assessment:                           - Prior to the procedure, a History and Physical                            was performed, and patient medications, allergies                            and sensitivities were reviewed. The patient's                            tolerance of previous anesthesia was reviewed.                           - The risks and benefits of the procedure and the                            sedation options and risks were discussed with the                            patient. All questions were answered and informed                            consent was obtained.                           - ASA Grade Assessment: III - A patient with severe                            systemic disease.                           After obtaining informed consent, the colonoscope  was passed under direct vision. Throughout the                            procedure, the patient's blood pressure, pulse, and                            oxygen saturations were monitored continuously. The                            [id number] ([id number]) Peds Colon was introduced                            through the anus and advanced to the the cecum,                             identified by appendiceal orifice and ileocecal                            valve. The colonoscopy was performed without                            difficulty. The patient tolerated the procedure                            well. The quality of the bowel preparation was                            excellent. Scope In: 10:17:26 AM Scope Out: 10:38:36 AM Scope Withdrawal Time: 0 hours 18 minutes 48 seconds  Total Procedure Duration: 0 hours 21 minutes 10 seconds  Findings:      The perianal and digital rectal examinations were normal.      Eleven sessile polyps were found in the transverse colon, ascending       colon and cecum. The polyps were 2 to 6 mm in size. These polyps were       removed with a cold snare. Resection and retrieval were complete.      Non-bleeding internal hemorrhoids were found during retroflexion. The       hemorrhoids were small. Impression:               - Eleven 2 to 6 mm polyps in the transverse colon,                            in the ascending colon and in the cecum, removed                            with a cold snare. Resected and retrieved.                           - Non-bleeding internal hemorrhoids. Moderate Sedation:      Per Anesthesia Care Recommendation:           - Discharge patient to home (ambulatory).                           -  Resume previous diet.                           - Await pathology results.                           - Repeat colonoscopy for surveillance based on                            pathology results.                           -Restart warfarin tonight. Procedure Code(s):        --- Professional ---                           [phone], Colonoscopy, flexible; with removal of                            tumor(s), polyp(s), or other lesion(s) by snare                            technique Diagnosis Code(s):        --- Professional ---                           Z86.010, Personal history of colonic polyps                            D12.3, Benign neoplasm of transverse colon (hepatic                            flexure or splenic flexure)                           D12.2, Benign neoplasm of ascending colon                           D12.0, Benign neoplasm of cecum                           K64.8, Other hemorrhoids CPT copyright 2022 American [employer] Association. All rights reserved. The codes documented in this report are preliminary and upon coder review may  be revised to meet current compliance requirements. Toribio Fortune, MD Toribio Fortune,  05/31/2024 10:52:20 AM This report has been signed electronically. Number of Addenda: 0

## 2024-05-31 NOTE — Discharge Instructions (Signed)
 You are being discharged to home.  Resume your previous diet.  We are waiting for your pathology results.  Your physician has recommended a repeat colonoscopy for surveillance based on pathology results.  Restart warfarin tonight.

## 2024-06-03 ENCOUNTER — Encounter (HOSPITAL_COMMUNITY): Payer: Self-pay | Admitting: Gastroenterology

## 2024-06-03 LAB — CHROMOGRANIN A: Chromogranin A (ng/mL): 220.4 ng/mL — ABNORMAL HIGH (ref 0.0–101.8)

## 2024-06-03 NOTE — Anesthesia Postprocedure Evaluation (Signed)
 Anesthesia Post Note  Patient: Paul Dunn  Procedure(s) Performed: COLONOSCOPY  Patient location during evaluation: Phase II Anesthesia Type: General Level of consciousness: awake Pain management: pain level controlled Vital Signs Assessment: post-procedure vital signs reviewed and stable Respiratory status: spontaneous breathing and respiratory function stable Cardiovascular status: blood pressure returned to baseline and stable Postop Assessment: no headache and no apparent nausea or vomiting Anesthetic complications: no Comments: Late entry   No notable events documented.   Last Vitals:  Vitals:   05/31/24 1045 05/31/24 1104  BP: (!) 96/50 104/62  Pulse: 74   Resp:    Temp: 36.4 C   SpO2: 100%     Last Pain:  Vitals:   05/31/24 1045  TempSrc: Oral  PainSc: 0-No pain                 Yvonna JINNY Bosworth

## 2024-06-04 ENCOUNTER — Encounter (INDEPENDENT_AMBULATORY_CARE_PROVIDER_SITE_OTHER): Payer: Self-pay | Admitting: *Deleted

## 2024-06-04 LAB — SURGICAL PATHOLOGY

## 2024-06-06 DIAGNOSIS — E1142 Type 2 diabetes mellitus with diabetic polyneuropathy: Secondary | ICD-10-CM | POA: Diagnosis not present

## 2024-06-06 DIAGNOSIS — I480 Paroxysmal atrial fibrillation: Secondary | ICD-10-CM | POA: Diagnosis not present

## 2024-06-06 DIAGNOSIS — N182 Chronic kidney disease, stage 2 (mild): Secondary | ICD-10-CM | POA: Diagnosis not present

## 2024-06-06 DIAGNOSIS — Z0000 Encounter for general adult medical examination without abnormal findings: Secondary | ICD-10-CM | POA: Diagnosis not present

## 2024-06-06 DIAGNOSIS — Z125 Encounter for screening for malignant neoplasm of prostate: Secondary | ICD-10-CM | POA: Diagnosis not present

## 2024-06-06 DIAGNOSIS — I10 Essential (primary) hypertension: Secondary | ICD-10-CM | POA: Diagnosis not present

## 2024-06-06 DIAGNOSIS — N181 Chronic kidney disease, stage 1: Secondary | ICD-10-CM | POA: Diagnosis not present

## 2024-06-06 DIAGNOSIS — E1122 Type 2 diabetes mellitus with diabetic chronic kidney disease: Secondary | ICD-10-CM | POA: Diagnosis not present

## 2024-06-06 DIAGNOSIS — E782 Mixed hyperlipidemia: Secondary | ICD-10-CM | POA: Diagnosis not present

## 2024-06-07 NOTE — Progress Notes (Signed)
 1 yr TCS noted in recall Patient result letter mailed procedure note and pathology result faxed to PCP

## 2024-06-10 NOTE — Progress Notes (Unsigned)
 Paul Dunn                                          MRN: [id number]   06/10/2024   The VBCI Quality Team Specialist reviewed this patient medical record for the purposes of chart review for care gap closure. The following were reviewed: abstraction for care gap closure-kidney health evaluation for diabetes:{CHL VBCI QUALITY SPECIALIST CARE GAP SUB MEASURES:[id number]}.    VBCI Quality Team

## 2024-06-13 NOTE — Progress Notes (Signed)
 Remote PPM Transmission

## 2024-06-20 ENCOUNTER — Encounter (INDEPENDENT_AMBULATORY_CARE_PROVIDER_SITE_OTHER): Payer: Self-pay | Admitting: Gastroenterology

## 2024-06-20 ENCOUNTER — Ambulatory Visit (INDEPENDENT_AMBULATORY_CARE_PROVIDER_SITE_OTHER): Admitting: Gastroenterology

## 2024-06-20 VITALS — BP 134/83 | HR 80 | Temp 98.4°F | Ht 71.0 in | Wt 188.9 lb

## 2024-06-20 DIAGNOSIS — R7989 Other specified abnormal findings of blood chemistry: Secondary | ICD-10-CM

## 2024-06-20 DIAGNOSIS — D3A8 Other benign neuroendocrine tumors: Secondary | ICD-10-CM | POA: Diagnosis not present

## 2024-06-20 DIAGNOSIS — R932 Abnormal findings on diagnostic imaging of liver and biliary tract: Secondary | ICD-10-CM

## 2024-06-20 NOTE — Patient Instructions (Addendum)
 Schedule Fibroscan at [facility] Perform blood workup Repeat EGD and colonoscopy in 2026

## 2024-06-20 NOTE — Progress Notes (Unsigned)
 Paul Dunn, M.D. Gastroenterology & Hepatology [facility] [address] [place], [place] [address]  Primary Care Physician: Orpha Yancey LABOR, MD [address] [place] [address]  I will communicate my assessment and recommendations to the referring MD via EMR.  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  The patient was last seen on 11/09/2020. At that time, the patient was continued on pantoprazole  40 mg daily and was scheduled for EGD where he was found to have neuroendocrine tumor and this was removed via endoscopic mucosal resection.  Patient is following today as he was found to have changes on imaging that were suggestive of cirrhosis.    Most recent CgA level was 220 on 05/31/2024.  Patient had PET dotatate test on 04/03/2023.  At that time he was found to have changes suggestive of cirrhosis.  Notably, he had a CT scan of the abdomen on 01/30/2022 also showing changes of cirrhosis.  Due to this he underwent an elastography on 02/28/2022, which showed nodular liver but with normal elastography.  Subsequently, Fibrotest was performed on 03/14/2022 which showed changes of F1-F2 fibrosis.  Patient reports feeling well and denies any complaints.  The patient denies having any nausea, vomiting, fever, chills, hematochezia, melena, hematemesis, abdominal distention, abdominal pain, diarrhea, jaundice, pruritus or weight  loss.  Cirrhosis related questions: Hematemesis/coffee ground emesis: No Abdominal pain: No Abdominal distention/worsening ascitesNo Fever/chills: No Episodes of confusion/disorientation: No Taking diuretics?: HCTZ History of variceal bleeding: No Prior history of banding?: No Prior episodes of SBP: No Last time liver imaging was performed: 02/28/2022 US , nodular liver with no presence of masses. 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 Recommended repeat EGD in 2 years  Index EGD was performed on 01/01/2021, at that time a 6 mm polyp was removed with endoscopic mucosal resection.  Last Colonoscopy: 05/31/24 - Eleven 2 to 6 mm polyps in the transverse colon, in the ascending colon and in the cecum, removed with a cold snare. Resected and retrieved. - Non- bleeding internal hemorrhoids.  Repeat colonoscopy in 1 year  Past Medical History: Past Medical History:  Diagnosis Date   Asthma    when I was a child   Atrial fibrillation (HCC)    long standing persistent   Bulging of intervertebral disc between L4 and L5    Chronic kidney disease    Dysrhythmia    GERD (gastroesophageal reflux disease)    Gouty arthritis    H/O hiatal hernia    High cholesterol    History of blood transfusion ~ 1984   History of bronchitis    used to get it twice/year; last time was ~ 2 yr ago (04/12/2012)   HTN (hypertension)    Macular degeneration    Peripheral vascular disease    Presence of permanent cardiac pacemaker    Pulmonary nodule    Skin cancer    Tachycardia-bradycardia syndrome (HCC)  s/p PPM implant by Dr Kelsie 04/12/12   Type 2 diabetes mellitus [facility])     Past Surgical History: Past Surgical History:  Procedure Laterality Date   BIOPSY  01/18/2022    Procedure: BIOPSY;  Surgeon: Eartha Angelia Sieving, MD;  [facility];  Service: Gastroenterology;;   BIOPSY  03/14/2023   Procedure: BIOPSY;  Surgeon: Eartha Angelia Sieving, MD;  [facility];  Service: Gastroenterology;;   [name] N/A 07/08/2022   Procedure: BIV PPM  UPGRADE;  Surgeon: Nancey Eulas BRAVO, MD;  Location: [facility] INVASIVE CV LAB;  Service: Cardiovascular;  Laterality: N/A;   CARDIAC CATHETERIZATION N/A 06/23/2016   Procedure: Left Heart Cath and Coronary Angiography;  Surgeon: Lonni Hanson, MD;  Location: [facility] INVASIVE CV LAB;  Service: Cardiovascular;  Laterality: N/A;   CATARACT EXTRACTION, BILATERAL     COLONOSCOPY N/A 05/22/2019   Procedure: COLONOSCOPY;  Surgeon: Golda Claudis PENNER, MD;  [facility];  Service: Endoscopy;  Laterality: N/A;  930   COLONOSCOPY N/A 05/31/2024   Procedure: COLONOSCOPY;  Surgeon: Eartha Angelia Sieving, MD;  [facility];  Service: Gastroenterology;  Laterality: N/A;  11:15 am, ASA 3   ESOPHAGEAL DILATION  01/2012   ESOPHAGOGASTRODUODENOSCOPY (EGD) WITH PROPOFOL  N/A 01/01/2021   Procedure: ESOPHAGOGASTRODUODENOSCOPY (EGD) WITH PROPOFOL ;  Surgeon: Eartha Angelia Sieving, MD;  [facility];  Service: Gastroenterology;  Laterality: N/A;  AM   ESOPHAGOGASTRODUODENOSCOPY (EGD) WITH PROPOFOL  N/A 01/18/2022   Procedure: ESOPHAGOGASTRODUODENOSCOPY (EGD) WITH PROPOFOL ;  Surgeon: Eartha Angelia Sieving, MD;  [facility];  Service: Gastroenterology;  Laterality: N/A;  935   ESOPHAGOGASTRODUODENOSCOPY (EGD) WITH PROPOFOL  N/A 03/14/2023   Procedure: ESOPHAGOGASTRODUODENOSCOPY (EGD) WITH PROPOFOL ;  Surgeon: Eartha Angelia Sieving, MD;  [facility];  Service: Gastroenterology;  Laterality: N/A;  10:45AM;ASA 3   HEMOSTASIS CLIP PLACEMENT  01/01/2021   Procedure: HEMOSTASIS CLIP PLACEMENT;  Surgeon: Eartha Angelia Sieving, MD;  [facility];  Service:  Gastroenterology;;   LACERATION REPAIR  ~ 1984   left forearm; cut it w/a power saw   LEFT HEART CATH AND CORONARY ANGIOGRAPHY N/A 03/10/2022   Procedure: LEFT HEART CATH AND CORONARY ANGIOGRAPHY;  Surgeon: Verlin Lonni BIRCH, MD;  Location: [facility] INVASIVE CV LAB;  Service: Cardiovascular;  Laterality: N/A;   MELANOMA EXCISION  ~ 2011   left posterior shoulder   PACEMAKER INSERTION  04/12/2012   SJM [employer] DR RF pacemaker implanted by Dr Kelsie for tachy/brady syndrome   PERMANENT PACEMAKER INSERTION N/A 04/12/2012   Procedure: PERMANENT PACEMAKER INSERTION;  Surgeon: Lynwood Kelsie, MD;  Location: [facility] CATH LAB;  Service: Cardiovascular;  Laterality: N/A;   POLYPECTOMY  05/22/2019   Procedure: POLYPECTOMY;  Surgeon: Golda Claudis PENNER, MD;  [facility];  Service: Endoscopy;;  transverse, cecum,ascending,    POLYPECTOMY  01/01/2021   Procedure: POLYPECTOMY;  Surgeon: Eartha Angelia Sieving, MD;  [facility];  Service: Gastroenterology;;  duodenal gastric   WRIST SURGERY Left     Family History: Family History  Problem Relation Age of Onset   Coronary artery disease Mother    Coronary artery disease Father    Ulcers Father    Lung cancer Father    Diabetes Sister    Hypertension Sister    Rheum arthritis Sister    Hypertension Brother    Coronary artery disease Maternal Grandmother    Stroke Maternal Grandfather    Coronary artery disease Paternal Grandmother    Coronary artery disease  Paternal Grandfather     Social History: Social History   Tobacco Use  Smoking Status Former   Current packs/day: 0.00   Average packs/day: 1 pack/day for 2.0 years (2.0 ttl pk-yrs)   Types: Cigarettes   Start date: 09/12/1989   Quit date: 09/13/1991   Years since quitting: 32.7  Smokeless Tobacco Former   Types: Chew   Quit date: 09/12/1996   Social History   Substance and Sexual Activity  Alcohol Use No   Alcohol/week: 0.0 standard drinks of alcohol   Social  History   Substance and Sexual Activity  Drug Use No    Allergies: Allergies  Allergen Reactions   Shellfish Allergy Nausea And Vomiting    Shrimp   Sudafed [Pseudoephedrine] Other (See Comments)    Unknown reaction     Medications: Current Outpatient Medications  Medication Sig Dispense Refill   allopurinol  (ZYLOPRIM ) 300 MG tablet Take 300 mg by mouth in the morning.     Calcium  Carb-Cholecalciferol 600-500 MG-UNIT CAPS Take 1 tablet by mouth in the morning.     Carboxymeth-Glyc-Polysorb PF (REFRESH OPTIVE MEGA-3) 0.5-1-0.5 % SOLN Place 1 drop into both eyes in the morning.     carvedilol  (COREG ) 12.5 MG tablet TAKE ONE (1) TABLET BY MOUTH TWO (2) TIMES DAILY 180 tablet 3   Cholecalciferol (VITAMIN D) 125 MCG (5000 UT) CAPS Take 5,000 Units by mouth at bedtime.     clonazePAM (KLONOPIN) 1 MG tablet Take 1 mg by mouth at bedtime.     Cyanocobalamin (VITAMIN B 12 PO) Take 500 mcg by mouth daily with supper.     ezetimibe  (ZETIA ) 10 MG tablet Take 1 tablet (10 mg total) by mouth daily. 90 tablet 3   famotidine  (PEPCID ) 40 MG tablet TAKE ONE TABLET BY MOUTH AT BEDTIME 90 tablet 0   glipiZIDE  (GLUCOTROL  XL) 2.5 MG 24 hr tablet Take 2.5 mg by mouth in the morning.     metFORMIN  (GLUCOPHAGE ) 1000 MG tablet Take 1,000 mg by mouth 2 (two) times daily with a meal.     Multiple Vitamins-Minerals (PRESERVISION AREDS 2) CAPS Take 1 capsule by mouth in the morning and at bedtime.     olmesartan -hydrochlorothiazide (BENICAR  HCT) 20-12.5 MG tablet Take 1 tablet by mouth in the morning.     Omega-3 Fatty Acids (FISH OIL) 1000 MG CAPS Take 1,000 mg by mouth in the morning and at bedtime. (Patient taking differently: Take 1,000 mg by mouth 3 (three) times daily.)     omeprazole  (PRILOSEC) 40 MG capsule Take one po daily. 90 capsule 3   rosuvastatin  (CRESTOR ) 5 MG tablet TAKE 1 TABLET EVERY MONDAY, WEDNESDAY, &FRIDAY (Patient taking differently: Take 5 mg by mouth every Monday, Wednesday, and Friday  at 8 PM.) 38 tablet 2   warfarin (COUMADIN ) 5 MG tablet Take 5-7.5 mg by mouth See admin instructions. Take 1 tablet (5 mg) by mouth on Sundays, Tuesdays, Wednesday and Friday  in the evening. Take 1.5 tablets (7.5 mg) by mouth on Monday,Thursday & Saturday in the evening.     Zinc 50 MG TABS Take 50 mg by mouth in the morning.     No current facility-administered medications for this visit.    Review of Systems: GENERAL: negative for malaise, night sweats HEENT: No changes in hearing or vision, no nose bleeds or other nasal problems. NECK: Negative for lumps, goiter, pain and significant neck swelling RESPIRATORY: Negative for cough, wheezing CARDIOVASCULAR: Negative for chest pain, leg swelling, palpitations, orthopnea GI: SEE  HPI MUSCULOSKELETAL: Negative for joint pain or swelling, back pain, and muscle pain. SKIN: Negative for lesions, rash PSYCH: Negative for sleep disturbance, mood disorder and recent psychosocial stressors. HEMATOLOGY Negative for prolonged bleeding, bruising easily, and swollen nodes. ENDOCRINE: Negative for cold or heat intolerance, polyuria, polydipsia and goiter. NEURO: negative for tremor, gait imbalance, syncope and seizures. The remainder of the review of systems is noncontributory.   Physical Exam: BP 134/83 (BP Location: Left Arm, Patient Position: Sitting, Cuff Size: Normal)   Pulse 80   Temp 98.4 F (36.9 C) (Temporal)   Ht 5' 11 (1.803 m)   Wt 188 lb 14.4 oz (85.7 kg)   BMI 26.35 kg/m  GENERAL: The patient is AO x3, in no acute distress. HEENT: Head is normocephalic and atraumatic. EOMI are intact. Mouth is well hydrated and without lesions. NECK: Supple. No masses LUNGS: Clear to auscultation. No presence of rhonchi/wheezing/rales. Adequate chest expansion HEART: RRR, normal s1 and s2. ABDOMEN: Soft, nontender, no guarding, no peritoneal signs, and nondistended. BS +. No masses. EXTREMITIES: Without any cyanosis, clubbing, rash, lesions or  edema. NEUROLOGIC: AOx3, no focal motor deficit. SKIN: no jaundice, no rashes  Imaging/Labs: as above  I personally reviewed and interpreted the available labs, imaging and endoscopic files.  Impression and Plan: Paul Dunn is a 73 y.o. 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  The patient was found to have imaging abnormalities on previous CT scan and PET scan in 2023 and 2024 which were suggestive of cirrhosis.  However, elastography and FibroTest testing have been discordant.  He has no risk factors for chronic liver disease, although this could be related to [facility].  At this point, given the uncertainty of his diagnosis, we will proceed with a FibroScan and ELF test.  We will also evaluate for autoimmune markers and hepatitis A/B/C serologies.  In terms of his neuroendocrine tumor, this appears to be in remission based on most recent PET scan and he is tired of CGA is low, likely driven by PPI use.  Will need to repeat an EGD in 2026 as part of the surveillance of his neuroendocrine tumor.  - Schedule Fibroscan at [facility] - Check  hepatitis A/B/C serologies, ANA, ASMA, IgG, ELF - Repeat EGD and colonoscopy in 2026  All questions were answered.      Paul Fortune, MD Gastroenterology and Hepatology [facility]

## 2024-06-24 DIAGNOSIS — R7989 Other specified abnormal findings of blood chemistry: Secondary | ICD-10-CM | POA: Diagnosis not present

## 2024-06-26 ENCOUNTER — Ambulatory Visit (INDEPENDENT_AMBULATORY_CARE_PROVIDER_SITE_OTHER): Payer: Self-pay | Admitting: Gastroenterology

## 2024-06-26 ENCOUNTER — Encounter (INDEPENDENT_AMBULATORY_CARE_PROVIDER_SITE_OTHER): Payer: Self-pay | Admitting: Gastroenterology

## 2024-06-26 LAB — IGG: IgG (Immunoglobin G), Serum: 565 mg/dL — ABNORMAL LOW (ref 603–1613)

## 2024-06-26 LAB — HCV INTERPRETATION

## 2024-06-26 LAB — HEPATITIS A ANTIBODY, TOTAL: hep A Total Ab: NEGATIVE

## 2024-06-26 LAB — ACUTE VIRAL HEPATITIS (HAV, HBV, HCV)
HCV Ab: NONREACTIVE
Hep A IgM: NEGATIVE
Hep B C IgM: NEGATIVE
Hepatitis B Surface Ag: NEGATIVE

## 2024-06-26 LAB — ANTI-SMOOTH MUSCLE ANTIBODY, IGG: Smooth Muscle Ab: 3 U (ref 0–19)

## 2024-06-26 LAB — ANA: Anti Nuclear Antibody (ANA): NEGATIVE

## 2024-06-26 LAB — HEPATITIS B SURFACE ANTIBODY,QUALITATIVE: Hep B Surface Ab, Qual: NONREACTIVE

## 2024-06-26 LAB — ENHANCED LIVER FIBROSIS (ELF): ELF(TM) Score: 10.29 — AB (ref <9.80)

## 2024-07-02 DIAGNOSIS — L570 Actinic keratosis: Secondary | ICD-10-CM | POA: Diagnosis not present

## 2024-07-02 DIAGNOSIS — X32XXXD Exposure to sunlight, subsequent encounter: Secondary | ICD-10-CM | POA: Diagnosis not present

## 2024-07-02 DIAGNOSIS — C44319 Basal cell carcinoma of skin of other parts of face: Secondary | ICD-10-CM | POA: Diagnosis not present

## 2024-07-03 DIAGNOSIS — I480 Paroxysmal atrial fibrillation: Secondary | ICD-10-CM | POA: Diagnosis not present

## 2024-07-08 ENCOUNTER — Ambulatory Visit (INDEPENDENT_AMBULATORY_CARE_PROVIDER_SITE_OTHER): Payer: Medicare Other

## 2024-07-08 DIAGNOSIS — I4821 Permanent atrial fibrillation: Secondary | ICD-10-CM

## 2024-07-08 LAB — CUP PACEART REMOTE DEVICE CHECK
Battery Remaining Longevity: 83 mo
Battery Remaining Percentage: 82 %
Battery Voltage: 2.99 V
Date Time Interrogation Session: 20251027020009
Implantable Lead Connection Status: 753985
Implantable Lead Connection Status: 753985
Implantable Lead Connection Status: 753985
Implantable Lead Implant Date: 20130801
Implantable Lead Implant Date: 20130801
Implantable Lead Implant Date: 20231027
Implantable Lead Location: 753858
Implantable Lead Location: 753859
Implantable Lead Location: 753860
Implantable Lead Model: 1948
Implantable Pulse Generator Implant Date: 20231027
Lead Channel Impedance Value: 540 Ohm
Lead Channel Impedance Value: 910 Ohm
Lead Channel Pacing Threshold Amplitude: 1.125 V
Lead Channel Pacing Threshold Amplitude: 1.125 V
Lead Channel Pacing Threshold Pulse Width: 0.5 ms
Lead Channel Pacing Threshold Pulse Width: 0.5 ms
Lead Channel Sensing Intrinsic Amplitude: 12 mV
Lead Channel Setting Pacing Amplitude: 2.125
Lead Channel Setting Pacing Amplitude: 2.125
Lead Channel Setting Pacing Pulse Width: 0.5 ms
Lead Channel Setting Pacing Pulse Width: 0.5 ms
Lead Channel Setting Sensing Sensitivity: 2 mV
Pulse Gen Model: 3562
Pulse Gen Serial Number: 8114542

## 2024-07-10 NOTE — Progress Notes (Signed)
 Remote PPM Transmission

## 2024-07-17 ENCOUNTER — Ambulatory Visit: Payer: Self-pay | Admitting: Cardiovascular Disease

## 2024-07-22 DIAGNOSIS — Z6826 Body mass index (BMI) 26.0-26.9, adult: Secondary | ICD-10-CM | POA: Diagnosis not present

## 2024-07-22 DIAGNOSIS — J40 Bronchitis, not specified as acute or chronic: Secondary | ICD-10-CM | POA: Diagnosis not present

## 2024-07-24 DIAGNOSIS — M85852 Other specified disorders of bone density and structure, left thigh: Secondary | ICD-10-CM | POA: Diagnosis not present

## 2024-07-24 DIAGNOSIS — M810 Age-related osteoporosis without current pathological fracture: Secondary | ICD-10-CM | POA: Diagnosis not present

## 2024-07-26 DIAGNOSIS — Z7901 Long term (current) use of anticoagulants: Secondary | ICD-10-CM | POA: Diagnosis not present

## 2024-07-30 DIAGNOSIS — R058 Other specified cough: Secondary | ICD-10-CM | POA: Diagnosis not present

## 2024-07-30 DIAGNOSIS — R052 Subacute cough: Secondary | ICD-10-CM | POA: Diagnosis not present

## 2024-07-30 DIAGNOSIS — R918 Other nonspecific abnormal finding of lung field: Secondary | ICD-10-CM | POA: Diagnosis not present

## 2024-08-07 DIAGNOSIS — K080 Exfoliation of teeth due to systemic causes: Secondary | ICD-10-CM | POA: Diagnosis not present

## 2024-08-13 DIAGNOSIS — R7989 Other specified abnormal findings of blood chemistry: Secondary | ICD-10-CM | POA: Diagnosis not present

## 2024-08-19 ENCOUNTER — Telehealth (INDEPENDENT_AMBULATORY_CARE_PROVIDER_SITE_OTHER): Payer: Self-pay | Admitting: Gastroenterology

## 2024-08-19 NOTE — Telephone Encounter (Signed)
 I called and left a vm, let patient know that I was sending message through My Chart and he may reach out to us  through there with any questions.

## 2024-08-19 NOTE — Telephone Encounter (Signed)
 Hi Crystal,  Please let the patient know the FibroScan did show any abnormalities concerning for cirrhosis.  Therefore, given that the other test were not concerning for this, I do not think he has cirrhosis.  These are great news.  Please share this information with the patient.  Thanks,  Toribio Fortune, MD Gastroenterology and Hepatology [facility] Gastroenterology

## 2024-08-22 DIAGNOSIS — K080 Exfoliation of teeth due to systemic causes: Secondary | ICD-10-CM | POA: Diagnosis not present

## 2024-08-28 DIAGNOSIS — E785 Hyperlipidemia, unspecified: Secondary | ICD-10-CM | POA: Diagnosis not present

## 2024-08-29 DIAGNOSIS — K080 Exfoliation of teeth due to systemic causes: Secondary | ICD-10-CM | POA: Diagnosis not present

## 2024-08-29 LAB — LIPID PANEL
Chol/HDL Ratio: 3 ratio (ref 0.0–5.0)
Cholesterol, Total: 131 mg/dL (ref 100–199)
HDL: 44 mg/dL (ref >39)
LDL Chol Calc (NIH): 61 mg/dL (ref 0–99)
Triglycerides: 155 mg/dL — ABNORMAL HIGH (ref 0–149)
VLDL Cholesterol Cal: 26 mg/dL (ref 5–40)

## 2024-08-30 ENCOUNTER — Ambulatory Visit: Payer: Self-pay | Admitting: Student

## 2024-09-02 DIAGNOSIS — K080 Exfoliation of teeth due to systemic causes: Secondary | ICD-10-CM | POA: Diagnosis not present

## 2024-09-09 DIAGNOSIS — K080 Exfoliation of teeth due to systemic causes: Secondary | ICD-10-CM | POA: Diagnosis not present

## 2024-10-07 ENCOUNTER — Ambulatory Visit (INDEPENDENT_AMBULATORY_CARE_PROVIDER_SITE_OTHER): Payer: Self-pay

## 2024-10-07 DIAGNOSIS — I495 Sick sinus syndrome: Secondary | ICD-10-CM | POA: Diagnosis not present

## 2024-10-08 ENCOUNTER — Ambulatory Visit: Payer: Self-pay | Admitting: Cardiovascular Disease

## 2024-10-08 LAB — CUP PACEART REMOTE DEVICE CHECK
Battery Remaining Longevity: 80 mo
Battery Remaining Percentage: 79 %
Battery Voltage: 2.99 V
Date Time Interrogation Session: 20260126023457
Implantable Lead Connection Status: 753985
Implantable Lead Connection Status: 753985
Implantable Lead Connection Status: 753985
Implantable Lead Implant Date: 20130801
Implantable Lead Implant Date: 20130801
Implantable Lead Implant Date: 20231027
Implantable Lead Location: 753858
Implantable Lead Location: 753859
Implantable Lead Location: 753860
Implantable Lead Model: 1948
Implantable Pulse Generator Implant Date: 20231027
Lead Channel Impedance Value: 510 Ohm
Lead Channel Impedance Value: 800 Ohm
Lead Channel Pacing Threshold Amplitude: 1.125 V
Lead Channel Pacing Threshold Amplitude: 1.125 V
Lead Channel Pacing Threshold Pulse Width: 0.5 ms
Lead Channel Pacing Threshold Pulse Width: 0.5 ms
Lead Channel Sensing Intrinsic Amplitude: 11.4 mV
Lead Channel Setting Pacing Amplitude: 2.125
Lead Channel Setting Pacing Amplitude: 2.125
Lead Channel Setting Pacing Pulse Width: 0.5 ms
Lead Channel Setting Pacing Pulse Width: 0.5 ms
Lead Channel Setting Sensing Sensitivity: 2 mV
Pulse Gen Model: 3562
Pulse Gen Serial Number: 8114542

## 2024-10-10 NOTE — Progress Notes (Signed)
 Remote PPM Transmission

## 2024-11-15 ENCOUNTER — Ambulatory Visit: Admitting: Cardiovascular Disease

## 2024-11-20 ENCOUNTER — Ambulatory Visit: Admitting: Student

## 2025-01-06 ENCOUNTER — Ambulatory Visit: Payer: Self-pay

## 2025-04-07 ENCOUNTER — Ambulatory Visit: Payer: Self-pay

## 2025-05-29 ENCOUNTER — Encounter (INDEPENDENT_AMBULATORY_CARE_PROVIDER_SITE_OTHER): Admitting: Ophthalmology

## 2025-07-07 ENCOUNTER — Ambulatory Visit: Payer: Self-pay

## 2025-10-06 ENCOUNTER — Ambulatory Visit: Payer: Self-pay

## 2026-01-05 ENCOUNTER — Ambulatory Visit: Payer: Self-pay

## 2026-04-06 ENCOUNTER — Ambulatory Visit: Payer: Self-pay

## 2026-07-06 ENCOUNTER — Ambulatory Visit: Payer: Self-pay
